# Patient Record
Sex: Male | Born: 1935 | Race: White | Hispanic: No | Smoking: Former smoker
Health system: Southern US, Community
[De-identification: ages and names within clinical notes are randomized; demographics above are authoritative.]

## PROBLEM LIST (undated history)

## (undated) DIAGNOSIS — R351 Nocturia: Secondary | ICD-10-CM

## (undated) DIAGNOSIS — G4700 Insomnia, unspecified: Secondary | ICD-10-CM

## (undated) DIAGNOSIS — R001 Bradycardia, unspecified: Secondary | ICD-10-CM

## (undated) DIAGNOSIS — K5900 Constipation, unspecified: Secondary | ICD-10-CM

## (undated) DIAGNOSIS — I499 Cardiac arrhythmia, unspecified: Secondary | ICD-10-CM

## (undated) DIAGNOSIS — R200 Anesthesia of skin: Secondary | ICD-10-CM

## (undated) DIAGNOSIS — C801 Malignant (primary) neoplasm, unspecified: Secondary | ICD-10-CM

## (undated) DIAGNOSIS — Z860100 Personal history of colon polyps, unspecified: Secondary | ICD-10-CM

## (undated) DIAGNOSIS — M1990 Unspecified osteoarthritis, unspecified site: Secondary | ICD-10-CM

## (undated) DIAGNOSIS — R531 Weakness: Secondary | ICD-10-CM

## (undated) DIAGNOSIS — M2540 Effusion, unspecified joint: Secondary | ICD-10-CM

## (undated) DIAGNOSIS — G8929 Other chronic pain: Secondary | ICD-10-CM

## (undated) DIAGNOSIS — N400 Enlarged prostate without lower urinary tract symptoms: Secondary | ICD-10-CM

## (undated) DIAGNOSIS — H3530 Unspecified macular degeneration: Secondary | ICD-10-CM

## (undated) DIAGNOSIS — Z87442 Personal history of urinary calculi: Secondary | ICD-10-CM

## (undated) DIAGNOSIS — I10 Essential (primary) hypertension: Secondary | ICD-10-CM

## (undated) DIAGNOSIS — M549 Dorsalgia, unspecified: Secondary | ICD-10-CM

## (undated) HISTORY — PX: PARATHYROIDECTOMY: SHX19

## (undated) HISTORY — PX: OTHER SURGICAL HISTORY: SHX169

## (undated) HISTORY — PX: HERNIA REPAIR: SHX51

## (undated) HISTORY — PX: COLONOSCOPY: SHX174

---

## 2002-10-30 DIAGNOSIS — C801 Malignant (primary) neoplasm, unspecified: Secondary | ICD-10-CM

## 2002-10-30 HISTORY — PX: OTHER SURGICAL HISTORY: SHX169

## 2002-10-30 HISTORY — DX: Malignant (primary) neoplasm, unspecified: C80.1

## 2003-03-16 ENCOUNTER — Encounter: Payer: Self-pay | Admitting: Urology

## 2003-03-16 ENCOUNTER — Encounter: Admission: RE | Admit: 2003-03-16 | Discharge: 2003-03-16 | Payer: Self-pay | Admitting: Urology

## 2003-04-21 ENCOUNTER — Inpatient Hospital Stay (HOSPITAL_COMMUNITY): Admission: RE | Admit: 2003-04-21 | Discharge: 2003-04-25 | Payer: Self-pay | Admitting: Urology

## 2003-04-21 ENCOUNTER — Encounter (INDEPENDENT_AMBULATORY_CARE_PROVIDER_SITE_OTHER): Payer: Self-pay | Admitting: *Deleted

## 2003-10-19 ENCOUNTER — Encounter: Admission: RE | Admit: 2003-10-19 | Discharge: 2003-10-19 | Payer: Self-pay | Admitting: Urology

## 2004-04-07 ENCOUNTER — Ambulatory Visit (HOSPITAL_COMMUNITY): Admission: RE | Admit: 2004-04-07 | Discharge: 2004-04-07 | Payer: Self-pay | Admitting: Urology

## 2004-09-29 ENCOUNTER — Encounter: Admission: RE | Admit: 2004-09-29 | Discharge: 2004-09-29 | Payer: Self-pay | Admitting: Urology

## 2005-04-06 ENCOUNTER — Encounter: Admission: RE | Admit: 2005-04-06 | Discharge: 2005-04-06 | Payer: Self-pay | Admitting: Urology

## 2005-09-20 ENCOUNTER — Encounter: Admission: RE | Admit: 2005-09-20 | Discharge: 2005-09-20 | Payer: Self-pay | Admitting: Urology

## 2006-10-10 ENCOUNTER — Ambulatory Visit (HOSPITAL_COMMUNITY): Admission: RE | Admit: 2006-10-10 | Discharge: 2006-10-10 | Payer: Self-pay | Admitting: Urology

## 2006-10-30 HISTORY — PX: OTHER SURGICAL HISTORY: SHX169

## 2007-02-05 ENCOUNTER — Inpatient Hospital Stay (HOSPITAL_COMMUNITY): Admission: RE | Admit: 2007-02-05 | Discharge: 2007-02-06 | Payer: Self-pay | Admitting: Neurosurgery

## 2007-04-01 ENCOUNTER — Ambulatory Visit (HOSPITAL_COMMUNITY): Admission: RE | Admit: 2007-04-01 | Discharge: 2007-04-01 | Payer: Self-pay | Admitting: Urology

## 2007-09-03 ENCOUNTER — Ambulatory Visit: Payer: Self-pay | Admitting: Internal Medicine

## 2007-09-03 ENCOUNTER — Encounter: Payer: Self-pay | Admitting: Internal Medicine

## 2007-09-03 ENCOUNTER — Ambulatory Visit (HOSPITAL_COMMUNITY): Admission: RE | Admit: 2007-09-03 | Discharge: 2007-09-03 | Payer: Self-pay | Admitting: Internal Medicine

## 2007-09-23 ENCOUNTER — Ambulatory Visit (HOSPITAL_COMMUNITY): Admission: RE | Admit: 2007-09-23 | Discharge: 2007-09-23 | Payer: Self-pay | Admitting: Urology

## 2008-04-10 ENCOUNTER — Ambulatory Visit (HOSPITAL_COMMUNITY): Admission: RE | Admit: 2008-04-10 | Discharge: 2008-04-10 | Payer: Self-pay | Admitting: Urology

## 2011-03-14 NOTE — Op Note (Signed)
NAME:  Joshua Downs, Joshua Downs                  ACCOUNT NO.:  [id number]   MEDICAL RECORD NO.:  [id number]          PATIENT TYPE:  AMB   LOCATION:  DAY                           FACILITY:  [facility]   PHYSICIAN:  R. Roetta Sessions, M.D. DATE OF BIRTH:  February 05, 1931   DATE OF PROCEDURE:  09/03/2007  DATE OF DISCHARGE:                               OPERATIVE REPORT   PROCEDURE PERFORMED:  Colonoscopy with biopsy.   INDICATIONS FOR PROCEDURE:  77 year old gentleman sent over for  screening colonoscopy.  He is devoid of any lower GI lower tract  symptoms.  He has never had his lower GI tract imaged.  There is no  family history of colorectal neoplasia.  Colonoscopy is now being done  as a screening maneuver.  This approach has been discussed with the  patient at length.  The potential risks, benefits and alternatives have  been reviewed, questions answered, he is agreeable.  Please see  documentation in the medical record.   PROCEDURE NOTE:  O2 saturation, blood pressure, pulse rate, and oximetry  were monitored throughout the entire procedure.  Conscious sedation with  Versed 2 mg IV and Demerol 50 mg IV in a single dose.  Instrument Pentax  video chip system.   FINDINGS:  Digital rectal exam revealed no abnormalities.  The prep was  good.  The colonic mucosa was surveyed from the rectosigmoid junction  through the left, transverse, right colon to the appendiceal orifice,  ileocecal valve and cecum.  These structures were well seen and  photographed for the record.  From this level, the scope was slowly  withdrawn.  All previously mentioned mucosal surfaces were again seen.  Careful attention to fold flattening and flexure was utilized to see all  the colonic mucosa.  The patient had scattered narrow mouth sigmoid  diverticula and a 4 mm polyp on a fold in the mid transverse colon which  was cold biopsied/removed.  The remainder of the colonic mucosa appeared  normal.  The scope was pulled down in the  rectum where a thorough  examination of the rectal mucosa including a retroflex view of the anal  verge demonstrated no abnormalities.  The patient tolerated the  procedure well and was reacted in endoscopy.   IMPRESSION:  1. Normal rectum.  2. Sigmoid diverticula (few), diminutive polyp transverse colon      removed as described above, otherwise, normal colon.   RECOMMENDATIONS:  1. Diverticulosis literature provided to Mr. Nordin.  2. Follow up on path.  3. Further recommendations to follow.      Jonathon Bellows, M.D.  Electronically Signed     RMR/MEDQ  D:  09/03/2007  T:  09/03/2007  Job:  [id number]

## 2011-03-17 NOTE — Op Note (Signed)
NAMEMARCK, MCCLENNY NO.:  [id number]   MEDICAL RECORD NO.:  [id number]          PATIENT TYPE:  INP   LOCATION:  3172                         FACILITY:  [facility]   PHYSICIAN:  Danae Orleans. Venetia Maxon, M.D.  DATE OF BIRTH:  1931-06-29   DATE OF PROCEDURE:  02/05/2007  DATE OF DISCHARGE:                               OPERATIVE REPORT   PREOPERATIVE DIAGNOSIS:  L4-5 herniated lumbar disk with left L4  radiculopathy, spondylosis, degenerative disk disease and stenosis with  spondylolisthesis.   FINAL DIAGNOSIS:  L4-5 herniated lumbar disk with left L4 radiculopathy,  spondylosis, degenerative disk disease and stenosis with  spondylolisthesis.   PROCEDURE:  Left L4-5 laminectomy with microdiskectomy and  microdissection.   SURGEON:  Danae Orleans. Venetia Maxon, M.D.   ASSISTANT:  Dr. Channing Mutters and Poteat, RN   ANESTHESIA:  General endotracheal anesthesia.   ESTIMATE BLOOD LOSS:  Minimal.   COMPLICATIONS:  None.   DISPOSITION:  Recovery.   INDICATIONS:  Joshua Downs is a 77 year old man with severe lumbar  spondylosis and stenosis at L3-4 with spondylolisthesis at L3 and L4  with a free fragment disk herniation of what appears to be from the L4-5  level lodged behind the left L4 nerve root.  It was elected take him to  surgery for removal of the free fragment of herniated disk material.   PROCEDURE:  Joshua Downs was brought to the operating room.  Following  satisfactory and uncomplicated induction of general endotracheal  anesthesia and placement of intravenous lines, he was placed in the  prone position on the Wilson frame.  His low back was then prepped and  draped in the usual sterile fashion.  The area of the planned incision  was infiltrated with 0.25% Marcaine and 0.5% lidocaine with 1:200,000  epinephrine.  Incision was made overlying the L4-5 interspace and  carried sharply to the lumbodorsal fascia.  The left side of midline was  incised and subperiosteal dissection was  performed after confirmatory x-  ray was obtained with a marker probe at the L4-5 level.  A  hemisemilaminectomy of L4 was performed with high-speed drill and  completed with Kerrison rongeur.  Ligamentum flavum was detached and  removed in piecemeal fashion.  Microscope was brought into the field and  using the microdissection technique the thecal sac was mobilized and  cephalad to the interspace a large fragment of herniated disk material  was identified and removed using micropituitary.  The L4 nerve root was  felt to be well decompressed.  Hemostasis was assured with Gelfoam  soaked in thrombin.  The spinal canal was palpated and was felt to be  well decompressed.  The wound was infiltrated with Depo-Medrol and  fentanyl.  The self-retaining retractor was removed.  The lumbodorsal  fascia was closed with 0 Vicryl sutures, subcutaneous tissues  reapproximated with 2-0 Vicryl interrupted inverted sutures, and skin  edges reapproximated with interrupted 3-0  Vicryl subcuticular stitch.  The wound was dressed with Dermabond.  The  patient was extubated in the operating room and was taken to the  recovery in stable  satisfactory condition having tolerated the operation  well.  Counts were correct at the end of the case.      Danae Orleans. Venetia Maxon, M.D.  Electronically Signed     JDS/MEDQ  D:  02/05/2007  T:  02/05/2007  Job:  [id number]

## 2011-03-17 NOTE — Op Note (Signed)
NAME:  Joshua Downs, Joshua Downs                            ACCOUNT NO.:  [id number]   MEDICAL RECORD NO.:  [id number]                   PATIENT TYPE:  INP   LOCATION:  0007                                 FACILITY:  [facility]   PHYSICIAN:  Bertram Millard. Dahlstedt, M.D.          DATE OF BIRTH:  September 29, 1931   DATE OF PROCEDURE:  04/21/2003  DATE OF DISCHARGE:                                 OPERATIVE REPORT   PREOPERATIVE DIAGNOSIS:  Left renal mass.   POSTOPERATIVE DIAGNOSIS:  Renal cell carcinoma of left kidney.   PRINCIPAL PROCEDURE:  Hand-assisted laparoscopic left partial nephrectomy.   SURGEON:  Bertram Millard. Dahlstedt, M.D.   FIRST ASSISTANT:  Excell Seltzer. Annabell Howells, M.D.   ANESTHESIA:  General endotracheal.   COMPLICATIONS:  None.   BRIEF HISTORY:  This is a 77 year old male, who has an incidentally  discovered left renal mass.  He has had a staging evaluation which is  negative.  This is a 2-3 cm mass which partially enhances with IV contrast.  The patient was given treatment options and has chosen to have an  extirpative procedure on his left renal mass.  He aware of risks and  complications of surgery including but not limited to bleeding, the need to  open surgical procedure, injury to surrounding organs, infection, possible  need for blood replacement.  The patient understand these and desires to  proceed.   DESCRIPTION OF PROCEDURE:  The patient was given preoperative IV antibiotics  and taken to the operating room where general anesthetic was administered.  He was placed in the supine position on a bean bag.  His anterior abdomen  was prepped and draped.  A Foley catheter had been placed.  A 6 cm  subumbilical incision was made in the midline and carried down through  fascia layers to the peritoneum.  The lap-disk was then placed.  Separate  trocar sites were placed just to the left of the midline in the upper  abdomen, and a small 5 mm trocar was placed in the left upper quadrant.  The  colon was then taken down on the left side along with peritoneal reflection.  Attachments between the colon and the spleen were also taken down.  In this  manner, it was quite easy to palpate the kidney underneath.  The anterior  surface of Gerota's fascia was identified and the colon reflected medially.  The lower pole of the kidney was then identified, and the small mass was  palpable through Gerota's fascia.  The perinephric fat was then excised in  this area over top of the small mass.  The mass was circumscribed down to  the capsule.  The capsule was then scored with electrocautery.  The harmonic  blade was then used to dissect underneath of this mass through the renal  parenchyma.  We tried to take what looked to be adequate margins at this  point.  The tumor  mass was then sent for frozen section.  We then placed  FloSeal on the resected defect.  This provided adequate hemostasis, but the  frozen section came back as renal cell carcinoma with two small areas of  positive margin.  We then dissected deeper underneath of the defect to get  extra tissue.  This was sent for permanent section.  Once again, FloSeal was  placed on the defect.  Hemostasis was adequate.  Tisseal and Surgicel were  placed over top of the defect as well.  There was excellent hemostasis.  No  other bleeding was seen throughout the abdomen.  At this point, a drain was  placed through the 5 mm port in the left upper quadrant.  This was sutured  to the skin with 3-0 nylon.  The trocar sites were inspected from inside  after the trocars were removed.  No bleeding was seen.  The trocar sites  were closed with staples.  The fascial layer was closed in the midline using  a #1 running PDS.  Staples were placed on the skin.  Dry sterile dressings  were placed.   The patient tolerated the procedure well.  He was awakened, extubated, and  taken to the PACU in stable condition.                                                Bertram Millard. Dahlstedt, M.D.    SMD/MEDQ  D:  04/21/2003  T:  04/21/2003  Job:  [id number]   cc:   Excell Seltzer. Annabell Howells, M.D.  [address]  [place]  [place] [address]  Fax: [phone]

## 2011-03-17 NOTE — Discharge Summary (Signed)
Joshua Downs, Joshua Downs                            ACCOUNT NO.:  [id number]   MEDICAL RECORD NO.:  [id number]                   PATIENT TYPE:  INP   LOCATION:  0367                                 FACILITY:  [facility]   PHYSICIAN:  Excell Seltzer. Annabell Howells, M.D.                 DATE OF BIRTH:  [birth year]   DATE OF ADMISSION:  04/21/2003  DATE OF DISCHARGE:  04/25/2003                                 DISCHARGE SUMMARY   HISTORY:  Briefly, Joshua Downs is a 77 year old white male who was originally  seen in consultation by Dr. Wyvonnia Lora for a left renal mass. It was found  on an MRI for back pain. A CT scan demonstrated a 2.4 cm exophytic mass off  the lower pole of the left kidney and after discussing the options Joshua Downs  has agreed to hand assisted laparoscopic partial left nephrectomy. For  additional details of his past medical history and physical exam, please see  the office notes in the chart.   ACCESSORY CLINICAL INFORMATION:  Admission labs, white count 3.9, hemoglobin  14.4, PT 12.7, PTT 30, chemistries within normal limits. Blood type A  positive, antibody negative. EKG marked sinus bradycardia with left anterior  fascicular block.   HOSPITAL COURSE:  On the day of admission, the patient was taken to the  operating room where he underwent a left hand assisted partial nephrectomy  by Dr. Annabell Howells and Dr. Retta Diones. He tolerated the procedure well and was left  with a Foley catheter and a Blake drain. His intraoperative frozen section  on initial specimen showed a positive margin so additional margin was  obtained. His postoperative course was without major complications. His  Foley catheter was discontinued on the first postoperative day. He was able  to void without difficulty. He had some moderate Jackson-Pratt drainage that  was eventually tested for creatinine and found to be negative on the second  postoperative day. On the second postoperative day, he was a little bit  queasy, he had  minimal flatus, his abdomen was somewhat distended but had  positive bowel sounds. His pathology returned grade 2/4 renal cell carcinoma  with the final margin negative. He was given Dulcolax suppositories to  improve his bowel function and his diet which originally had been advanced  back to clears on the morning of the second postoperative day. On the  evening of the second postoperative day, he was made n.p.o. for persistent  ileus. On the third postoperative day, he denied  flatus, he was afebrile,  his abdomen remained somewhat distended but with increased bowel sounds. His  Jackson-Pratt was removed. He was started back on clear liquids and given an  additional Dulcolax. On the evening of the third postoperative day, he was  passing flatus, his abdomen was softer, he was increased to a regular diet.  The following morning he was felt to be ready  for discharge home. At that  time with  a final diagnosis of left renal cell carcinoma. His admission was  complicated by postoperative ileus. His discharge medications consisted of  Vicodin 1-2 p.o. q. 4-6h p.r.n. pain, Dulcolax suppositories. He was  instructed to followup with me in one week.   CONDITION ON DISCHARGE:  Improved.    PROGNOSIS:  Good.   FINAL DIAGNOSES:  Renal cell carcinoma.                                               Excell Seltzer. Annabell Howells, M.D.    JJW/MEDQ  D:  05/07/2003  T:  05/07/2003  Job:  [id number]   cc:   Wyvonnia Lora  [address]  [place]  [place] [address]  Fax: [phone]

## 2012-09-03 ENCOUNTER — Other Ambulatory Visit: Payer: Self-pay | Admitting: Neurosurgery

## 2012-09-03 DIAGNOSIS — M5137 Other intervertebral disc degeneration, lumbosacral region: Secondary | ICD-10-CM

## 2012-09-03 DIAGNOSIS — M47817 Spondylosis without myelopathy or radiculopathy, lumbosacral region: Secondary | ICD-10-CM

## 2012-09-03 DIAGNOSIS — M545 Low back pain: Secondary | ICD-10-CM

## 2012-09-10 ENCOUNTER — Ambulatory Visit
Admission: RE | Admit: 2012-09-10 | Discharge: 2012-09-10 | Disposition: A | Discharge status: Home / Self Care | Payer: Medicare Other | Source: Ambulatory Visit | Attending: Neurosurgery | Admitting: Neurosurgery

## 2012-09-10 DIAGNOSIS — M5137 Other intervertebral disc degeneration, lumbosacral region: Secondary | ICD-10-CM

## 2012-09-10 DIAGNOSIS — M47817 Spondylosis without myelopathy or radiculopathy, lumbosacral region: Secondary | ICD-10-CM

## 2012-09-10 DIAGNOSIS — M545 Low back pain: Secondary | ICD-10-CM

## 2012-09-10 MED ORDER — GADOBENATE DIMEGLUMINE 529 MG/ML IV SOLN
10.0000 mL | Freq: Once | INTRAVENOUS | Status: AC | PRN
  Administered 2012-09-10: 10 mL via INTRAVENOUS

## 2012-09-18 ENCOUNTER — Other Ambulatory Visit: Payer: Self-pay | Admitting: Neurosurgery

## 2012-10-18 ENCOUNTER — Inpatient Hospital Stay (HOSPITAL_COMMUNITY): Admission: RE | Admit: 2012-10-18 | Payer: Medicare Other | Source: Ambulatory Visit

## 2012-10-30 HISTORY — PX: BACK SURGERY: SHX140

## 2012-10-31 ENCOUNTER — Ambulatory Visit (HOSPITAL_COMMUNITY)
Admission: RE | Admit: 2012-10-31 | Discharge: 2012-10-31 | Disposition: A | Discharge status: Home / Self Care | Payer: Medicare Other | Source: Ambulatory Visit | Attending: Neurosurgery | Admitting: Neurosurgery

## 2012-10-31 ENCOUNTER — Encounter (HOSPITAL_COMMUNITY): Payer: Self-pay

## 2012-10-31 ENCOUNTER — Encounter (HOSPITAL_COMMUNITY)
Admission: RE | Admit: 2012-10-31 | Discharge: 2012-10-31 | Disposition: A | Discharge status: Home / Self Care | Payer: Medicare Other | Source: Ambulatory Visit | Attending: Neurosurgery | Admitting: Neurosurgery

## 2012-10-31 DIAGNOSIS — Z01818 Encounter for other preprocedural examination: Secondary | ICD-10-CM | POA: Insufficient documentation

## 2012-10-31 HISTORY — DX: Bradycardia, unspecified: R00.1

## 2012-10-31 HISTORY — DX: Unspecified osteoarthritis, unspecified site: M19.90

## 2012-10-31 HISTORY — DX: Essential (primary) hypertension: I10

## 2012-10-31 HISTORY — DX: Malignant (primary) neoplasm, unspecified: C80.1

## 2012-10-31 LAB — BASIC METABOLIC PANEL
BUN: 21 mg/dL (ref 6–23)
CO2: 27 mEq/L (ref 19–32)
Calcium: 9.2 mg/dL (ref 8.4–10.5)
Chloride: 100 mEq/L (ref 96–112)
Creatinine, Ser: 1.23 mg/dL (ref 0.50–1.35)
GFR calc Af Amer: 63 mL/min — ABNORMAL LOW (ref >90)
GFR calc non Af Amer: 54 mL/min — ABNORMAL LOW (ref >90)
Glucose, Bld: 110 mg/dL — ABNORMAL HIGH (ref 70–99)
Potassium: 4.3 mEq/L (ref 3.5–5.1)
Sodium: 138 mEq/L (ref 135–145)

## 2012-10-31 LAB — TYPE AND SCREEN
ABO/RH(D): A POS
Antibody Screen: NEGATIVE

## 2012-10-31 LAB — CBC
HCT: 42.4 % (ref 39.0–52.0)
Hemoglobin: 14.1 g/dL (ref 13.0–17.0)
MCH: 29.7 pg (ref 26.0–34.0)
MCHC: 33.3 g/dL (ref 30.0–36.0)
MCV: 89.3 fL (ref 78.0–100.0)
Platelets: 217 10*3/uL (ref 150–400)
RBC: 4.75 MIL/uL (ref 4.22–5.81)
RDW: 12.8 % (ref 11.5–15.5)
WBC: 4.7 10*3/uL (ref 4.0–10.5)

## 2012-10-31 LAB — ABO/RH: ABO/RH(D): A POS

## 2012-10-31 LAB — SURGICAL PCR SCREEN
MRSA, PCR: NEGATIVE
Staphylococcus aureus: NEGATIVE

## 2012-10-31 NOTE — Pre-Procedure Instructions (Signed)
20 AALIYAH CANCRO  10/31/2012   Your procedure is scheduled on:  Thursday, January 9th  Report to [facility] at      AM.  Call this number if you have problems the morning of surgery: [phone]   Remember:   Do not eat food or drink:After Midnight.   Take these medicines the morning of surgery with A SIP OF WATER: percocet if needed   Do not wear jewelry, make-up or nail polish.  Do not wear lotions, powders, or perfumes.   Do not shave 48 hours prior to surgery. Men may shave face and neck.  Do not bring valuables to the hospital.  Contacts, dentures or bridgework may not be worn into surgery.  Leave suitcase in the car. After surgery it may be brought to your room.  For patients admitted to the hospital, checkout time is 11:00 AM the day of discharge.   Patients discharged the day of surgery will not be allowed to drive home.    Special Instructions: Shower using CHG 2 nights before surgery and the night before surgery.  If you shower the day of surgery use CHG.  Use special wash - you have one bottle of CHG for all showers.  You should use approximately 1/3 of the bottle for each shower.   Please read over the following fact sheets that you were given: Pain Booklet, Coughing and Deep Breathing, Blood Transfusion Information, MRSA Information and Surgical Site Infection Prevention

## 2012-10-31 NOTE — Progress Notes (Signed)
10/31/12 0929  OBSTRUCTIVE SLEEP APNEA  Have you ever been diagnosed with sleep apnea through a sleep study? No  Do you snore loudly (loud enough to be heard through closed doors)?  1  Do you often feel tired, fatigued, or sleepy during the daytime? 0  Has anyone observed you stop breathing during your sleep? 0  Do you have, or are you being treated for high blood pressure? 1  BMI more than 35 kg/m2? 0  Age over 77 years old? 1  Neck circumference greater than 40 cm/18 inches? 0  Gender: 1  Obstructive Sleep Apnea Score 4   Score 4 or greater  Results sent to PCP

## 2012-10-31 NOTE — Progress Notes (Signed)
Primary Physican - Dr. Margo Common, Jonita Albee Does not have a cardiologist  No previous cardiac testing

## 2012-11-04 MED ORDER — KETOROLAC TROMETHAMINE 30 MG/ML IJ SOLN
INTRAMUSCULAR | Status: AC
  Filled 2012-11-04: qty 1

## 2012-11-06 MED ORDER — CEFAZOLIN SODIUM-DEXTROSE 2-3 GM-% IV SOLR
2.0000 g | INTRAVENOUS | Status: AC
  Administered 2012-11-07 (×2): 2 g via INTRAVENOUS
  Filled 2012-11-06: qty 50

## 2012-11-07 ENCOUNTER — Encounter (HOSPITAL_COMMUNITY): Payer: Self-pay | Admitting: Certified Registered"

## 2012-11-07 ENCOUNTER — Inpatient Hospital Stay (HOSPITAL_COMMUNITY): Payer: Medicare Other | Admitting: Certified Registered"

## 2012-11-07 ENCOUNTER — Inpatient Hospital Stay (HOSPITAL_COMMUNITY): Payer: Medicare Other

## 2012-11-07 ENCOUNTER — Encounter (HOSPITAL_COMMUNITY)
Admission: RE | Disposition: A | Discharge status: Home / Self Care | Payer: Self-pay | Source: Ambulatory Visit | Attending: Neurosurgery

## 2012-11-07 ENCOUNTER — Inpatient Hospital Stay (HOSPITAL_COMMUNITY)
Admission: RE | Admit: 2012-11-07 | Discharge: 2012-11-09 | DRG: 460 | Disposition: A | Discharge status: Home / Self Care | Payer: Medicare Other | Source: Ambulatory Visit | Attending: Neurosurgery | Admitting: Neurosurgery

## 2012-11-07 ENCOUNTER — Encounter (HOSPITAL_COMMUNITY): Payer: Self-pay | Admitting: Vascular Surgery

## 2012-11-07 DIAGNOSIS — M51379 Other intervertebral disc degeneration, lumbosacral region without mention of lumbar back pain or lower extremity pain: Secondary | ICD-10-CM | POA: Diagnosis present

## 2012-11-07 DIAGNOSIS — I10 Essential (primary) hypertension: Secondary | ICD-10-CM | POA: Diagnosis present

## 2012-11-07 DIAGNOSIS — Z85528 Personal history of other malignant neoplasm of kidney: Secondary | ICD-10-CM

## 2012-11-07 DIAGNOSIS — M5137 Other intervertebral disc degeneration, lumbosacral region: Secondary | ICD-10-CM | POA: Diagnosis present

## 2012-11-07 DIAGNOSIS — M4310 Spondylolisthesis, site unspecified: Principal | ICD-10-CM | POA: Diagnosis present

## 2012-11-07 LAB — GLUCOSE, CAPILLARY: Glucose-Capillary: 118 mg/dL — ABNORMAL HIGH (ref 70–99)

## 2012-11-07 SURGERY — POSTERIOR LUMBAR FUSION 2 LEVEL
Anesthesia: General | Site: Back | Wound class: Clean

## 2012-11-07 MED ORDER — HEMOSTATIC AGENTS (NO CHARGE) OPTIME
TOPICAL | Status: DC | PRN
  Administered 2012-11-07 (×2): 1 via TOPICAL

## 2012-11-07 MED ORDER — OXYCODONE HCL 5 MG PO TABS: 5.0000 mg | ORAL_TABLET | Freq: Once | ORAL | Status: DC | PRN

## 2012-11-07 MED ORDER — SODIUM CHLORIDE 0.9 % IV SOLN
INTRAVENOUS | Status: AC
  Filled 2012-11-07: qty 500

## 2012-11-07 MED ORDER — 0.9 % SODIUM CHLORIDE (POUR BTL) OPTIME
TOPICAL | Status: DC | PRN
  Administered 2012-11-07: 1000 mL

## 2012-11-07 MED ORDER — HYDROXYZINE HCL 50 MG/ML IM SOLN: 50.0000 mg | INTRAMUSCULAR | Status: DC | PRN

## 2012-11-07 MED ORDER — CYCLOBENZAPRINE HCL 10 MG PO TABS: 10.0000 mg | ORAL_TABLET | Freq: Three times a day (TID) | ORAL | Status: DC | PRN

## 2012-11-07 MED ORDER — OXYCODONE HCL 5 MG PO TABS
5.0000 mg | ORAL_TABLET | ORAL | Status: DC | PRN
  Administered 2012-11-08 – 2012-11-09 (×2): 5 mg via ORAL
  Filled 2012-11-07 (×2): qty 1

## 2012-11-07 MED ORDER — SODIUM CHLORIDE 0.9 % IJ SOLN: 3.0000 mL | INTRAMUSCULAR | Status: DC | PRN

## 2012-11-07 MED ORDER — OXYCODONE HCL 5 MG/5ML PO SOLN: 5.0000 mg | Freq: Once | ORAL | Status: DC | PRN

## 2012-11-07 MED ORDER — NEOSTIGMINE METHYLSULFATE 1 MG/ML IJ SOLN
INTRAMUSCULAR | Status: DC | PRN
  Administered 2012-11-07: 5 mg via INTRAVENOUS

## 2012-11-07 MED ORDER — PHENOL 1.4 % MT LIQD: 1.0000 | OROMUCOSAL | Status: DC | PRN

## 2012-11-07 MED ORDER — ARTIFICIAL TEARS OP OINT
TOPICAL_OINTMENT | OPHTHALMIC | Status: DC | PRN
  Administered 2012-11-07: 1 via OPHTHALMIC

## 2012-11-07 MED ORDER — ACETAMINOPHEN 10 MG/ML IV SOLN
1000.0000 mg | Freq: Four times a day (QID) | INTRAVENOUS | Status: AC
  Administered 2012-11-07 – 2012-11-08 (×4): 1000 mg via INTRAVENOUS
  Filled 2012-11-07 (×4): qty 100

## 2012-11-07 MED ORDER — ALBUMIN HUMAN 5 % IV SOLN
INTRAVENOUS | Status: DC | PRN
  Administered 2012-11-07 (×2): via INTRAVENOUS

## 2012-11-07 MED ORDER — SODIUM CHLORIDE 0.9 % IV SOLN: 250.0000 mL | INTRAVENOUS | Status: DC

## 2012-11-07 MED ORDER — FENTANYL CITRATE 0.05 MG/ML IJ SOLN
INTRAMUSCULAR | Status: DC | PRN
  Administered 2012-11-07: 150 ug via INTRAVENOUS
  Administered 2012-11-07 (×3): 50 ug via INTRAVENOUS
  Administered 2012-11-07: 100 ug via INTRAVENOUS
  Administered 2012-11-07 (×2): 50 ug via INTRAVENOUS

## 2012-11-07 MED ORDER — BENAZEPRIL HCL 20 MG PO TABS
20.0000 mg | ORAL_TABLET | Freq: Every day | ORAL | Status: DC
  Administered 2012-11-08: 20 mg via ORAL
  Filled 2012-11-07 (×2): qty 1

## 2012-11-07 MED ORDER — ALUM & MAG HYDROXIDE-SIMETH 200-200-20 MG/5ML PO SUSP: 30.0000 mL | Freq: Four times a day (QID) | ORAL | Status: DC | PRN

## 2012-11-07 MED ORDER — ONDANSETRON HCL 4 MG/2ML IJ SOLN: 4.0000 mg | Freq: Once | INTRAMUSCULAR | Status: DC | PRN

## 2012-11-07 MED ORDER — KETOROLAC TROMETHAMINE 30 MG/ML IJ SOLN
30.0000 mg | Freq: Once | INTRAMUSCULAR | Status: AC
  Administered 2012-11-07: 30 mg via INTRAVENOUS

## 2012-11-07 MED ORDER — ACETAMINOPHEN 650 MG RE SUPP: 650.0000 mg | RECTAL | Status: DC | PRN

## 2012-11-07 MED ORDER — KETOROLAC TROMETHAMINE 30 MG/ML IJ SOLN
30.0000 mg | Freq: Four times a day (QID) | INTRAMUSCULAR | Status: DC
  Administered 2012-11-07 – 2012-11-09 (×6): 30 mg via INTRAVENOUS
  Filled 2012-11-07 (×7): qty 1

## 2012-11-07 MED ORDER — HYDROCHLOROTHIAZIDE 12.5 MG PO CAPS
12.5000 mg | ORAL_CAPSULE | Freq: Every day | ORAL | Status: DC
  Administered 2012-11-08: 12.5 mg via ORAL
  Filled 2012-11-07 (×2): qty 1

## 2012-11-07 MED ORDER — EPHEDRINE SULFATE 50 MG/ML IJ SOLN
INTRAMUSCULAR | Status: DC | PRN
  Administered 2012-11-07 (×4): 10 mg via INTRAVENOUS

## 2012-11-07 MED ORDER — SODIUM CHLORIDE 0.9 % IR SOLN
Status: DC | PRN
  Administered 2012-11-07: 11:00:00

## 2012-11-07 MED ORDER — PROPOFOL 10 MG/ML IV BOLUS
INTRAVENOUS | Status: DC | PRN
  Administered 2012-11-07: 100 mg via INTRAVENOUS

## 2012-11-07 MED ORDER — SODIUM CHLORIDE 0.9 % IJ SOLN
3.0000 mL | Freq: Two times a day (BID) | INTRAMUSCULAR | Status: DC
  Administered 2012-11-08: 3 mL via INTRAVENOUS

## 2012-11-07 MED ORDER — BENAZEPRIL-HYDROCHLOROTHIAZIDE 20-12.5 MG PO TABS: 1.0000 | ORAL_TABLET | Freq: Every day | ORAL | Status: DC

## 2012-11-07 MED ORDER — CEFAZOLIN SODIUM-DEXTROSE 2-3 GM-% IV SOLR
INTRAVENOUS | Status: AC
  Filled 2012-11-07: qty 50

## 2012-11-07 MED ORDER — MEPERIDINE HCL 25 MG/ML IJ SOLN: 6.2500 mg | INTRAMUSCULAR | Status: DC | PRN

## 2012-11-07 MED ORDER — MAGNESIUM HYDROXIDE 400 MG/5ML PO SUSP: 30.0000 mL | Freq: Every day | ORAL | Status: DC | PRN

## 2012-11-07 MED ORDER — ROCURONIUM BROMIDE 100 MG/10ML IV SOLN
INTRAVENOUS | Status: DC | PRN
  Administered 2012-11-07 (×2): 10 mg via INTRAVENOUS
  Administered 2012-11-07: 50 mg via INTRAVENOUS

## 2012-11-07 MED ORDER — MENTHOL 3 MG MT LOZG: 1.0000 | LOZENGE | OROMUCOSAL | Status: DC | PRN

## 2012-11-07 MED ORDER — HYDROXYZINE HCL 25 MG PO TABS: 50.0000 mg | ORAL_TABLET | ORAL | Status: DC | PRN

## 2012-11-07 MED ORDER — GLYCOPYRROLATE 0.2 MG/ML IJ SOLN
INTRAMUSCULAR | Status: DC | PRN
  Administered 2012-11-07: 0.6 mg via INTRAVENOUS

## 2012-11-07 MED ORDER — KETOROLAC TROMETHAMINE 30 MG/ML IJ SOLN
INTRAMUSCULAR | Status: AC
  Filled 2012-11-07: qty 1

## 2012-11-07 MED ORDER — LIDOCAINE HCL (CARDIAC) 20 MG/ML IV SOLN
INTRAVENOUS | Status: DC | PRN
  Administered 2012-11-07: 100 mg via INTRAVENOUS

## 2012-11-07 MED ORDER — KCL IN DEXTROSE-NACL 20-5-0.45 MEQ/L-%-% IV SOLN
INTRAVENOUS | Status: DC
  Administered 2012-11-07: 21:00:00 via INTRAVENOUS
  Filled 2012-11-07 (×6): qty 1000

## 2012-11-07 MED ORDER — ACETAMINOPHEN 10 MG/ML IV SOLN
INTRAVENOUS | Status: AC
  Administered 2012-11-07: 1000 mg via INTRAVENOUS
  Filled 2012-11-07: qty 100

## 2012-11-07 MED ORDER — PHENYLEPHRINE HCL 10 MG/ML IJ SOLN
INTRAMUSCULAR | Status: DC | PRN
  Administered 2012-11-07: 40 ug via INTRAVENOUS

## 2012-11-07 MED ORDER — ACETAMINOPHEN 325 MG PO TABS: 650.0000 mg | ORAL_TABLET | ORAL | Status: DC | PRN

## 2012-11-07 MED ORDER — BACITRACIN 50000 UNITS IM SOLR
INTRAMUSCULAR | Status: AC
  Filled 2012-11-07: qty 1

## 2012-11-07 MED ORDER — SODIUM CHLORIDE 0.9 % IV SOLN
INTRAVENOUS | Status: DC | PRN
  Administered 2012-11-07 (×2): via INTRAVENOUS

## 2012-11-07 MED ORDER — BISACODYL 10 MG RE SUPP: 10.0000 mg | Freq: Every day | RECTAL | Status: DC | PRN

## 2012-11-07 MED ORDER — ZOLPIDEM TARTRATE 5 MG PO TABS: 5.0000 mg | ORAL_TABLET | Freq: Every evening | ORAL | Status: DC | PRN

## 2012-11-07 MED ORDER — MORPHINE SULFATE 4 MG/ML IJ SOLN: 4.0000 mg | INTRAMUSCULAR | Status: DC | PRN

## 2012-11-07 MED ORDER — THROMBIN 20000 UNITS EX SOLR
CUTANEOUS | Status: DC | PRN
  Administered 2012-11-07 (×2): via TOPICAL

## 2012-11-07 MED ORDER — MIDAZOLAM HCL 5 MG/5ML IJ SOLN
INTRAMUSCULAR | Status: DC | PRN
  Administered 2012-11-07: 2 mg via INTRAVENOUS

## 2012-11-07 MED ORDER — LACTATED RINGERS IV SOLN
INTRAVENOUS | Status: DC | PRN
  Administered 2012-11-07 (×3): via INTRAVENOUS

## 2012-11-07 MED ORDER — BUPIVACAINE HCL (PF) 0.5 % IJ SOLN
INTRAMUSCULAR | Status: DC | PRN
  Administered 2012-11-07: 30 mL

## 2012-11-07 MED ORDER — HYDROMORPHONE HCL PF 1 MG/ML IJ SOLN: 0.2500 mg | INTRAMUSCULAR | Status: DC | PRN

## 2012-11-07 MED ORDER — LIDOCAINE-EPINEPHRINE 1 %-1:100000 IJ SOLN
INTRAMUSCULAR | Status: DC | PRN
  Administered 2012-11-07: 30 mL

## 2012-11-07 SURGICAL SUPPLY — 88 items
ADH SKN CLS APL DERMABOND .7 (GAUZE/BANDAGES/DRESSINGS) ×1
APL SKNCLS STERI-STRIP NONHPOA (GAUZE/BANDAGES/DRESSINGS)
BAG DECANTER FOR FLEXI CONT (MISCELLANEOUS) ×2 IMPLANT
BENZOIN TINCTURE PRP APPL 2/3 (GAUZE/BANDAGES/DRESSINGS) IMPLANT
BLADE SURG ROTATE 9660 (MISCELLANEOUS) IMPLANT
BRUSH SCRUB EZ PLAIN DRY (MISCELLANEOUS) ×2 IMPLANT
BUR ACORN 6.0 ACORN (BURR) ×2 IMPLANT
BUR ACRON 5.0MM COATED (BURR) ×2 IMPLANT
BUR MATCHSTICK NEURO 3.0 LAGG (BURR) ×2 IMPLANT
CANISTER SUCTION 2500CC (MISCELLANEOUS) ×2 IMPLANT
CAP LCK SPNE (Orthopedic Implant) ×6 IMPLANT
CAP LOCK SPINE RADIUS (Orthopedic Implant) ×6 IMPLANT
CAP LOCKING (Orthopedic Implant) ×12 IMPLANT
CLOTH BEACON ORANGE TIMEOUT ST (SAFETY) ×2 IMPLANT
CONT SPEC 4OZ CLIKSEAL STRL BL (MISCELLANEOUS) ×4 IMPLANT
COVER BACK TABLE 24X17X13 BIG (DRAPES) IMPLANT
COVER TABLE BACK 60X90 (DRAPES) ×2 IMPLANT
CROSSLINK MEDIUM (Orthopedic Implant) ×2 IMPLANT
DERMABOND ADVANCED (GAUZE/BANDAGES/DRESSINGS) ×1
DERMABOND ADVANCED .7 DNX12 (GAUZE/BANDAGES/DRESSINGS) ×1 IMPLANT
DRAPE C-ARM 42X72 X-RAY (DRAPES) ×4 IMPLANT
DRAPE LAPAROTOMY 100X72X124 (DRAPES) ×2 IMPLANT
DRAPE POUCH INSTRU U-SHP 10X18 (DRAPES) ×2 IMPLANT
DRAPE PROXIMA HALF (DRAPES) ×2 IMPLANT
DRSG EMULSION OIL 3X3 NADH (GAUZE/BANDAGES/DRESSINGS) IMPLANT
DURAFORM COLLAGEN 1X1 5-PACK (Neuro Prosthesis/Implant) ×2 IMPLANT
ELECT REM PT RETURN 9FT ADLT (ELECTROSURGICAL) ×2
ELECTRODE REM PT RTRN 9FT ADLT (ELECTROSURGICAL) ×1 IMPLANT
GAUZE SPONGE 4X4 16PLY XRAY LF (GAUZE/BANDAGES/DRESSINGS) ×2 IMPLANT
GLOVE BIO SURGEON STRL SZ8.5 (GLOVE) ×1 IMPLANT
GLOVE BIOGEL PI IND STRL 8 (GLOVE) ×2 IMPLANT
GLOVE BIOGEL PI INDICATOR 8 (GLOVE) ×2
GLOVE ECLIPSE 7.5 STRL STRAW (GLOVE) ×9 IMPLANT
GLOVE EXAM NITRILE LRG STRL (GLOVE) ×2 IMPLANT
GLOVE EXAM NITRILE MD LF STRL (GLOVE) IMPLANT
GLOVE EXAM NITRILE XL STR (GLOVE) IMPLANT
GLOVE EXAM NITRILE XS STR PU (GLOVE) IMPLANT
GLOVE INDICATOR 7.5 STRL GRN (GLOVE) ×1 IMPLANT
GLOVE INDICATOR 8.0 STRL GRN (GLOVE) ×6 IMPLANT
GLOVE OPTIFIT SS 8.0 STRL (GLOVE) ×2 IMPLANT
GLOVE OPTIFIT SS 8.5 STRL (GLOVE) ×2 IMPLANT
GOWN BRE IMP SLV AUR LG STRL (GOWN DISPOSABLE) IMPLANT
GOWN BRE IMP SLV AUR XL STRL (GOWN DISPOSABLE) ×6 IMPLANT
GOWN STRL REIN 2XL LVL4 (GOWN DISPOSABLE) ×4 IMPLANT
KIT BASIN OR (CUSTOM PROCEDURE TRAY) ×2 IMPLANT
KIT INFUSE SMALL (Orthopedic Implant) ×2 IMPLANT
KIT ROOM TURNOVER OR (KITS) ×2 IMPLANT
MILL MEDIUM DISP (BLADE) ×2 IMPLANT
NDL 18GX1X1/2 (RX/OR ONLY) (NEEDLE) ×1 IMPLANT
NDL SPNL 18GX3.5 QUINCKE PK (NEEDLE) ×1 IMPLANT
NEEDLE 18GX1X1/2 (RX/OR ONLY) (NEEDLE) ×2 IMPLANT
NEEDLE BONE MARROW 8GAX6 (NEEDLE) ×1 IMPLANT
NEEDLE SPNL 18GX3.5 QUINCKE PK (NEEDLE) ×2 IMPLANT
NEEDLE SPNL 22GX3.5 QUINCKE BK (NEEDLE) ×2 IMPLANT
NS IRRIG 1000ML POUR BTL (IV SOLUTION) ×4 IMPLANT
PACK LAMINECTOMY NEURO (CUSTOM PROCEDURE TRAY) ×2 IMPLANT
PAD ARMBOARD 7.5X6 YLW CONV (MISCELLANEOUS) ×6 IMPLANT
PATTIES SURGICAL .5 X.5 (GAUZE/BANDAGES/DRESSINGS) ×2 IMPLANT
PATTIES SURGICAL .5 X1 (DISPOSABLE) IMPLANT
PATTIES SURGICAL 1X1 (DISPOSABLE) ×1 IMPLANT
PEEK PLIF AVS 10X25X4 (Peek) ×2 IMPLANT
PEEK PLIF AVS 12X25X4 (Peek) ×4 IMPLANT
PEEK PLIF AVS 8X25X4 DEGREE (Peek) ×4 IMPLANT
ROD 5.5X60MM GREEN (Rod) ×2 IMPLANT
ROD 70MM (Rod) ×4 IMPLANT
ROD SPNL 70X5.5XNS TI RDS (Rod) ×2 IMPLANT
SCREW 5.75X40M (Screw) ×2 IMPLANT
SCREW 5.75X50MM (Screw) ×8 IMPLANT
SPONGE GAUZE 4X4 12PLY (GAUZE/BANDAGES/DRESSINGS) ×2 IMPLANT
SPONGE LAP 4X18 X RAY DECT (DISPOSABLE) ×2 IMPLANT
SPONGE NEURO XRAY DETECT 1X3 (DISPOSABLE) IMPLANT
SPONGE SURGIFOAM ABS GEL 100 (HEMOSTASIS) ×4 IMPLANT
STAPLER SKIN PROX WIDE 3.9 (STAPLE) IMPLANT
STRIP BIOACTIVE VITOSS 25X100X (Neuro Prosthesis/Implant) ×2 IMPLANT
STRIP CLOSURE SKIN 1/2X4 (GAUZE/BANDAGES/DRESSINGS) IMPLANT
SUT PROLENE 6 0 BV (SUTURE) ×4 IMPLANT
SUT VIC AB 1 CT1 18XBRD ANBCTR (SUTURE) ×2 IMPLANT
SUT VIC AB 1 CT1 8-18 (SUTURE) ×4
SUT VIC AB 2-0 CP2 18 (SUTURE) ×4 IMPLANT
SYR 20CC LL (SYRINGE) ×2 IMPLANT
SYR 5ML LL (SYRINGE) IMPLANT
SYR CONTROL 10ML LL (SYRINGE) ×2 IMPLANT
TAPE CLOTH SURG 4X10 WHT LF (GAUZE/BANDAGES/DRESSINGS) ×1 IMPLANT
TOWEL OR 17X24 6PK STRL BLUE (TOWEL DISPOSABLE) ×2 IMPLANT
TOWEL OR 17X26 10 PK STRL BLUE (TOWEL DISPOSABLE) ×2 IMPLANT
TRAP SPECIMEN MUCOUS 40CC (MISCELLANEOUS) IMPLANT
TRAY FOLEY CATH 14FRSI W/METER (CATHETERS) ×2 IMPLANT
WATER STERILE IRR 1000ML POUR (IV SOLUTION) ×2 IMPLANT

## 2012-11-07 NOTE — Anesthesia Postprocedure Evaluation (Signed)
Anesthesia Post Note  Patient: Joshua Downs  Procedure(s) Performed: Procedure(s) (LRB): POSTERIOR LUMBAR FUSION 2 LEVEL (N/A)  Anesthesia type: general  Patient location: PACU  Post pain: Pain level controlled  Post assessment: Patient's Cardiovascular Status Stable  Last Vitals:  Filed Vitals:   11/07/12 1548  Temp: 36.1 C    Post vital signs: Reviewed and stable  Level of consciousness: sedated  Complications: No apparent anesthesia complications

## 2012-11-07 NOTE — Transfer of Care (Signed)
Immediate Anesthesia Transfer of Care Note  Patient: Joshua Downs  Procedure(s) Performed: Procedure(s) (LRB) with comments: POSTERIOR LUMBAR FUSION 2 LEVEL (N/A) - Lumbar three-five laminectomies with Lumbar three-four Lumbar four-five posterior lumbar interbody fusion with interbody prothesis posterolateral arthrodesis and posterior segmentation instrumentation  Patient Location: PACU  Anesthesia Type:General  Level of Consciousness: awake and alert   Airway & Oxygen Therapy: Patient connected to face mask oxygen  Post-op Assessment: Report given to PACU RN  Post vital signs: stable  Complications: No apparent anesthesia complications

## 2012-11-07 NOTE — Anesthesia Preprocedure Evaluation (Addendum)
Anesthesia Evaluation  Patient identified by MRN, date of birth, ID band Patient awake    Reviewed: Allergy & Precautions, H&P , NPO status , Patient's Chart, lab work & pertinent test results, reviewed documented beta blocker date and time   Airway Mallampati: I TM Distance: >3 FB Neck ROM: Full    Dental  (+) Edentulous Upper, Edentulous Lower and Dental Advisory Given   Pulmonary  breath sounds clear to auscultation        Cardiovascular hypertension, Pt. on medications Rhythm:Regular Rate:Normal     Neuro/Psych    GI/Hepatic   Endo/Other    Renal/GU      Musculoskeletal   Abdominal (+)  Abdomen: soft. Bowel sounds: normal.  Peds  Hematology   Anesthesia Other Findings   Reproductive/Obstetrics                         Anesthesia Physical Anesthesia Plan  ASA: II  Anesthesia Plan: General   Post-op Pain Management:    Induction: Intravenous  Airway Management Planned: Oral ETT  Additional Equipment:   Intra-op Plan:   Post-operative Plan: Extubation in OR  Informed Consent: I have reviewed the patients History and Physical, chart, labs and discussed the procedure including the risks, benefits and alternatives for the proposed anesthesia with the patient or authorized representative who has indicated his/her understanding and acceptance.     Plan Discussed with: CRNA and Surgeon  Anesthesia Plan Comments:         Anesthesia Quick Evaluation

## 2012-11-07 NOTE — Anesthesia Procedure Notes (Signed)
Procedure Name: Intubation Date/Time: 11/07/2012 10:27 AM Performed by: Ellin Goodie Pre-anesthesia Checklist: Patient identified, Emergency Drugs available, Suction available, Patient being monitored and Timeout performed Patient Re-evaluated:Patient Re-evaluated prior to inductionOxygen Delivery Method: Circle system utilized Preoxygenation: Pre-oxygenation with 100% oxygen Intubation Type: IV induction Ventilation: Mask ventilation without difficulty Laryngoscope Size: Mac and 3 Grade View: Grade I Tube type: Oral Tube size: 7.5 mm Number of attempts: 1 Airway Equipment and Method: Stylet Placement Confirmation: ETT inserted through vocal cords under direct vision,  positive ETCO2 and breath sounds checked- equal and bilateral Secured at: 22 cm Tube secured with: Tape Dental Injury: Teeth and Oropharynx as per pre-operative assessment

## 2012-11-07 NOTE — Op Note (Signed)
11/07/2012  3:28 PM  PATIENT:  Joshua Downs  79 y.o. male  PRE-OPERATIVE DIAGNOSIS:  lumbar stenosis, lumbar spondylolisthesis, lumbar spondylosis, lumbar degenerative disc disease  POST-OPERATIVE DIAGNOSIS:  lumbar stenosis, lumbar spondylolisthesis, lumbar spondylosis, lumbar degenerative disc disease  PROCEDURE:  Procedure(s): POSTERIOR LUMBAR FUSION 2 LEVEL: L3-L5 decompressive lumbar laminectomy, bilateral L3-4 and L4-5 facetectomy and foraminotomies, with decompression of the exiting L3, L4, and L5 nerve roots bilaterally with decompression beyond that required for interbody arthrodesis, with microdissection and microsurgical technique, bilateral L3-4 and L4-5 posterior lumbar interbody arthrodesis with AVS peek interbody implants and locally harvested morcellized autograft, and the bilateral L3-L5 radius posterior arthrodesis and Vitoss BA with bone marrow aspirate and infuse   SURGEON:  Surgeon(s): Hewitt Shorts, MD Cristi Loron, MD  ASSISTANTS: [name], M.D.   ANESTHESIA:   general  EBL:  Total I/O In: 3510 [I.V.:2500; Blood:510; IV Piggyback:500] Out: 1345 [Urine:145; Blood:1200]  BLOOD ADMINISTERED:510 CC CELLSAVER  COUNT: CORRECT PER NURSING STAFF   DICTATION: Patient was brought to the operating room placed under general endotracheal anesthesia. The patient was turned to prone position, the lumbar region was prepped with Betadine soap and solution and draped in a sterile fashion. The midline was infiltrated with local anesthesia with epinephrine. A midline incision was made, and included the previous midline incision that had been done for a left L4-5 lumbar laminotomy and microdiscectomy Dr. Venetia Maxon 6 years ago, and carried down through the subcutaneous tissue, bipolar cautery and electrocautery were used to maintain hemostasis. Dissection was carried down to the lumbar fascia. The fascia was incised bilaterally and the paraspinal muscles were dissected with a  spinous process and lamina in a subperiosteal fashion. Care was taken typically on the left side at L4-5 to the previous laminotomy, and the presence of scar tissue. An x-ray was taken for localization and the L3, L4, and L5 levels were localized. Dissection was then carried out laterally over the facet complexes and the transverse processes of L3, L4, and L5 were exposed and decorticated. Decompression was performed by performing a L3, L4, and L5 laminectomy, and a bilateral L3-4 and L4-5 facetectomy, and foraminotomy using the high-speed drill and Kerrison punches. The bone from the laminectomy was set aside, and later cleaned of soft tissue, and morselized in a bone mill to later be used as bone graft implant (autograft). Dissection was carried out laterally including the facetectomies and foraminotomies with decompression of the stenotic compression of the L3, L4, and L5 nerve roots. We did find a synovial cyst on the left side at L3-4 and this was removed using microdissection microsurgical technique as well. In the area of the previous left L4-5 laminotomy there was scar tissue adherent to the dura, and a small rent in the dorsal aspect the dura occurred. This was closed using 6-0 Prolene suture in a figure-of-eight fashion. Good approximation the dura was achieved, the patient was Valsalva'd and no CSF leakage was seen, and at the end of the case, a piece of dura foam was placed over the dural repair. Once the decompression of the stenotic compression of the thecal sac and exiting nerve roots was completed we proceeded with the posterior lumbar interbody arthrodesis. The annulus at each level was incised bilaterally and the disc space entered. A thorough discectomy was performed using pituitary rongeurs and curettes. Once the discectomy was completed we began to prepare the endplate surfaces, removing the cartilaginous endplates surface with micro-curettes. We then measured the height of the intervertebral  disc space. We selected a 12 x 25 x 4 AVS peek interbody implants for the L3-4 level, and 8 x 25 x 4 AVS peek interbody implants for the L4-5 level.  The C-arm fluoroscope was then draped and brought in the field and we identified the pedicle entry points bilaterally at the L3, L4, and L5 levels. Each of the 6 pedicles was probed, we aspirated bone marrow aspirate from the vertebral bodies, this was injected over a 10 cc strip of Vitoss BA. Then each of the pedicles was examined with the ball probe, good bony surfaces were found and no bony cuts were found. Each of the pedicles was then tapped with a 5.25 mm tap, again examined with the ball probe good threading was found and no bony cuts were found. We then placed 5.75 by 50 millimeter screws bilaterally at the L3 level, 5.75 by 50 millimeter screws bilaterally at the L4 level, and 5.75 by 40 millimeter screws bilaterally at the L5 level.  We then packed the AVS peek interbody implants with locally harvested morcellized autograft, and then placed the first implant at the right level on the right side, carefully retracting the thecal sac and nerve root medially. We then went back to the left side and packed the midline with additional locally harvested morcellized autograft and then placed a second implant on the left side again retracting the thecal sac and nerve root medially. Then at the L4-5 level, we placed the first implant on the right side, carefully retracting the thecal sac and nerve root medially. We then went back to the left side and packed the midline with additional locally harvested morcellized autograft and then placed a second implant on the left side, again retracting the thecal sac and nerve root medially.   We then packed the lateral gutter over the transverse processes and intertransverse space with Vitoss BA with bone marrow aspirate and infuse. We then selected a 70 mm pre-lordosed rods, they were placed within the screw heads and  secured with locking caps once all 6 locking caps were placed final tightening was performed against a counter torque.  The wound had been irrigated multiple times during the procedure with saline solution and bacitracin solution, good hemostasis was established with a combination of bipolar cautery and Gelfoam with thrombin. Once good hemostasis was confirmed we proceeded with closure paraspinal muscles deep fascia and Scarpa's fascia were closed with interrupted undyed 1 Vicryl sutures the subcutaneous and subcuticular closed with interrupted inverted 2-0 undyed Vicryl sutures the skin edges were approximated with Dermabond.  Following surgery the patient was turned back to the supine position to be reversed and the anesthetic extubated and transferred to the recovery room for further care.    PLAN OF CARE: Admit to inpatient   PATIENT DISPOSITION:  PACU - hemodynamically stable.   Delay start of Pharmacological VTE agent (>24hrs) due to surgical blood loss or risk of bleeding:  yes

## 2012-11-07 NOTE — Preoperative (Signed)
Beta Blockers   Reason not to administer Beta Blockers:Not Applicable 

## 2012-11-07 NOTE — H&P (Signed)
Subjective: Patient is a 79 y.o. male who is admitted for treatment of multilevel multifactorial lumbar stenosis at the L3-4 and L4-5 levels, secondary to dynamic degenerative grade 1 spondylolisthesis at each level associated with advanced degenerative disease and spondylosis. Patient has had difficulties with back pain rating down to the right lower extremity to the buttock, posterior thigh, and leg. He status post a previous left L4-5 lumbar discectomy by Dr. Venetia Maxon in April 2008. He's undergone numerous epidural steroid injections with Dr. Murray Hodgkins. He is admitted now for an L3-L5 decompressive lumbar laminectomy, and a 2 level LIII-4 and L4-5 posterior lumbar interbody arthrodesis with interbody implants and bone graft, and a bilateral L3-L5 posterior lumbar arthrodesis with posterior instrumentation and bone graft.   Past Medical History  Diagnosis Date  . Hypertension   . Bradycardia   . Kidney stones   . Cancer 2004    left kidney  . Arthritis     Past Surgical History  Procedure Date  . Hernia repair   . Parathyroidectomy   . Left kidney portion removed 2004  . Back surgery     Prescriptions prior to admission  Medication Sig Dispense Refill  . benazepril-hydrochlorthiazide (LOTENSIN HCT) 20-12.5 MG per tablet Take 1 tablet by mouth daily.      . beta carotene w/minerals (OCUVITE) tablet Take 1 tablet by mouth daily.      . Multiple Vitamins-Minerals (CENTRUM PO) Take 1 tablet by mouth daily.      . Omega-3 Fatty Acids (FISH OIL) 1200 MG CAPS Take 1 capsule by mouth 2 (two) times daily.      Marland Kitchen oxyCODONE-acetaminophen (PERCOCET/ROXICET) 5-325 MG per tablet Take 1 tablet by mouth 2 (two) times daily as needed. For pain       No Known Allergies  History  Substance Use Topics  . Smoking status: Never Smoker   . Smokeless tobacco: Current User    Types: Chew  . Alcohol Use: No    No family history on file.   Review of Systems A comprehensive review of systems was  negative.  Objective: Vital signs in last 24 hours:    EXAM: Patient is a well-developed well-nourished white male in no acute distress. Lungs are clear to auscultation , the patient has symmetrical respiratory excursion. Heart has a regular rate and rhythm normal S1 and S2 no murmur.   Abdomen is soft nontender nondistended bowel sounds are present. Extremity examination shows no clubbing cyanosis or edema. Musculoskeletal examination shows noticed a patient of the lumbar spinous processes or paralumbar musculature. He is able to flex to degrees, and able to extend to 10. Straight leg raising is negative bilaterally. Motor examination shows 5 over 5 strength in the lower extremities including the iliopsoas quadriceps dorsiflexor extensor hallicus  longus and plantar flexor bilaterally. Sensation is intact to pinprick in the distal lower extremities. Reflexes are symmetrical bilaterally. No pathologic reflexes are present. Patient has a normal gait and stance.   Data Review:CBC    Component Value Date/Time   WBC 4.7 10/31/2012 0948   RBC 4.75 10/31/2012 0948   HGB 14.1 10/31/2012 0948   HCT 42.4 10/31/2012 0948   PLT 217 10/31/2012 0948   MCV 89.3 10/31/2012 0948   MCH 29.7 10/31/2012 0948   MCHC 33.3 10/31/2012 0948   RDW 12.8 10/31/2012 0948                          BMET    Component  Value Date/Time   NA 138 10/31/2012 0948   K 4.3 10/31/2012 0948   CL 100 10/31/2012 0948   CO2 27 10/31/2012 0948   GLUCOSE 110* 10/31/2012 0948   BUN 21 10/31/2012 0948   CREATININE 1.23 10/31/2012 0948   CALCIUM 9.2 10/31/2012 0948   GFRNONAA 54* 10/31/2012 0948   GFRAA 63* 10/31/2012 0948     Assessment/Plan: Patient with low back and right lumbar radicular pain with advanced degeneration at L3-4 and L4-5 levels as outlined above. He is admitted now for lumbar decompression and arthrodesis as outlined above. I've discussed with the patient the nature of his condition, the nature the surgical procedure, the typical length of  surgery, hospital stay, and overall recuperation, the limitations postoperatively, and risks of surgery. I discussed risks including risks of infection, bleeding, possibly need for transfusion, the risk of nerve root dysfunction with pain, weakness, numbness, or paresthesias, the risk of dural tear and CSF leakage and possible need for further surgery, the risk of failure of the arthrodesis and possibly for further surgery, the risk of anesthetic complications including myocardial infarction, stroke, pneumonia, and death. We discussed the need for postoperative immobilization in a lumbar brace. Understanding all this the patient does wish to proceed with surgery and is admitted for such.     Hewitt Shorts, MD 11/07/2012 7:24 AM

## 2012-11-08 NOTE — Progress Notes (Signed)
Filed Vitals:   11/07/12 1730 11/07/12 2000 11/08/12 0000 11/08/12 0400  BP: 147/65 118/58 128/70 92/57  Pulse: 42 50 81 90  Temp: 97.4 F (36.3 C) 97.9 F (36.6 C) 99.3 F (37.4 C) 99 F (37.2 C)  Resp: 16 16 16 16   SpO2: 100% 97% 98% 97%    Patient comfortable, but anxious to get up and mobilize. Dressing clean and dry. Moving lower extremities well.  Plan: We'll begin out of bed today, and progress. We'll discontinue Foley once up and about.  Hewitt Shorts, MD 11/08/2012, 8:10 AM

## 2012-11-08 NOTE — Progress Notes (Signed)
UR COMPLETED  

## 2012-11-09 MED ORDER — OXYCODONE-ACETAMINOPHEN 5-325 MG PO TABS: 1.0000 | ORAL_TABLET | ORAL | Status: DC | PRN

## 2012-11-09 NOTE — Discharge Summary (Signed)
Physician Discharge Summary  Patient ID: Joshua Downs MRN: [id number] DOB/AGE: January 17, 1931 79 y.o.  Admit date: 11/07/2012 Discharge date: 11/09/2012  Admission Diagnoses:  Lumbar stenosis, lumbar spondylolisthesis, lumbar spondylosis, lumbar degenerative disc disease  Discharge Diagnoses:  Lumbar stenosis, lumbar spondylolisthesis, lumbar spondylosis, lumbar degenerative disc disease of  Discharged Condition:  Good  Hospital Course: Patient was admitted underwent an L3-L5 decompressive lumbar laminectomy, and L3-4 and L4-5 PLIF and PLA. Postoperatively he has done well. She's been up and living. He is voiding well. His incision is healing nicely. He is being discharged home with instructions regarding wound care and activities. He is to followup with me in 3 weeks.  Discharge Exam: Blood pressure 92/45, pulse 92, temperature 98.2 F (36.8 C), resp. rate 18, SpO2 96.00%.  Disposition: Home      Medication List     As of 11/09/2012  8:31 AM    TAKE these medications         benazepril-hydrochlorthiazide 20-12.5 MG per tablet   Commonly known as: LOTENSIN HCT   Take 1 tablet by mouth daily.      beta carotene w/minerals tablet   Take 1 tablet by mouth daily.      CENTRUM PO   Take 1 tablet by mouth daily.      Fish Oil 1200 MG Caps   Take 1 capsule by mouth 2 (two) times daily.      oxyCODONE-acetaminophen 5-325 MG per tablet   Commonly known as: PERCOCET/ROXICET   Take 1 tablet by mouth 2 (two) times daily as needed. For pain      oxyCODONE-acetaminophen 5-325 MG per tablet   Commonly known as: PERCOCET/ROXICET   Take 1-2 tablets by mouth every 4 (four) hours as needed.         Signed: Hewitt Shorts, MD 11/09/2012, 8:31 AM

## 2012-11-09 NOTE — Progress Notes (Signed)
Pt given D/C instructions with verbal understanding. Pt D/C'd home via wheelchair @ 1047 per MD order. Rema Fendt, RN

## 2012-11-11 MED FILL — Sodium Chloride Irrigation Soln 0.9%: Qty: 3000 | Status: AC

## 2012-11-11 MED FILL — Heparin Sodium (Porcine) Inj 1000 Unit/ML: INTRAMUSCULAR | Qty: 30 | Status: AC

## 2012-11-11 MED FILL — Sodium Chloride IV Soln 0.9%: INTRAVENOUS | Qty: 1000 | Status: AC

## 2013-10-18 ENCOUNTER — Emergency Department (HOSPITAL_COMMUNITY): Payer: Medicare Other

## 2013-10-18 ENCOUNTER — Emergency Department (HOSPITAL_COMMUNITY)
Admission: EM | Admit: 2013-10-18 | Discharge: 2013-10-18 | Disposition: A | Discharge status: Home / Self Care | Payer: Medicare Other | Attending: Emergency Medicine | Admitting: Emergency Medicine

## 2013-10-18 ENCOUNTER — Encounter (HOSPITAL_COMMUNITY): Payer: Self-pay | Admitting: Emergency Medicine

## 2013-10-18 DIAGNOSIS — Y929 Unspecified place or not applicable: Secondary | ICD-10-CM | POA: Insufficient documentation

## 2013-10-18 DIAGNOSIS — W010XXA Fall on same level from slipping, tripping and stumbling without subsequent striking against object, initial encounter: Secondary | ICD-10-CM | POA: Insufficient documentation

## 2013-10-18 DIAGNOSIS — IMO0002 Reserved for concepts with insufficient information to code with codable children: Secondary | ICD-10-CM | POA: Insufficient documentation

## 2013-10-18 DIAGNOSIS — M129 Arthropathy, unspecified: Secondary | ICD-10-CM | POA: Insufficient documentation

## 2013-10-18 DIAGNOSIS — W19XXXA Unspecified fall, initial encounter: Secondary | ICD-10-CM

## 2013-10-18 DIAGNOSIS — Y9301 Activity, walking, marching and hiking: Secondary | ICD-10-CM | POA: Insufficient documentation

## 2013-10-18 DIAGNOSIS — Z9089 Acquired absence of other organs: Secondary | ICD-10-CM | POA: Insufficient documentation

## 2013-10-18 DIAGNOSIS — I10 Essential (primary) hypertension: Secondary | ICD-10-CM | POA: Insufficient documentation

## 2013-10-18 DIAGNOSIS — Z79899 Other long term (current) drug therapy: Secondary | ICD-10-CM | POA: Insufficient documentation

## 2013-10-18 DIAGNOSIS — J069 Acute upper respiratory infection, unspecified: Secondary | ICD-10-CM | POA: Insufficient documentation

## 2013-10-18 DIAGNOSIS — Z87442 Personal history of urinary calculi: Secondary | ICD-10-CM | POA: Insufficient documentation

## 2013-10-18 DIAGNOSIS — R000 Tachycardia, unspecified: Secondary | ICD-10-CM | POA: Insufficient documentation

## 2013-10-18 DIAGNOSIS — R5381 Other malaise: Secondary | ICD-10-CM | POA: Insufficient documentation

## 2013-10-18 DIAGNOSIS — Z9889 Other specified postprocedural states: Secondary | ICD-10-CM | POA: Insufficient documentation

## 2013-10-18 DIAGNOSIS — Z85528 Personal history of other malignant neoplasm of kidney: Secondary | ICD-10-CM | POA: Insufficient documentation

## 2013-10-18 NOTE — ED Notes (Signed)
Patient transported to X-ray 

## 2013-10-18 NOTE — ED Notes (Addendum)
Pt fell this morning getting the paper, pt states he tripped, denies hitting head, neck tenderness.  Denies any new pain, had back surgery in January with Dr. Jule Ser.  Was unable to get up without assistance, which is unusual for the patient, and he crawled back to house to call son to help him up.  Denies loc, chest pain, and sob  Pt is a little hoarse and states he is getting over laryngitis.

## 2013-10-18 NOTE — ED Notes (Signed)
Pt unsteady on feet, unsteady gait at discharge.  Notified physician, she is already aware and states that patient had good strength on exam and feels is ok for discharge. Patient has family that will stay with him, assist with ambulation.told to use walker at home.

## 2013-10-18 NOTE — ED Provider Notes (Addendum)
CSN: [id number]     Arrival date & time 10/18/13  5621 History   First MD Initiated Contact with Patient 10/18/13 [phone]     Chief Complaint  Patient presents with  . Fall   (Consider location/radiation/quality/duration/timing/severity/associated sxs/prior Treatment) Patient is a 79 y.o. male presenting with fall. The history is provided by the patient.  Fall This is a new (walking out to get the mail and missed the step and fell on the grass.  was unable to get up after fall and states his legs felt weak and he could not stand on them.) problem. The current episode started 1 to 2 hours ago. The problem occurs constantly. The problem has not changed since onset.Pertinent negatives include no chest pain, no abdominal pain and no shortness of breath. Associated symptoms comments: Unable to stand on his legs because they are weak since the fall. Mild pain in the lower back.  Denies head injury or LOC. The symptoms are aggravated by walking. The symptoms are relieved by rest and lying down. He has tried nothing for the symptoms. The treatment provided no relief.    Past Medical History  Diagnosis Date  . Hypertension   . Bradycardia   . Kidney stones   . Cancer 2004    left kidney  . Arthritis    Past Surgical History  Procedure Laterality Date  . Hernia repair    . Parathyroidectomy    . Left kidney portion removed  2004  . Back surgery    . Back surgery  10/2012   No family history on file. History  Substance Use Topics  . Smoking status: Never Smoker   . Smokeless tobacco: Current User    Types: Chew  . Alcohol Use: No    Review of Systems  Constitutional: Negative for fever and chills.  HENT: Positive for congestion, rhinorrhea, sore throat and voice change.   Respiratory: Positive for cough. Negative for shortness of breath.   Cardiovascular: Negative for chest pain.  Gastrointestinal: Negative for abdominal pain.  Neurological: Negative for dizziness and numbness.  All  other systems reviewed and are negative.    Allergies  Review of patient's allergies indicates no known allergies.  Home Medications   Current Outpatient Rx  Name  Route  Sig  Dispense  Refill  . benazepril-hydrochlorthiazide (LOTENSIN HCT) 20-12.5 MG per tablet   Oral   Take 1 tablet by mouth daily.         . beta carotene w/minerals (OCUVITE) tablet   Oral   Take 1 tablet by mouth daily.         . Multiple Vitamins-Minerals (CENTRUM PO)   Oral   Take 1 tablet by mouth daily.         . Omega-3 Fatty Acids (FISH OIL) 1200 MG CAPS   Oral   Take 1 capsule by mouth 2 (two) times daily.         Marland Kitchen oxyCODONE-acetaminophen (PERCOCET/ROXICET) 5-325 MG per tablet   Oral   Take 1 tablet by mouth 2 (two) times daily as needed. For pain         . oxyCODONE-acetaminophen (PERCOCET/ROXICET) 5-325 MG per tablet   Oral   Take 1-2 tablets by mouth every 4 (four) hours as needed.   60 tablet   0    BP 121/76  Pulse 105  Temp(Src) 98.6 F (37 C) (Oral)  Resp 22  Ht 6' (1.829 m)  Wt 195 lb (88.451 kg)  BMI 26.44 kg/m2  SpO2 93% Physical Exam  Nursing note and vitals reviewed. Constitutional: He is oriented to person, place, and time. He appears well-developed and well-nourished. No distress.  HENT:  Head: Normocephalic and atraumatic.  Mouth/Throat: Oropharynx is clear and moist.  Hoarse voice  Eyes: Conjunctivae and EOM are normal. Pupils are equal, round, and reactive to light.  Neck: Normal range of motion. Neck supple.  Cardiovascular: Regular rhythm and intact distal pulses.  Tachycardia present.   No murmur heard. Pulmonary/Chest: Breath sounds normal. Tachypnea noted. No respiratory distress. He has no wheezes. He has no rales.  Abdominal: Soft. He exhibits no distension. There is no tenderness. There is no rebound and no guarding.  Musculoskeletal: Normal range of motion. He exhibits no edema.       Lumbar back: He exhibits tenderness. He exhibits no  deformity and normal pulse.       Back:  Tenderness to palpation over the paralumbar muscles and must lateral to the lumbar spine.  Pain reproduced in the back when lifting the legs  Neurological: He is alert and oriented to person, place, and time. He has normal strength.  5/5 muscle strength in bilateral legs.  Pt has normal sensation in bilateral legs.    Skin: Skin is warm and dry. No rash noted. No erythema.  Psychiatric: He has a normal mood and affect. His behavior is normal.    ED Course  Procedures (including critical care time) Labs Review Labs Reviewed - No data to display Imaging Review Dg Chest 2 View  10/18/2013   CLINICAL DATA:  Chest congestion.  Low back injury.  EXAM: CHEST  2 VIEW  COMPARISON:  10/31/2012.  FINDINGS: The cardiopericardial silhouette is enlarged. There is no pulmonary edema or airspace disease. Monitoring leads project over the chest. Left glenohumeral osteoarthritis is present. Aortic arch atherosclerosis.  IMPRESSION: Cardiomegaly without failure.   Electronically Signed   By: Andreas Newport M.D.   On: 10/18/2013 09:06   Dg Lumbar Spine Complete  10/18/2013   CLINICAL DATA:  Fall. Tingling in the left leg. Lumbar fusion January 2014.  EXAM: LUMBAR SPINE - COMPLETE 4+ VIEW  COMPARISON:  06/03/2013.  FINDINGS: No change in the L3 through L5 posterior lumbar interbody fusion hardware. Interbody bone graft markers appear similar. The alignment is unchanged. Mild L1-L2 and L2-L3 degenerative disc disease. Lumbosacral alignment appears unchanged as well.  IMPRESSION: Postoperative changes of L3 through L5 PLIF.  No acute abnormality.   Electronically Signed   By: Andreas Newport M.D.   On: 10/18/2013 09:03    EKG Interpretation   None       MDM   1. Fall, initial encounter   2. Acute URI     Patient with a prior history of spinal fusion who has been having some back problems lately his and tingling down the left leg presents today after a mechanical  fall. He states he missed the step when he was walking out to get the mail and fell onto the grass. He states since that time his legs felt weak and he could not stand on his own. He is complaining of mild lower back pain but no excruciating pain in the hips, knees or any other joints. He denies LOC or head injury. On exam patient can lift both legs and has normal strength in his legs. He does have some mild lower back pain reproduced with lifting his legs. He is neurovascularly intact. Pt took hydrocodone this a.m. And states he does not  need anything for pain currently.  Pt had an appt to see Dr. Jule Ser Monday due to some worsening pain with paresthesias going down the left leg. Lumbar films to evaluate L-spine and insure no compression fractures or other acute issues from the fall pending.  Secondly patient has had URI symptoms for the last week with intermittent productive cough, sore throat and hoarse voice. He has an intermittent cough on exam with mild tachypnea and oxygen saturations of 93%. He denies shortness of breath or wheezing.  Chest x-ray pending.  9:19 AM Plain films neg.  Pt able to stand at bedside and states he feels better still feels a little shaky with standing but has a walker he can use at home.  Gwyneth Sprout, MD 10/18/13 1610  Gwyneth Sprout, MD 10/18/13 [phone]

## 2013-10-27 ENCOUNTER — Other Ambulatory Visit (HOSPITAL_COMMUNITY): Payer: Self-pay | Admitting: Neurosurgery

## 2013-10-27 DIAGNOSIS — IMO0002 Reserved for concepts with insufficient information to code with codable children: Secondary | ICD-10-CM

## 2013-10-29 ENCOUNTER — Ambulatory Visit (HOSPITAL_COMMUNITY)
Admission: RE | Admit: 2013-10-29 | Discharge: 2013-10-29 | Disposition: A | Discharge status: Home / Self Care | Payer: Medicare Other | Source: Ambulatory Visit | Attending: Neurosurgery | Admitting: Neurosurgery

## 2013-10-29 DIAGNOSIS — M48061 Spinal stenosis, lumbar region without neurogenic claudication: Secondary | ICD-10-CM | POA: Insufficient documentation

## 2013-10-29 DIAGNOSIS — M545 Low back pain, unspecified: Secondary | ICD-10-CM | POA: Insufficient documentation

## 2013-10-29 DIAGNOSIS — M5126 Other intervertebral disc displacement, lumbar region: Secondary | ICD-10-CM | POA: Insufficient documentation

## 2013-10-29 DIAGNOSIS — Z981 Arthrodesis status: Secondary | ICD-10-CM | POA: Insufficient documentation

## 2013-10-29 DIAGNOSIS — IMO0002 Reserved for concepts with insufficient information to code with codable children: Secondary | ICD-10-CM

## 2013-10-29 MED ORDER — GADOBENATE DIMEGLUMINE 529 MG/ML IV SOLN
18.0000 mL | Freq: Once | INTRAVENOUS | Status: AC | PRN
  Administered 2013-10-29: 18 mL via INTRAVENOUS

## 2013-10-30 HISTORY — PX: BACK SURGERY: SHX140

## 2013-10-31 LAB — POCT I-STAT CREATININE: Creatinine, Ser: 1.3 mg/dL (ref 0.50–1.35)

## 2013-12-09 ENCOUNTER — Other Ambulatory Visit: Payer: Self-pay | Admitting: Neurosurgery

## 2013-12-15 ENCOUNTER — Encounter (HOSPITAL_COMMUNITY): Payer: Self-pay | Admitting: Pharmacy Technician

## 2013-12-18 ENCOUNTER — Encounter (HOSPITAL_COMMUNITY)
Admission: RE | Admit: 2013-12-18 | Discharge: 2013-12-18 | Disposition: A | Discharge status: Home / Self Care | Payer: Medicare Other | Source: Ambulatory Visit | Attending: Neurosurgery | Admitting: Neurosurgery

## 2013-12-18 ENCOUNTER — Encounter (HOSPITAL_COMMUNITY): Payer: Self-pay

## 2013-12-18 DIAGNOSIS — Z01812 Encounter for preprocedural laboratory examination: Secondary | ICD-10-CM | POA: Insufficient documentation

## 2013-12-18 DIAGNOSIS — Z01810 Encounter for preprocedural cardiovascular examination: Secondary | ICD-10-CM | POA: Insufficient documentation

## 2013-12-18 HISTORY — DX: Benign prostatic hyperplasia without lower urinary tract symptoms: N40.0

## 2013-12-18 HISTORY — DX: Personal history of colonic polyps: Z86.010

## 2013-12-18 HISTORY — DX: Nocturia: R35.1

## 2013-12-18 HISTORY — DX: Other chronic pain: G89.29

## 2013-12-18 HISTORY — DX: Effusion, unspecified joint: M25.40

## 2013-12-18 HISTORY — DX: Personal history of urinary calculi: Z87.442

## 2013-12-18 HISTORY — DX: Weakness: R53.1

## 2013-12-18 HISTORY — DX: Unspecified macular degeneration: H35.30

## 2013-12-18 HISTORY — DX: Constipation, unspecified: K59.00

## 2013-12-18 HISTORY — DX: Dorsalgia, unspecified: M54.9

## 2013-12-18 HISTORY — DX: Insomnia, unspecified: G47.00

## 2013-12-18 HISTORY — DX: Personal history of colon polyps, unspecified: Z86.0100

## 2013-12-18 LAB — CBC
HEMATOCRIT: 42.2 % (ref 39.0–52.0)
Hemoglobin: 14.4 g/dL (ref 13.0–17.0)
MCH: 30.6 pg (ref 26.0–34.0)
MCHC: 34.1 g/dL (ref 30.0–36.0)
MCV: 89.6 fL (ref 78.0–100.0)
Platelets: 240 10*3/uL (ref 150–400)
RBC: 4.71 MIL/uL (ref 4.22–5.81)
RDW: 13.5 % (ref 11.5–15.5)
WBC: 5.2 10*3/uL (ref 4.0–10.5)

## 2013-12-18 LAB — BASIC METABOLIC PANEL
BUN: 28 mg/dL — AB (ref 6–23)
CALCIUM: 9.6 mg/dL (ref 8.4–10.5)
CHLORIDE: 102 meq/L (ref 96–112)
CO2: 28 meq/L (ref 19–32)
CREATININE: 1.1 mg/dL (ref 0.50–1.35)
GFR calc Af Amer: 71 mL/min — ABNORMAL LOW (ref >90)
GFR calc non Af Amer: 61 mL/min — ABNORMAL LOW (ref >90)
Glucose, Bld: 121 mg/dL — ABNORMAL HIGH (ref 70–99)
Potassium: 4.5 mEq/L (ref 3.7–5.3)
Sodium: 143 mEq/L (ref 137–147)

## 2013-12-18 LAB — SURGICAL PCR SCREEN
MRSA, PCR: NEGATIVE
Staphylococcus aureus: POSITIVE — AB

## 2013-12-18 LAB — TYPE AND SCREEN
ABO/RH(D): A POS
Antibody Screen: NEGATIVE

## 2013-12-18 NOTE — Progress Notes (Addendum)
  Pt doesn't have a cardiologist  Denies ever having an echo/stress test/heart cath  Denies EKG in past  yr   CXR in epic from 10-18-13  Medical Md is Dr. Matthias Hughs

## 2013-12-18 NOTE — Pre-Procedure Instructions (Signed)
ESCHER HARR  12/18/2013   Your procedure is scheduled on:  Mon, Feb 23 @ 7:30 AM  Report to [facility] A  at 5:30 AM.  Call this number if you have problems the morning of surgery: [phone]   Remember:   Do not eat food or drink liquids after midnight.   Take these medicines the morning of surgery with A SIP OF WATER: Pain Pill(if needed)               Stop taking your Aspirin and Fish Oil. No Goody's,BC's,Aleve,Ibuprofen,or any Herbal Medications   Do not wear jewelry  Do not wear lotions, powders, or colognes. You may wear deodorant.  Men may shave face and neck.  Do not bring valuables to the hospital.  [facility] is not responsible                  for any belongings or valuables.               Contacts, dentures or bridgework may not be worn into surgery.  Leave suitcase in the car. After surgery it may be brought to your room.  For patients admitted to the hospital, discharge time is determined by your                treatment team.                 Special Instructions:  [facility] - Preparing for Surgery  Before surgery, you can play an important role.  Because skin is not sterile, your skin needs to be as free of germs as possible.  You can reduce the number of germs on you skin by washing with CHG (chlorahexidine gluconate) soap before surgery.  CHG is an antiseptic cleaner which kills germs and bonds with the skin to continue killing germs even after washing.  Please DO NOT use if you have an allergy to CHG or antibacterial soaps.  If your skin becomes reddened/irritated stop using the CHG and inform your nurse when you arrive at Short Stay.  Do not shave (including legs and underarms) for at least 48 hours prior to the first CHG shower.  You may shave your face.  Please follow these instructions carefully:   1.  Shower with CHG Soap the night before surgery and the                                morning of Surgery.  2.  If you choose to wash  your hair, wash your hair first as usual with your       normal shampoo.  3.  After you shampoo, rinse your hair and body thoroughly to remove the                      Shampoo.  4.  Use CHG as you would any other liquid soap.  You can apply chg directly       to the skin and wash gently with scrungie or a clean washcloth.  5.  Apply the CHG Soap to your body ONLY FROM THE NECK DOWN.        Do not use on open wounds or open sores.  Avoid contact with your eyes,       ears, mouth and genitals (private parts).  Wash genitals (private parts)       with your  normal soap.  6.  Wash thoroughly, paying special attention to the area where your surgery        will be performed.  7.  Thoroughly rinse your body with warm water from the neck down.  8.  DO NOT shower/wash with your normal soap after using and rinsing off       the CHG Soap.  9.  Pat yourself dry with a clean towel.            10.  Wear clean pajamas.            11.  Place clean sheets on your bed the night of your first shower and do not        sleep with pets.  Day of Surgery  Do not apply any lotions/deoderants the morning of surgery.  Please wear clean clothes to the hospital/surgery center.     Please read over the following fact sheets that you were given: Pain Booklet, Coughing and Deep Breathing, Blood Transfusion Information, MRSA Information and Surgical Site Infection Prevention

## 2013-12-19 NOTE — Progress Notes (Signed)
Anesthesia chart review:  Patient is a 80 year old male scheduled for L2-3 decompression with PLIF on 12/22/13 by Dr. Sherwood Gambler.  History includes non-smoker, HTN, BPH, nephrolithiasis, parathyroidectomy, renal cancer s/p partial left nephrectomy '04, macular degeneration, bradycardia, son get "sick" with anesthesia, hernia repair, L3-5 PLIF 11/07/12.   EKG on 12/18/13 showed SB @ 46 bpm, right BBB, LAFB, bifascicular block, minimal voltage criteria for LVH.  EKG confirmed by cardiologist Dr. Radford Pax who did not feel that it was significantly changed from his last tracing on 10/31/12.  (Right BBB pattern is more evident in septal leads.)  CXR on 10/18/13 showed cardiomegaly without failure.  Preoperative labs noted.  No CV symptoms documented at his PAT visit.  He tolerated PLIF last year, and his EKG was felt overall stable.  Further evaluation by his assigned anesthesiologist on the day of surgery, but if no acute changes then I anticipate that he can proceed as planned.  George Hugh [facility] Stay Center/Anesthesiology Phone [phone] 12/19/2013 9:25 AM

## 2013-12-21 MED ORDER — CEFAZOLIN SODIUM-DEXTROSE 2-3 GM-% IV SOLR
2.0000 g | INTRAVENOUS | Status: AC
  Administered 2013-12-22 (×2): 2 g via INTRAVENOUS
  Filled 2013-12-21: qty 50

## 2013-12-22 ENCOUNTER — Encounter (HOSPITAL_COMMUNITY)
Admission: RE | Disposition: A | Discharge status: Home / Self Care | Payer: Medicare Other | Source: Ambulatory Visit | Attending: Neurosurgery

## 2013-12-22 ENCOUNTER — Encounter (HOSPITAL_COMMUNITY): Payer: Medicare Other | Admitting: Vascular Surgery

## 2013-12-22 ENCOUNTER — Inpatient Hospital Stay (HOSPITAL_COMMUNITY): Payer: Medicare Other | Admitting: Anesthesiology

## 2013-12-22 ENCOUNTER — Inpatient Hospital Stay (HOSPITAL_COMMUNITY): Payer: Medicare Other

## 2013-12-22 ENCOUNTER — Inpatient Hospital Stay (HOSPITAL_COMMUNITY)
Admission: RE | Admit: 2013-12-22 | Discharge: 2013-12-24 | DRG: 460 | Disposition: A | Discharge status: Home / Self Care | Payer: Medicare Other | Source: Ambulatory Visit | Attending: Neurosurgery | Admitting: Neurosurgery

## 2013-12-22 ENCOUNTER — Encounter (HOSPITAL_COMMUNITY): Payer: Self-pay | Admitting: *Deleted

## 2013-12-22 DIAGNOSIS — M48062 Spinal stenosis, lumbar region with neurogenic claudication: Principal | ICD-10-CM | POA: Diagnosis present

## 2013-12-22 DIAGNOSIS — Z860100 Personal history of colon polyps, unspecified: Secondary | ICD-10-CM

## 2013-12-22 DIAGNOSIS — Z87442 Personal history of urinary calculi: Secondary | ICD-10-CM

## 2013-12-22 DIAGNOSIS — Z7982 Long term (current) use of aspirin: Secondary | ICD-10-CM

## 2013-12-22 DIAGNOSIS — H3530 Unspecified macular degeneration: Secondary | ICD-10-CM | POA: Diagnosis present

## 2013-12-22 DIAGNOSIS — Z79899 Other long term (current) drug therapy: Secondary | ICD-10-CM

## 2013-12-22 DIAGNOSIS — F17200 Nicotine dependence, unspecified, uncomplicated: Secondary | ICD-10-CM | POA: Diagnosis present

## 2013-12-22 DIAGNOSIS — G8929 Other chronic pain: Secondary | ICD-10-CM | POA: Diagnosis present

## 2013-12-22 DIAGNOSIS — I10 Essential (primary) hypertension: Secondary | ICD-10-CM | POA: Diagnosis present

## 2013-12-22 DIAGNOSIS — G4700 Insomnia, unspecified: Secondary | ICD-10-CM | POA: Diagnosis present

## 2013-12-22 DIAGNOSIS — M5137 Other intervertebral disc degeneration, lumbosacral region: Secondary | ICD-10-CM | POA: Diagnosis present

## 2013-12-22 DIAGNOSIS — M47817 Spondylosis without myelopathy or radiculopathy, lumbosacral region: Secondary | ICD-10-CM | POA: Diagnosis present

## 2013-12-22 DIAGNOSIS — K5900 Constipation, unspecified: Secondary | ICD-10-CM | POA: Diagnosis present

## 2013-12-22 DIAGNOSIS — M51379 Other intervertebral disc degeneration, lumbosacral region without mention of lumbar back pain or lower extremity pain: Secondary | ICD-10-CM | POA: Diagnosis present

## 2013-12-22 SURGERY — POSTERIOR LUMBAR FUSION 1 LEVEL
Anesthesia: General | Site: Back

## 2013-12-22 MED ORDER — ACETAMINOPHEN 325 MG PO TABS: 650.0000 mg | ORAL_TABLET | ORAL | Status: DC | PRN

## 2013-12-22 MED ORDER — SODIUM CHLORIDE 0.9 % IV SOLN
INTRAVENOUS | Status: DC | PRN
  Administered 2013-12-22: 12:00:00 via INTRAVENOUS

## 2013-12-22 MED ORDER — OXYCODONE-ACETAMINOPHEN 5-325 MG PO TABS
1.0000 | ORAL_TABLET | ORAL | Status: DC | PRN
Start: 2013-12-22 — End: 2013-12-24
  Administered 2013-12-22: 1 via ORAL
  Administered 2013-12-23 – 2013-12-24 (×3): 2 via ORAL
  Filled 2013-12-22: qty 1
  Filled 2013-12-22 (×3): qty 2

## 2013-12-22 MED ORDER — SODIUM CHLORIDE 0.9 % IR SOLN
Status: DC | PRN
  Administered 2013-12-22: 09:00:00

## 2013-12-22 MED ORDER — HYDROCHLOROTHIAZIDE 12.5 MG PO CAPS
12.5000 mg | ORAL_CAPSULE | Freq: Every day | ORAL | Status: DC
  Administered 2013-12-22 – 2013-12-23 (×2): 12.5 mg via ORAL
  Filled 2013-12-22 (×3): qty 1

## 2013-12-22 MED ORDER — SUCCINYLCHOLINE CHLORIDE 20 MG/ML IJ SOLN
INTRAMUSCULAR | Status: AC
  Filled 2013-12-22: qty 1

## 2013-12-22 MED ORDER — ZOLPIDEM TARTRATE 5 MG PO TABS: 5.0000 mg | ORAL_TABLET | Freq: Every evening | ORAL | Status: DC | PRN

## 2013-12-22 MED ORDER — MORPHINE SULFATE 4 MG/ML IJ SOLN: 4.0000 mg | INTRAMUSCULAR | Status: DC | PRN

## 2013-12-22 MED ORDER — DOCUSATE SODIUM 100 MG PO CAPS
100.0000 mg | ORAL_CAPSULE | Freq: Two times a day (BID) | ORAL | Status: DC | PRN
  Filled 2013-12-22: qty 1

## 2013-12-22 MED ORDER — ACETAMINOPHEN 650 MG RE SUPP: 650.0000 mg | RECTAL | Status: DC | PRN

## 2013-12-22 MED ORDER — PHENYLEPHRINE 40 MCG/ML (10ML) SYRINGE FOR IV PUSH (FOR BLOOD PRESSURE SUPPORT)
PREFILLED_SYRINGE | INTRAVENOUS | Status: AC
  Filled 2013-12-22: qty 10

## 2013-12-22 MED ORDER — CYCLOBENZAPRINE HCL 10 MG PO TABS: 10.0000 mg | ORAL_TABLET | Freq: Three times a day (TID) | ORAL | Status: DC | PRN

## 2013-12-22 MED ORDER — KETOROLAC TROMETHAMINE 30 MG/ML IJ SOLN
30.0000 mg | Freq: Four times a day (QID) | INTRAMUSCULAR | Status: DC
  Administered 2013-12-22 – 2013-12-24 (×7): 30 mg via INTRAVENOUS
  Filled 2013-12-22 (×9): qty 1

## 2013-12-22 MED ORDER — PHENOL 1.4 % MT LIQD: 1.0000 | OROMUCOSAL | Status: DC | PRN

## 2013-12-22 MED ORDER — GLYCOPYRROLATE 0.2 MG/ML IJ SOLN
INTRAMUSCULAR | Status: AC
  Filled 2013-12-22: qty 3

## 2013-12-22 MED ORDER — ONDANSETRON HCL 4 MG/2ML IJ SOLN: 4.0000 mg | Freq: Four times a day (QID) | INTRAMUSCULAR | Status: DC | PRN

## 2013-12-22 MED ORDER — ROCURONIUM BROMIDE 50 MG/5ML IV SOLN
INTRAVENOUS | Status: AC
  Filled 2013-12-22: qty 1

## 2013-12-22 MED ORDER — MENTHOL 3 MG MT LOZG: 1.0000 | LOZENGE | OROMUCOSAL | Status: DC | PRN

## 2013-12-22 MED ORDER — BENAZEPRIL HCL 20 MG PO TABS
20.0000 mg | ORAL_TABLET | Freq: Every day | ORAL | Status: DC
  Administered 2013-12-22 – 2013-12-23 (×2): 20 mg via ORAL
  Filled 2013-12-22 (×3): qty 1

## 2013-12-22 MED ORDER — HYDROMORPHONE HCL PF 1 MG/ML IJ SOLN
INTRAMUSCULAR | Status: AC
  Filled 2013-12-22: qty 1

## 2013-12-22 MED ORDER — OXYCODONE-ACETAMINOPHEN 5-325 MG PO TABS
ORAL_TABLET | ORAL | Status: AC
  Filled 2013-12-22: qty 2

## 2013-12-22 MED ORDER — BUPIVACAINE HCL (PF) 0.5 % IJ SOLN
INTRAMUSCULAR | Status: DC | PRN
Start: 2013-12-22 — End: 2013-12-22
  Administered 2013-12-22: 25 mL

## 2013-12-22 MED ORDER — FENTANYL CITRATE 0.05 MG/ML IJ SOLN
INTRAMUSCULAR | Status: DC | PRN
  Administered 2013-12-22 (×3): 50 ug via INTRAVENOUS
  Administered 2013-12-22: 100 ug via INTRAVENOUS
  Administered 2013-12-22 (×2): 50 ug via INTRAVENOUS

## 2013-12-22 MED ORDER — MUPIROCIN 2 % EX OINT: 1.0000 "application " | TOPICAL_OINTMENT | Freq: Two times a day (BID) | CUTANEOUS | Status: AC

## 2013-12-22 MED ORDER — LIDOCAINE HCL (CARDIAC) 20 MG/ML IV SOLN
INTRAVENOUS | Status: AC
  Filled 2013-12-22: qty 5

## 2013-12-22 MED ORDER — EPHEDRINE SULFATE 50 MG/ML IJ SOLN
INTRAMUSCULAR | Status: DC | PRN
  Administered 2013-12-22: 10 mg via INTRAVENOUS
  Administered 2013-12-22: 20 mg via INTRAVENOUS

## 2013-12-22 MED ORDER — ACETAMINOPHEN 10 MG/ML IV SOLN
1000.0000 mg | Freq: Once | INTRAVENOUS | Status: AC
  Administered 2013-12-22: 1000 mg via INTRAVENOUS

## 2013-12-22 MED ORDER — ALBUMIN HUMAN 5 % IV SOLN
INTRAVENOUS | Status: DC | PRN
  Administered 2013-12-22: 10:00:00 via INTRAVENOUS

## 2013-12-22 MED ORDER — BENAZEPRIL-HYDROCHLOROTHIAZIDE 20-12.5 MG PO TABS: 1.0000 | ORAL_TABLET | Freq: Every day | ORAL | Status: DC

## 2013-12-22 MED ORDER — DEXTROSE-NACL 5-0.45 % IV SOLN: INTRAVENOUS | Status: DC

## 2013-12-22 MED ORDER — THROMBIN 5000 UNITS EX SOLR
CUTANEOUS | Status: DC | PRN
  Administered 2013-12-22: 10:00:00 via TOPICAL

## 2013-12-22 MED ORDER — SODIUM CHLORIDE 0.9 % IJ SOLN: 3.0000 mL | INTRAMUSCULAR | Status: DC | PRN

## 2013-12-22 MED ORDER — HYDROMORPHONE HCL PF 1 MG/ML IJ SOLN: 0.2500 mg | INTRAMUSCULAR | Status: DC | PRN

## 2013-12-22 MED ORDER — MAGNESIUM HYDROXIDE 400 MG/5ML PO SUSP: 30.0000 mL | Freq: Every day | ORAL | Status: DC | PRN

## 2013-12-22 MED ORDER — KETOROLAC TROMETHAMINE 30 MG/ML IJ SOLN
INTRAMUSCULAR | Status: AC
  Filled 2013-12-22: qty 1

## 2013-12-22 MED ORDER — THROMBIN 20000 UNITS EX SOLR
CUTANEOUS | Status: DC | PRN
  Administered 2013-12-22: 08:00:00 via TOPICAL

## 2013-12-22 MED ORDER — PROPOFOL 10 MG/ML IV BOLUS
INTRAVENOUS | Status: AC
  Filled 2013-12-22: qty 20

## 2013-12-22 MED ORDER — LIDOCAINE-EPINEPHRINE 1 %-1:100000 IJ SOLN
INTRAMUSCULAR | Status: DC | PRN
  Administered 2013-12-22: 25 mL

## 2013-12-22 MED ORDER — FENTANYL CITRATE 0.05 MG/ML IJ SOLN
INTRAMUSCULAR | Status: AC
  Filled 2013-12-22: qty 5

## 2013-12-22 MED ORDER — ALUM & MAG HYDROXIDE-SIMETH 200-200-20 MG/5ML PO SUSP: 30.0000 mL | Freq: Four times a day (QID) | ORAL | Status: DC | PRN

## 2013-12-22 MED ORDER — NEOSTIGMINE METHYLSULFATE 1 MG/ML IJ SOLN
INTRAMUSCULAR | Status: DC | PRN
  Administered 2013-12-22: 4 mg via INTRAVENOUS

## 2013-12-22 MED ORDER — KETOROLAC TROMETHAMINE 30 MG/ML IJ SOLN
INTRAMUSCULAR | Status: AC
Start: 2013-12-22 — End: 2013-12-23
  Filled 2013-12-22: qty 1

## 2013-12-22 MED ORDER — BISACODYL 5 MG PO TBEC
5.0000 mg | DELAYED_RELEASE_TABLET | Freq: Every day | ORAL | Status: DC | PRN
  Filled 2013-12-22: qty 1

## 2013-12-22 MED ORDER — NEOSTIGMINE METHYLSULFATE 1 MG/ML IJ SOLN
INTRAMUSCULAR | Status: AC
  Filled 2013-12-22: qty 10

## 2013-12-22 MED ORDER — PHENYLEPHRINE HCL 10 MG/ML IJ SOLN
INTRAMUSCULAR | Status: AC
  Filled 2013-12-22: qty 1

## 2013-12-22 MED ORDER — GLYCOPYRROLATE 0.2 MG/ML IJ SOLN
INTRAMUSCULAR | Status: DC | PRN
  Administered 2013-12-22: 0.6 mg via INTRAVENOUS
  Administered 2013-12-22: 0.4 mg via INTRAVENOUS

## 2013-12-22 MED ORDER — ACETAMINOPHEN 10 MG/ML IV SOLN
INTRAVENOUS | Status: AC
  Filled 2013-12-22: qty 100

## 2013-12-22 MED ORDER — LACTATED RINGERS IV SOLN
INTRAVENOUS | Status: DC | PRN
  Administered 2013-12-22 (×4): via INTRAVENOUS

## 2013-12-22 MED ORDER — KETOROLAC TROMETHAMINE 30 MG/ML IJ SOLN
30.0000 mg | Freq: Once | INTRAMUSCULAR | Status: AC
  Administered 2013-12-22: 30 mg via INTRAVENOUS

## 2013-12-22 MED ORDER — LIDOCAINE HCL (CARDIAC) 20 MG/ML IV SOLN
INTRAVENOUS | Status: DC | PRN
  Administered 2013-12-22: 100 mg via INTRAVENOUS

## 2013-12-22 MED ORDER — BISACODYL 10 MG RE SUPP: 10.0000 mg | Freq: Every day | RECTAL | Status: DC | PRN

## 2013-12-22 MED ORDER — DIPHENHYDRAMINE HCL 25 MG PO TABS: 25.0000 mg | ORAL_TABLET | Freq: Every day | ORAL | Status: DC

## 2013-12-22 MED ORDER — SODIUM CHLORIDE 0.9 % IV SOLN: 250.0000 mL | INTRAVENOUS | Status: DC

## 2013-12-22 MED ORDER — HYDROXYZINE HCL 25 MG PO TABS: 50.0000 mg | ORAL_TABLET | ORAL | Status: DC | PRN

## 2013-12-22 MED ORDER — ONDANSETRON HCL 4 MG/2ML IJ SOLN
INTRAMUSCULAR | Status: AC
  Filled 2013-12-22: qty 2

## 2013-12-22 MED ORDER — GLYCOPYRROLATE 0.2 MG/ML IJ SOLN
INTRAMUSCULAR | Status: AC
  Filled 2013-12-22: qty 2

## 2013-12-22 MED ORDER — ONDANSETRON HCL 4 MG/2ML IJ SOLN
INTRAMUSCULAR | Status: DC | PRN
  Administered 2013-12-22: 4 mg via INTRAVENOUS

## 2013-12-22 MED ORDER — SODIUM CHLORIDE 0.9 % IJ SOLN
3.0000 mL | Freq: Two times a day (BID) | INTRAMUSCULAR | Status: DC
  Administered 2013-12-22 – 2013-12-23 (×3): 3 mL via INTRAVENOUS

## 2013-12-22 MED ORDER — HYDROCODONE-ACETAMINOPHEN 5-325 MG PO TABS: 1.0000 | ORAL_TABLET | ORAL | Status: DC | PRN

## 2013-12-22 MED ORDER — OCUVITE PO TABS
1.0000 | ORAL_TABLET | Freq: Every day | ORAL | Status: DC
  Filled 2013-12-22 (×3): qty 1

## 2013-12-22 MED ORDER — ONDANSETRON HCL 4 MG/2ML IJ SOLN: 4.0000 mg | Freq: Once | INTRAMUSCULAR | Status: DC | PRN

## 2013-12-22 MED ORDER — MIDAZOLAM HCL 2 MG/2ML IJ SOLN
INTRAMUSCULAR | Status: AC
  Filled 2013-12-22: qty 2

## 2013-12-22 MED ORDER — EPHEDRINE SULFATE 50 MG/ML IJ SOLN
INTRAMUSCULAR | Status: AC
  Filled 2013-12-22: qty 1

## 2013-12-22 MED ORDER — PROPOFOL 10 MG/ML IV BOLUS
INTRAVENOUS | Status: DC | PRN
  Administered 2013-12-22: 140 mg via INTRAVENOUS

## 2013-12-22 MED ORDER — 0.9 % SODIUM CHLORIDE (POUR BTL) OPTIME
TOPICAL | Status: DC | PRN
  Administered 2013-12-22: 1000 mL

## 2013-12-22 MED ORDER — ROCURONIUM BROMIDE 100 MG/10ML IV SOLN
INTRAVENOUS | Status: DC | PRN
  Administered 2013-12-22: 10 mg via INTRAVENOUS
  Administered 2013-12-22: 50 mg via INTRAVENOUS

## 2013-12-22 SURGICAL SUPPLY — 79 items
ADH SKN CLS APL DERMABOND .7 (GAUZE/BANDAGES/DRESSINGS) ×1
BAG DECANTER FOR FLEXI CONT (MISCELLANEOUS) ×2 IMPLANT
BLADE SURG ROTATE 9660 (MISCELLANEOUS) IMPLANT
BRUSH SCRUB EZ PLAIN DRY (MISCELLANEOUS) ×2 IMPLANT
BUR ACRON 5.0MM COATED (BURR) ×3 IMPLANT
BUR MATCHSTICK NEURO 3.0 LAGG (BURR) ×4 IMPLANT
CANISTER SUCT 3000ML (MISCELLANEOUS) ×2 IMPLANT
CAP LCK SPNE (Orthopedic Implant) ×8 IMPLANT
CAP LOCK SPINE RADIUS (Orthopedic Implant) IMPLANT
CAP LOCKING (Orthopedic Implant) ×16 IMPLANT
CONT SPEC 4OZ CLIKSEAL STRL BL (MISCELLANEOUS) ×2 IMPLANT
COVER BACK TABLE 24X17X13 BIG (DRAPES) IMPLANT
COVER TABLE BACK 60X90 (DRAPES) ×2 IMPLANT
CROSSLINK MEDIUM (Orthopedic Implant) ×2 IMPLANT
DERMABOND ADVANCED (GAUZE/BANDAGES/DRESSINGS) ×1
DERMABOND ADVANCED .7 DNX12 (GAUZE/BANDAGES/DRESSINGS) ×1 IMPLANT
DRAPE C-ARM 42X72 X-RAY (DRAPES) ×4 IMPLANT
DRAPE LAPAROTOMY 100X72X124 (DRAPES) ×2 IMPLANT
DRAPE POUCH INSTRU U-SHP 10X18 (DRAPES) ×2 IMPLANT
DRAPE PROXIMA HALF (DRAPES) ×1 IMPLANT
DRSG EMULSION OIL 3X3 NADH (GAUZE/BANDAGES/DRESSINGS) IMPLANT
ELECT REM PT RETURN 9FT ADLT (ELECTROSURGICAL) ×2
ELECTRODE REM PT RTRN 9FT ADLT (ELECTROSURGICAL) ×1 IMPLANT
GAUZE SPONGE 4X4 16PLY XRAY LF (GAUZE/BANDAGES/DRESSINGS) IMPLANT
GLOVE BIO SURGEON STRL SZ8 (GLOVE) ×1 IMPLANT
GLOVE BIOGEL PI IND STRL 8 (GLOVE) ×2 IMPLANT
GLOVE BIOGEL PI INDICATOR 8 (GLOVE) ×2
GLOVE ECLIPSE 7.5 STRL STRAW (GLOVE) ×5 IMPLANT
GLOVE EXAM NITRILE LRG STRL (GLOVE) IMPLANT
GLOVE EXAM NITRILE MD LF STRL (GLOVE) IMPLANT
GLOVE EXAM NITRILE XL STR (GLOVE) IMPLANT
GLOVE EXAM NITRILE XS STR PU (GLOVE) IMPLANT
GLOVE INDICATOR 7.0 STRL GRN (GLOVE) ×2 IMPLANT
GLOVE OPTIFIT SS 8.5 STRL (GLOVE) ×2 IMPLANT
GLOVE SURG SS PI 7.0 STRL IVOR (GLOVE) ×8 IMPLANT
GOWN BRE IMP SLV AUR LG STRL (GOWN DISPOSABLE) IMPLANT
GOWN BRE IMP SLV AUR XL STRL (GOWN DISPOSABLE) ×1 IMPLANT
GOWN STRL REIN 2XL LVL4 (GOWN DISPOSABLE) IMPLANT
GOWN STRL REUS W/ TWL XL LVL3 (GOWN DISPOSABLE) ×3 IMPLANT
GOWN STRL REUS W/TWL MED LVL3 (GOWN DISPOSABLE) ×4 IMPLANT
GOWN STRL REUS W/TWL XL LVL3 (GOWN DISPOSABLE) ×6
HEMOSTAT POWDER SURGIFOAM 1G (HEMOSTASIS) ×1 IMPLANT
KIT BASIN OR (CUSTOM PROCEDURE TRAY) ×2 IMPLANT
KIT INFUSE SMALL (Orthopedic Implant) ×1 IMPLANT
KIT ROOM TURNOVER OR (KITS) ×2 IMPLANT
MILL MEDIUM DISP (BLADE) ×2 IMPLANT
NDL ASPIRATION RAN815N 8X15 (NEEDLE) IMPLANT
NDL SPNL 22GX3.5 QUINCKE BK (NEEDLE) ×2 IMPLANT
NEEDLE ASPIRATION RAN815N 8X15 (NEEDLE) ×1 IMPLANT
NEEDLE ASPIRATION RANFAC 8X15 (NEEDLE) ×1
NEEDLE BONE MARROW 8GAX6 (NEEDLE) IMPLANT
NEEDLE SPNL 18GX3.5 QUINCKE PK (NEEDLE) IMPLANT
NEEDLE SPNL 22GX3.5 QUINCKE BK (NEEDLE) ×4 IMPLANT
NS IRRIG 1000ML POUR BTL (IV SOLUTION) ×3 IMPLANT
PACK LAMINECTOMY NEURO (CUSTOM PROCEDURE TRAY) ×2 IMPLANT
PAD ARMBOARD 7.5X6 YLW CONV (MISCELLANEOUS) ×6 IMPLANT
PATTIES SURGICAL .5 X.5 (GAUZE/BANDAGES/DRESSINGS) IMPLANT
PATTIES SURGICAL .5 X1 (DISPOSABLE) IMPLANT
PATTIES SURGICAL 1X1 (DISPOSABLE) IMPLANT
PEEK PLIF AVS 11X25X4 (Peek) ×4 IMPLANT
ROD 90MM RADIUS (Rod) ×2 IMPLANT
SCREW 5.75X50MM (Screw) ×4 IMPLANT
SPONGE GAUZE 4X4 12PLY (GAUZE/BANDAGES/DRESSINGS) ×2 IMPLANT
SPONGE LAP 4X18 X RAY DECT (DISPOSABLE) ×1 IMPLANT
SPONGE NEURO XRAY DETECT 1X3 (DISPOSABLE) IMPLANT
SPONGE SURGIFOAM ABS GEL 100 (HEMOSTASIS) ×2 IMPLANT
STRIP BIOACTIVE VITOSS 25X100X (Neuro Prosthesis/Implant) ×2 IMPLANT
STRIP BIOACTIVE VITOSS 25X52X4 (Orthopedic Implant) ×2 IMPLANT
SUT VIC AB 1 CT1 18XBRD ANBCTR (SUTURE) ×2 IMPLANT
SUT VIC AB 1 CT1 8-18 (SUTURE) ×6
SUT VIC AB 2-0 CP2 18 (SUTURE) ×4 IMPLANT
SYR 20ML ECCENTRIC (SYRINGE) ×2 IMPLANT
SYR 3ML LL SCALE MARK (SYRINGE) IMPLANT
SYR CONTROL 10ML LL (SYRINGE) ×2 IMPLANT
TOWEL OR 17X24 6PK STRL BLUE (TOWEL DISPOSABLE) ×2 IMPLANT
TOWEL OR 17X26 10 PK STRL BLUE (TOWEL DISPOSABLE) ×2 IMPLANT
TRAY FOLEY CATH 14FRSI W/METER (CATHETERS) ×1 IMPLANT
TRAY FOLEY CATH 16FRSI W/METER (SET/KITS/TRAYS/PACK) ×1 IMPLANT
WATER STERILE IRR 1000ML POUR (IV SOLUTION) ×2 IMPLANT

## 2013-12-22 NOTE — Anesthesia Postprocedure Evaluation (Signed)
  Anesthesia Post-op Note  Patient: Joshua Downs  Procedure(s) Performed: Procedure(s): LUMBAR TWO-THREE DECOMPRESSION WITH POSTERIOR LUMBAR INTERBODY FUSION POSTERIOR LATERAL ARTHRODESIS NONSEGMENTAL INSTRUMENTATION  (N/A)  Patient Location: PACU  Anesthesia Type:General  Level of Consciousness: awake, alert , sedated and patient cooperative  Airway and Oxygen Therapy: Patient Spontanous Breathing  Post-op Pain: mild  Post-op Assessment: Post-op Vital signs reviewed, Patient's Cardiovascular Status Stable, Respiratory Function Stable, Patent Airway, No signs of Nausea or vomiting and Pain level controlled  Post-op Vital Signs: stable  Complications: No apparent anesthesia complications

## 2013-12-22 NOTE — Preoperative (Signed)
Beta Blockers   Reason not to administer Beta Blockers:Not Applicable 

## 2013-12-22 NOTE — Progress Notes (Signed)
Filed Vitals:   12/22/13 1400 12/22/13 1428 12/22/13 1513 12/22/13 1633  BP:  128/58 137/89 118/67  Pulse: 44 45 51 50  Temp:  96.8 F (36 C)  97.7 F (36.5 C)  TempSrc:      Resp: 11 13 16 16   SpO2: 100% 100% 95% 96%    Patient sitting up in chair, eating dinner. Has ambulated in the halls. Dressing clean and dry. Foley to straight drainage.  Plan: Encouraged to continue to ambulate in the halls. Will DC Foley in a.m. Continue to progress through postoperative recovery.  Hosie Spangle, MD 12/22/2013, 7:05 PM

## 2013-12-22 NOTE — Anesthesia Procedure Notes (Signed)
Procedure Name: Intubation Date/Time: 12/22/2013 7:35 AM Performed by: Carney Living Pre-anesthesia Checklist: Patient identified, Emergency Drugs available, Suction available, Patient being monitored and Timeout performed Patient Re-evaluated:Patient Re-evaluated prior to inductionOxygen Delivery Method: Circle system utilized Preoxygenation: Pre-oxygenation with 100% oxygen Intubation Type: IV induction Ventilation: Mask ventilation without difficulty Laryngoscope Size: Mac and 4 Grade View: Grade I Tube type: Oral Tube size: 7.5 mm Number of attempts: 1 Airway Equipment and Method: Stylet and LTA kit utilized Placement Confirmation: ETT inserted through vocal cords under direct vision,  positive ETCO2 and breath sounds checked- equal and bilateral Secured at: 21 cm Tube secured with: Tape Dental Injury: Teeth and Oropharynx as per pre-operative assessment

## 2013-12-22 NOTE — Progress Notes (Signed)
Utilization review completed.  

## 2013-12-22 NOTE — Op Note (Signed)
12/22/2013  11:59 AM  PATIENT:  Joshua Downs  80 y.o. 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, lumbar degenerative disc disease, lumbar spondylosis, lumbar radiculopathy  POST-OPERATIVE DIAGNOSIS:  L2-3 lumbar stenosis with neurogenic claudication, lumbar degenerative disc disease, lumbar spondylosis, lumbar radiculopathy  PROCEDURE:  Procedure(s):  LUMBAR TWO-THREE DECOMPRESSION WITH POSTERIOR LUMBAR INTERBODY FUSION POSTERIOR LATERAL ARTHRODESIS SEGMENTAL INSTRUMENTATION:  Bilateral L2-3 lumbar laminotomy, facetectomy, and foraminotomy, decompression of the stenotic compression of the exiting L2 and L3 nerve roots bilaterally, with decompression beyond that required for interbody arthrodesis; bilateral L2-3 posterior lateral arthrodesis with AVS peek interbody implants, Vitoss BA with bone marrow aspirate, and infuse; bilateral L2-3 posterior lateral arthrodesis with radius segmental posterior instrumentation, Vitoss BA with bone marrow aspirate, and infuse  SURGEON:  Surgeon(s): Hosie Spangle, MD Ophelia Charter, MD  ASSISTANTS: Newman Pies, M.D.  ANESTHESIA:   general  EBL:  Total I/O In: 4132 [I.V.:3200; Blood:290; IV Piggyback:250] Out: 1300 [Urine:500; Blood:800]  BLOOD ADMINISTERED:290 CC CELLSAVER  COUNT: Correct per nursing staff  DICTATION: Patient is brought to the operating room placed under general endotracheal anesthesia. The patient was turned to prone position the lumbar region was prepped with Betadine soap and solution and draped in a sterile fashion. The midline was infiltrated with local anesthesia with epinephrine. A midline incision was made, the previous midline incision, extended rostrally. It was carried down through the subcutaneous tissue, bipolar cautery and electrocautery were used to maintain hemostasis. Dissection was carried down to the lumbar fascia. The fascia was incised bilaterally and the  paraspinal muscles were dissected with a spinous process and lamina of L2, and the superior aspect of L3 in a subperiosteal fashion. An x-ray was taken for localization and the L2-3 level was localized. Existing instrumentation from L3-L5 was carefully exposed including the screw heads, rod, and cross connector. We found a moderate seroma around the cross connector. Dissection was then carried out laterally over the L2-3 facet complex and the transverse processes of L2 and L3 were exposed and decorticated.  We began the decompression by performing a bilateral laminotomy and facetectomy using the high-speed drill and Kerrison punches.  We carefully removed the thickened ligamentum flavum, decompressing the spinal canal, lateral recess, and neuroforamen. We identified the exiting L2 and L3 nerve roots bilaterally, and carefully removed systolic overgrowth causing stenotic compression of these exiting nerve roots.  Once the decompression stenotic compression of the thecal sac and exiting nerve roots was completed we proceeded with the posterior lumbar interbody arthrodesis. The annulus was incised bilaterally and the disc space entered. A thorough discectomy was performed using pituitary rongeurs and curettes. Once the discectomy was completed we began to prepare the endplate surfaces removing the cartilaginous endplates surface with paddle curettes. We then measured the height of the intervertebral disc space. We selected 11 x 25 x 4 AVS peek interbody implants.  The C-arm fluoroscope was then draped and brought in the field and we identified the pedicle entry points bilaterally at the L2 level. We used the existing screws at L3, L4, and L5. Each of the 2 pedicles was probed, we aspirated bone marrow aspirate from the vertebral bodies, this was injected over a 10 cc and a 5 cc strip of Vitoss BA. Then each of the pedicles was examined with the ball probe good bony surfaces were found and no bony cuts were found.  Each of the pedicles was then tapped with a 5.25 mm tap, again  examined with the ball probe good threading was found and no bony cuts were found. We then placed 5.75 by 50 millimeter screws bilaterally at L2.  We then packed the AVS peek interbody implants with Vitoss BA with bone marrow aspirate and infuse, and then placed the first implant and on the right side, carefully retracting the thecal sac and nerve root medially. We then went back to the left side and packed the midline with additional Vitoss BA with bone marrow aspirate and infuse, and then placed a second implant and on the left side again retracting the thecal sac and nerve root medially. Additional Vitoss BA with bone marrow aspirate and infuse was packed lateral to the implants.  We then packed the lateral gutter over the transverse processes and intertransverse space with Vitoss BA with bone marrow aspirate and infuse.  We then unlocked the locking caps and the existing posterior instrumentation, and removed the rods and cross-link. We then selected 90 mL pre-lordosed rods, they were placed within the screw heads, from L2-L5, and secured with locking caps, once all 8 locking caps were placed final tightening was performed against a counter torque. We then placed a medium cross-link between the L4 and L5 screws. It was locked onto the rod, and then the center tightening ring was tightened down.  The wound had been irrigated multiple times during the procedure with saline solution and bacitracin solution, good hemostasis was established with a combination of bipolar cautery, surgifoam, and Gelfoam with thrombin. Once good hemostasis was confirmed we proceeded with closure paraspinal muscles deep fascia and Scarpa's fascia were closed with interrupted undyed 1 Vicryl sutures the subcutaneous and subcuticular closed with interrupted inverted 2-0 undyed Vicryl sutures the skin edges were approximated with Dermabond. The wound was dressed with  sterile gauze and Hypafix.  Following surgery the patient was turned back to the supine position to be reversed and the anesthetic extubated and transferred to the recovery room for further care.  PLAN OF CARE: Admit to inpatient   PATIENT DISPOSITION:  PACU - hemodynamically stable.   Delay start of Pharmacological VTE agent (>24hrs) due to surgical blood loss or risk of bleeding:  yes

## 2013-12-22 NOTE — H&P (Signed)
Subjective: Patient is a 80 y.o. male who is admitted for treatment of 23 lumbar stenosis secondary to hypertrophic facet arthropathy, hypertrophic ligamentum flavum, and disc bulging. The stenosis is now of a marked to severe extent. It is associated with weakness of the iliopsoas bilaterally. Patient is a little over a year status post a L3-L5 lumbar decompression and arthrodesis.  He is admitted now for a L2-3 lumbar decompression, including laminectomy, facetectomy, and foraminotomy, a bilateral L2-3 posterior lumbar interbody arthrodesis with interbody implants and bone graft, and a bilateral L2-3 posterior lateral arthrodesis with posterior instrumentation patient and bone graft.   Past Medical History  Diagnosis Date  . Kidney stones   . Cancer 2004    left kidney  . Arthritis   . Constipation     takes Dulcolax daily as needed as well as Colace  . Insomnia     takes Melatonin nightly as well as Benadryl  . Hypertension     takes Lotensin daily  . Family history of anesthesia complication     son gets sick  . Weakness     and heaviness;notices more in left but some in right  . Chronic back pain     DDD/stenosis  . Joint swelling   . History of colon polyps   . Nocturia   . Enlarged prostate   . History of kidney stones   . Macular degeneration     takes Ocuvite and Fish Oil;dry  . Bradycardia     says normal HR runs about 40-44    Past Surgical History  Procedure Laterality Date  . Hernia repair    . Parathyroidectomy    . Left kidney portion removed  2004  . Back surgery    . Back surgery  10/2012  . Spot taken off of back  2008  . Colonoscopy      Prescriptions prior to admission  Medication Sig Dispense Refill  . acetaminophen (TYLENOL) 500 MG tablet Take 1,000 mg by mouth 2 (two) times daily.       Marland Kitchen aspirin EC 81 MG tablet Take 81 mg by mouth daily.      . benazepril-hydrochlorthiazide (LOTENSIN HCT) 20-12.5 MG per tablet Take 1 tablet by mouth daily.      .  beta carotene w/minerals (OCUVITE) tablet Take 1 tablet by mouth daily.      . bisacodyl (DULCOLAX) 5 MG EC tablet Take 5 mg by mouth daily as needed for moderate constipation.      . diphenhydrAMINE (BENADRYL) 25 MG tablet Take 25 mg by mouth at bedtime.       . docusate sodium (COLACE) 100 MG capsule Take 100 mg by mouth 2 (two) times daily as needed for mild constipation.      Marland Kitchen HYDROcodone-acetaminophen (NORCO/VICODIN) 5-325 MG per tablet Take 1 tablet by mouth 2 (two) times daily as needed for moderate pain.      . Melatonin 10 MG TABS Take 10 mg by mouth at bedtime.      . Multiple Vitamins-Minerals (CENTRUM PO) Take 1 tablet by mouth daily.      . Omega-3 Fatty Acids (FISH OIL) 1200 MG CAPS Take 1 capsule by mouth 2 (two) times daily.       No Known Allergies  History  Substance Use Topics  . Smoking status: Never Smoker   . Smokeless tobacco: Current User    Types: Chew     Comment: quit smoking in 1985  . Alcohol Use: No  History reviewed. No pertinent family history.   Review of Systems A comprehensive review of systems was negative.  Objective: Vital signs in last 24 hours: Temp:  [97.2 F (36.2 C)] 97.2 F (36.2 C) (02/23 0601) Pulse Rate:  [51] 51 (02/23 0601) Resp:  [20] 20 (02/23 0601) BP: (122)/(68) 122/68 mmHg (02/23 0601) SpO2:  [98 %] 98 % (02/23 0601)  EXAM: Patient is a well-developed well-nourished white male in no acute distress. Lungs are clear to auscultation , the patient has symmetrical respiratory excursion. Heart has a regular rate and rhythm normal S1 and S2 no murmur.   Abdomen is soft nontender nondistended bowel sounds are present. Extremity examination shows no clubbing cyanosis or edema. Rod examination shows weakness of the iliopsoas 4 minus/5 bilaterally, the quadriceps are 5/5 bilaterally, the dorsiflexor and  EHL 4 minus/5 bilaterally, and the plantar flexor are 5/5 bilaterally. Sensation is intact to pinprick to the distal lower extremities.  Reflexes are absent at the quadriceps and gastrocnemius. They're symmetrical bilaterally. Toes are downgoing bilaterally. Gait and stance is mildly unsteady due to the proximal weakness.  Data Review:CBC    Component Value Date/Time   WBC 5.2 12/18/2013 1323   RBC 4.71 12/18/2013 1323   HGB 14.4 12/18/2013 1323   HCT 42.2 12/18/2013 1323   PLT 240 12/18/2013 1323   MCV 89.6 12/18/2013 1323   MCH 30.6 12/18/2013 1323   MCHC 34.1 12/18/2013 1323   RDW 13.5 12/18/2013 1323                          BMET    Component Value Date/Time   NA 143 12/18/2013 1323   K 4.5 12/18/2013 1323   CL 102 12/18/2013 1323   CO2 28 12/18/2013 1323   GLUCOSE 121* 12/18/2013 1323   BUN 28* 12/18/2013 1323   CREATININE 1.10 12/18/2013 1323   CALCIUM 9.6 12/18/2013 1323   GFRNONAA 61* 12/18/2013 1323   GFRAA 71* 12/18/2013 1323     Assessment/Plan: Patient with increasing proximal lower extremity weakness secondary to multifactorial lumbar stenosis at the L2-3 level. He is status post an L3-L5 lumbar decompression and arthrodesis in January 2014, and he returns now to be admitted for a L2-3 lumbar decompression and arthrodesis.  I've discussed with the patient the nature of his condition, the nature the surgical procedure, the typical length of surgery, hospital stay, and overall recuperation, the limitations postoperatively, and risks of surgery. I discussed risks including risks of infection, bleeding, possibly need for transfusion, the risk of nerve root dysfunction with pain, weakness, numbness, or paresthesias, the risk of dural tear and CSF leakage and possible need for further surgery, the risk of failure of the arthrodesis and possibly for further surgery, the risk of anesthetic complications including myocardial infarction, stroke, pneumonia, and death. We discussed the need for postoperative immobilization in a lumbar brace. Understanding all this the patient does wish to proceed with surgery and is admitted for  such.     Hosie Spangle, MD 12/22/2013 7:25 AM

## 2013-12-22 NOTE — Transfer of Care (Signed)
Immediate Anesthesia Transfer of Care Note  Patient: Joshua Downs  Procedure(s) Performed: Procedure(s): LUMBAR TWO-THREE DECOMPRESSION WITH POSTERIOR LUMBAR INTERBODY FUSION POSTERIOR LATERAL ARTHRODESIS NONSEGMENTAL INSTRUMENTATION  (N/A)  Patient Location: PACU  Anesthesia Type:General  Level of Consciousness: awake, alert , oriented and patient cooperative  Airway & Oxygen Therapy: Patient Spontanous Breathing and Patient connected to nasal cannula oxygen  Post-op Assessment: Report given to PACU RN, Post -op Vital signs reviewed and stable and Patient moving all extremities X 4  Post vital signs: Reviewed and stable  Complications: No apparent anesthesia complications

## 2013-12-22 NOTE — Anesthesia Preprocedure Evaluation (Addendum)
Anesthesia Evaluation  Patient identified by MRN, date of birth, ID band Patient awake    Reviewed: Allergy & Precautions, H&P , NPO status , Patient's Chart, lab work & pertinent test results  Airway Mallampati: II TM Distance: >3 FB Neck ROM: Full    Dental  (+) Edentulous Upper, Edentulous Lower   Pulmonary          Cardiovascular hypertension, Pt. on medications     Neuro/Psych    GI/Hepatic   Endo/Other    Renal/GU Renal disease     Musculoskeletal   Abdominal   Peds  Hematology   Anesthesia Other Findings   Reproductive/Obstetrics                          Anesthesia Physical Anesthesia Plan  ASA: II  Anesthesia Plan: General   Post-op Pain Management:    Induction: Intravenous  Airway Management Planned: Oral ETT  Additional Equipment:   Intra-op Plan:   Post-operative Plan: Extubation in OR  Informed Consent: I have reviewed the patients History and Physical, chart, labs and discussed the procedure including the risks, benefits and alternatives for the proposed anesthesia with the patient or authorized representative who has indicated his/her understanding and acceptance.   Dental advisory given  Plan Discussed with: CRNA, Anesthesiologist and Surgeon  Anesthesia Plan Comments:        Anesthesia Quick Evaluation

## 2013-12-23 NOTE — Progress Notes (Signed)
Filed Vitals:   12/22/13 1633 12/22/13 2000 12/22/13 2336 12/23/13 0400  BP: 118/67 100/65 98/58 128/51  Pulse: 50 59 82 86  Temp: 97.7 F (36.5 C) 97.7 F (36.5 C) 98 F (36.7 C) 98.5 F (36.9 C)  TempSrc:  Oral Oral Oral  Resp: 16 20 20 20   SpO2: 96% 93% 97% 96%    Patient up and ambulating. Foley DC'd at 0600, no void so far. Nursing staff to monitor voiding function. Dressing clean and dry.  Plan: Encouraged further ambulation. Continue to progress through postoperative recovery.  Hosie Spangle, MD 12/23/2013, 8:10 AM

## 2013-12-24 MED ORDER — OXYCODONE-ACETAMINOPHEN 5-325 MG PO TABS: 1.0000 | ORAL_TABLET | ORAL | Status: DC | PRN

## 2013-12-24 MED FILL — Sodium Chloride IV Soln 0.9%: INTRAVENOUS | Qty: 3000 | Status: AC

## 2013-12-24 MED FILL — Heparin Sodium (Porcine) Inj 1000 Unit/ML: INTRAMUSCULAR | Qty: 30 | Status: AC

## 2013-12-24 NOTE — Discharge Summary (Signed)
Physician Discharge Summary  Patient ID: Joshua Downs MRN: [id number] DOB/AGE: 06-18-1931 80 y.o.  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 Postoperatively he has done well. He is up and living actively in the halls. His incision is healing well. He is asking to be discharged to home. He has been given instructions regarding wound care and activities. He is to return for followup with me in 3 weeks.  Discharge Exam: Blood pressure 110/71, pulse 88, temperature 98.6 F (37 C), temperature source Oral, resp. rate 18, SpO2 98.00%.  Disposition: Home     Medication List         acetaminophen 500 MG tablet  Commonly known as:  TYLENOL  Take 1,000 mg by mouth 2 (two) times daily.     aspirin EC 81 MG tablet  Take 81 mg by mouth daily.     benazepril-hydrochlorthiazide 20-12.5 MG per tablet  Commonly known as:  LOTENSIN HCT  Take 1 tablet by mouth daily.     beta carotene w/minerals tablet  Take 1 tablet by mouth daily.     CENTRUM PO  Take 1 tablet by mouth daily.     bisacodyl 5 MG EC tablet  Commonly known as:  DULCOLAX  Take 5 mg by mouth daily as needed for moderate constipation.     diphenhydrAMINE 25 MG tablet  Commonly known as:  BENADRYL  Take 25 mg by mouth at bedtime.     docusate sodium 100 MG capsule  Commonly known as:  COLACE  Take 100 mg by mouth 2 (two) times daily as needed for mild constipation.     Fish Oil 1200 MG Caps  Take 1 capsule by mouth 2 (two) times  daily.     HYDROcodone-acetaminophen 5-325 MG per tablet  Commonly known as:  NORCO/VICODIN  Take 1 tablet by mouth 2 (two) times daily as needed for moderate pain.     Melatonin 10 MG Tabs  Take 10 mg by mouth at bedtime.     oxyCODONE-acetaminophen 5-325 MG per tablet  Commonly known as:  PERCOCET/ROXICET  Take 1-2 tablets by mouth every 4 (four) hours as needed for moderate pain.         Signed: Hosie Spangle, MD 12/24/2013, 8:19 AM

## 2013-12-24 NOTE — Discharge Instructions (Signed)

## 2013-12-24 NOTE — Progress Notes (Signed)
Pt doing well. Pt given D/C instructions with Rx, verbal understanding of teaching was given. Pt D/C'd home via wheelchair @ [phone] per MD order. Pt is stable @ D/C and has no other needs. Holli Humbles, RN

## 2014-03-25 ENCOUNTER — Ambulatory Visit (HOSPITAL_COMMUNITY)
Admission: RE | Admit: 2014-03-25 | Discharge: 2014-03-25 | Disposition: A | Discharge status: Home / Self Care | Payer: Medicare Other | Source: Ambulatory Visit | Attending: Neurosurgery | Admitting: Neurosurgery

## 2014-03-25 DIAGNOSIS — M25559 Pain in unspecified hip: Secondary | ICD-10-CM | POA: Insufficient documentation

## 2014-03-25 DIAGNOSIS — M6281 Muscle weakness (generalized): Secondary | ICD-10-CM | POA: Insufficient documentation

## 2014-03-25 DIAGNOSIS — R269 Unspecified abnormalities of gait and mobility: Secondary | ICD-10-CM | POA: Insufficient documentation

## 2014-03-25 DIAGNOSIS — IMO0001 Reserved for inherently not codable concepts without codable children: Secondary | ICD-10-CM | POA: Insufficient documentation

## 2014-03-25 DIAGNOSIS — M25659 Stiffness of unspecified hip, not elsewhere classified: Secondary | ICD-10-CM | POA: Insufficient documentation

## 2014-03-25 NOTE — Evaluation (Signed)
Physical Therapy Evaluation  Patient Details  Name: Joshua Downs MRN: [id number] Date of Birth: 07/01/1931  Today's Date: 03/25/2014 Time: 1015-1100 PT Time Calculation (min): 45 min      Charges: 1 Eval        Visit#: 1 of 9  Re-eval: 04/24/14 Assessment Next MD Visit: August, [name]  Authorization: Medicare    Authorization Time Period:    Authorization Visit#: 1 of 9   Past Medical History:  Past Medical History  Diagnosis Date  . Kidney stones   . Cancer 2004    left kidney  . Arthritis   . Constipation     takes Dulcolax daily as needed as well as Colace  . Insomnia     takes Melatonin nightly as well as Benadryl  . Hypertension     takes Lotensin daily  . Family history of anesthesia complication     son gets sick  . Weakness     and heaviness;notices more in left but some in right  . Chronic back pain     DDD/stenosis  . Joint swelling   . History of colon polyps   . Nocturia   . Enlarged prostate   . History of kidney stones   . Macular degeneration     takes Ocuvite and Fish Oil;dry  . Bradycardia     says normal HR runs about 40-44   Past Surgical History:  Past Surgical History  Procedure Laterality Date  . Hernia repair    . Parathyroidectomy    . Left kidney portion removed  2004  . Back surgery    . Back surgery  10/2012  . Spot taken off of back  2008  . Colonoscopy      Subjective Symptoms/Limitations Symptoms: No pain, just when walking in Rt posterior and superior hip, Pertinent History: Xray, MRI insignificant, Patient has had prior back surgery for back pain with referral to anterior thigh and Lt  LE. Patient has since had an abnormal gait, difficulty performing dorsiflexion. Most recent surgery 12/22/13 on L2 procedure unspecified. Patient is wearing bilateral AFO's due to history of Lt and Rt foot drop, Patient Rt foot no longer drops.  Limitations: Walking How long can you walk comfortably?: 37minutes Pain Assessment Currently in  Pain?: Yes Pain Score: 0-No pain Pain Location: Hip Pain Orientation: Right;Posterior Pain Type: Chronic pain  Cognition/Observation Observation/Other Assessments Observations: Gait assessment with bilateral AFOs: excessive hip external rotation, Lt trendelenberg > Rt, Rt hip hike, increased knee valgus moment.   Assessment RLE AROM (degrees) Right Hip Extension: 5 RLE Strength Right Hip Extension: 3/5 Right Hip External Rotation : 50 Right Hip Internal Rotation : 12 Right Hip ABduction: 3/5 Right Hip ADduction: 3/5 Right Knee Flexion: 4/5 Right Knee Extension: 5/5 Right Ankle Dorsiflexion: 4/5 LLE AROM (degrees) Left Hip Extension: 5 Left Hip External Rotation : 54 Left Hip Internal Rotation : 4 LLE Strength Left Hip Extension: 3/5 Left Hip ABduction: 2+/5 Left Hip ADduction: 3+/5 Left Knee Flexion: 3+/5 Left Knee Extension: 4/5 Left Ankle Dorsiflexion: 0/5  Exercise/Treatments Stretches Piriformis stretch 10x 3seconds  Physical Therapy Assessment and Plan PT Assessment and Plan Clinical Impression Statement: Patient displays posterior hip pain during gait due to abnormal gait pattern secondary to LE weakness and stiffness resulting in increased strain on his low back and pelvis. Followign piriformis stretch patient demsontrated improved gait and noted decreased pain during ambualtion. patient will benefit from skilled pjhysical therapy to improve hip mobilty and LE strength  to  improve stability through out gait and improve gait abnormalities so that patient will be able to return to ambulatign in the community without pain.  Pt will benefit from skilled therapeutic intervention in order to improve on the following deficits: Abnormal gait;Decreased balance;Decreased strength;Difficulty walking;Decreased mobility;Increased fascial restricitons;Impaired flexibility;Improper body mechanics;Pain Rehab Potential: Good PT Frequency: Min 2X/week PT Duration: 4 weeks PT Plan:  Introduce LE stretches next session and initiate LE strengthening    Goals Home Exercise Program Pt/caregiver will Perform Home Exercise Program: For increased ROM PT Goal: Perform Home Exercise Program - Progress: Goal set today PT Short Term Goals Time to Complete Short Term Goals: 4 weeks PT Short Term Goal 1: Patient's hip internal rotation to improve to 35 degrees bilaterally to improve shock absorption durign gait and improve ability to toe in during gait. PT Short Term Goal 2: Patient's glut med/max strength to imrpove to 4/5 to indicate improvign strenght and stability during walking PT Short Term Goal 3: Patient will be able to ambulate 76minutes without pain >2/10 so patient can walk community distances  Problem List Patient Active Problem List   Diagnosis Date Noted  . Abnormality of gait 03/25/2014  . Lumbar stenosis with neurogenic claudication 12/22/2013    PT - End of Session Activity Tolerance: Patient tolerated treatment well General Behavior During Therapy: WFL for tasks assessed/performed PT Plan of Care PT Home Exercise Plan: piriformis stretch  GP Functional Assessment Tool Used: FOTO Status 60% Functional Limitation: Mobility: Walking and moving around Mobility: Walking and Moving Around Current Status ([id number]): At least 40 percent but less than 60 percent impaired, limited or restricted Mobility: Walking and Moving Around Goal Status [phone]): At least 20 percent but less than 40 percent impaired, limited or restricted  [name] 03/25/2014, 8:39 PM  Physician Documentation Your signature is required to indicate approval of the treatment plan as stated above.  Please sign and either send electronically or make a copy of this report for your files and return this physician signed original.   Please mark one 1.__approve of plan  2. ___approve of plan with the following conditions.   ______________________________                                                           _____________________ Physician Signature                                                                                                             Date

## 2014-03-30 ENCOUNTER — Ambulatory Visit (HOSPITAL_COMMUNITY)
Admission: RE | Admit: 2014-03-30 | Discharge: 2014-03-30 | Disposition: A | Discharge status: Home / Self Care | Payer: Medicare Other | Source: Ambulatory Visit | Attending: Family Medicine | Admitting: Family Medicine

## 2014-03-30 DIAGNOSIS — R269 Unspecified abnormalities of gait and mobility: Secondary | ICD-10-CM | POA: Insufficient documentation

## 2014-03-30 DIAGNOSIS — M25559 Pain in unspecified hip: Secondary | ICD-10-CM | POA: Insufficient documentation

## 2014-03-30 DIAGNOSIS — IMO0001 Reserved for inherently not codable concepts without codable children: Secondary | ICD-10-CM | POA: Insufficient documentation

## 2014-03-30 DIAGNOSIS — M6281 Muscle weakness (generalized): Secondary | ICD-10-CM | POA: Insufficient documentation

## 2014-03-30 DIAGNOSIS — M25659 Stiffness of unspecified hip, not elsewhere classified: Secondary | ICD-10-CM | POA: Insufficient documentation

## 2014-03-30 NOTE — Progress Notes (Signed)
Physical Therapy Treatment Patient Details  Name: Joshua Downs MRN: [id number] Date of Birth: Jul 14, 1931  Today's Date: 03/30/2014 Time: 0928-1020 PT Time Calculation (min): 52 min Charge :[id number]   Visit#: 2 of 9  Re-eval: 04/24/14 Assessment Diagnosis: Difficulty walking Surgical Date: 12/22/13 Next MD Visit: August, Joshua Downs  Authorization: Medicare  Authorization Time Period:    Authorization Visit#: 2 of 9   Subjective: Symptoms/Limitations Symptoms: Pain free today, compliant with HEP without question. Pain Assessment Currently in Pain?: No/denies  Precautions/Restrictions  Precautions Precautions: Back Precaution Comments: bend, lifting, twisting  Exercise/Treatments Stretches Active Hamstring Stretch: 3 reps;10 seconds;Limitations Active Hamstring Stretch Limitations: 3 way direction on 14 in box Hip Flexor Stretch: 3 reps;20 seconds;Limitations Hip Flexor Stretch Limitations: on 8in box ITB Stretch: 3 reps;10 seconds;Limitations ITB Stretch Limitations: standing with foot on 8 in step Piriformis Stretch: 3 reps;10 seconds;Limitations Piriformis Stretch Limitations: 3 way direction seated  Gastroc Stretch: 3 reps;30 seconds;Limitations Gastroc Stretch Limitations: slant board Aerobic Stationary Bike: Nustep hill level 3, resistance 3, SPM >100 for strengthening and activity tolerance Standing Other Standing Knee Exercises: 3D hip excursion    Physical Therapy Assessment and Plan PT Assessment and Plan Clinical Impression Statement: Began PT POC introducing stretches for LE musculature lengthening, vc-ing required to reduce ER during standing stretches.  Reviewed back precautions which pt stated he was unsure if the BLT (no bending, lifting or twisting) precautions applies with most recent surgery in February, pt encouraged to review precautions with MD.  Began 3D hip excursion to improve hip mobilty and LE strengthening.  Ended session with Nustep, LE  only, for strengthening and to improve activity tolerance.  No reports of pain through session.   PT Plan: Continue with PT POC, initiate wtih stretches and progress LE strengthening.  Begin lunges on 8 in step and progress squats next session.      Goals Home Exercise Program Pt/caregiver will Perform Home Exercise Program: For increased ROM PT Short Term Goals Time to Complete Short Term Goals: 4 weeks PT Short Term Goal 1: Patient's hip internal rotation to improve to 35 degrees bilaterally to improve shock absorption durign gait and improve ability to toe in during gait. PT Short Term Goal 1 - Progress: Progressing toward goal PT Short Term Goal 2: Patient's glut med/max strength to imrpove to 4/5 to indicate improvign strenght and stability during walking PT Short Term Goal 2 - Progress: Progressing toward goal PT Short Term Goal 3: Patient will be able to ambulate 49minutes without pain >2/10  Problem List Patient Active Problem List   Diagnosis Date Noted  . Abnormality of gait 03/25/2014  . Lumbar stenosis with neurogenic claudication 12/22/2013    PT - End of Session Activity Tolerance: Patient tolerated treatment well General Behavior During Therapy: Joshua Downs for tasks assessed/performed  GP    Joshua Downs 03/30/2014, 10:48 AM

## 2014-04-01 ENCOUNTER — Ambulatory Visit (HOSPITAL_COMMUNITY)
Admission: RE | Admit: 2014-04-01 | Discharge: 2014-04-01 | Disposition: A | Discharge status: Home / Self Care | Payer: Medicare Other | Source: Ambulatory Visit | Attending: Family Medicine | Admitting: Family Medicine

## 2014-04-01 NOTE — Progress Notes (Signed)
Physical Therapy Treatment Patient Details  Name: Joshua Downs MRN: [id number] Date of Birth: 12-06-1930  Today's Date: 04/01/2014 Time: 1018-1108 PT Time Calculation (min): 50 min Charge: [id number]  Visit#: 3 of 9  Re-eval: 04/24/14 Assessment Diagnosis: Difficulty walking Surgical Date: 12/22/13 Next MD Visit: August 11, [name]  Authorization: Medicare  Authorization Time Period:    Authorization Visit#: 3 of 9   Subjective: Symptoms/Limitations Symptoms: Pt reported he called MD about back precautions, stated to complete with own judgement.  No pain today.  Pt with brace on Lt LE, not on Rt LE today Pain Assessment Currently in Pain?: No/denies  Objective:   Exercise/Treatments Stretches Active Hamstring Stretch: 3 reps;10 seconds;Limitations Active Hamstring Stretch Limitations: 3 way direction on 14 in box Gastroc Stretch: 3 reps;30 seconds;Limitations Gastroc Stretch Limitations: slant board Aerobic Stationary Bike: Nustep hill level 3, resistance 4 SPM >100 for strengthening and activity tolerance Standing Heel Raises: 10 reps;Limitations Forward Lunges: Both;10 reps;Limitations Forward Lunges Limitations: on 8in step  Other Standing Knee Exercises: 3D hip excursion 10x Other Standing Knee Exercises: Side stepping with green tband 1RT, Retro gait 1RT Supine Bridges: 10 reps Sidelying Hip ABduction: AAROM;Both;10 reps;2 sets Clams: 10xBil with cueing for form      Physical Therapy Assessment and Plan PT Assessment and Plan Clinical Impression Statement: Continued stretches for LE musculature lengtjhening and progressed strengthening exercises  Pt called MD and reviewed precautions, increased hip mobitliy today following clearance.  Strengthening focus on improve glut med activation with therapist faciliation to reduce compensation from TFL and rectus femoris.  Cueing required to reduce ER with standing exercises.  Reviewed HEP to assure correct form and  technique for correct mm activation with clams.  Ended session wtih Nustep, increased resistance for strengthenig and activtiy tolerance.  Pt limited by fatigue at end of session, no reports of pain.   PT Plan: Continue with PT POC, initiate wtih stretches and progress LE strengthening.  Next session begin sidestepping.        Goals Home Exercise Program Pt/caregiver will Perform Home Exercise Program: For increased ROM PT Short Term Goals Time to Complete Short Term Goals: 4 weeks PT Short Term Goal 1: Patient's hip internal rotation to improve to 35 degrees bilaterally to improve shock absorption durign gait and improve ability to toe in during gait. PT Short Term Goal 1 - Progress: Progressing toward goal PT Short Term Goal 2: Patient's glut med/max strength to imrpove to 4/5 to indicate improvign strenght and stability during walking PT Short Term Goal 2 - Progress: Progressing toward goal PT Short Term Goal 3: Patient will be able to ambulate 20minutes without pain >2/10 PT Short Term Goal 3 - Progress: Progressing toward goal  Problem List Patient Active Problem List   Diagnosis Date Noted  . Abnormality of gait 03/25/2014  . Lumbar stenosis with neurogenic claudication 12/22/2013    PT - End of Session Activity Tolerance: Patient tolerated treatment well General Behavior During Therapy: [facility] for tasks assessed/performed  GP    Aldona Lento 04/01/2014, 12:21 PM

## 2014-04-06 ENCOUNTER — Ambulatory Visit (HOSPITAL_COMMUNITY)
Admission: RE | Admit: 2014-04-06 | Discharge: 2014-04-06 | Disposition: A | Discharge status: Home / Self Care | Payer: Medicare Other | Source: Ambulatory Visit | Attending: Neurosurgery | Admitting: Neurosurgery

## 2014-04-06 NOTE — Progress Notes (Signed)
Physical Therapy Treatment Patient Details  Name: ADAMA FERBER MRN: [id number] Date of Birth: 03/04/1931  Today's Date: 04/06/2014 Time: 0928-1018 PT Time Calculation (min): 50 min Charge: TE [id number]  Visit#: 4 of 9  Re-eval: 04/24/14 Assessment Diagnosis: Difficulty walking Surgical Date: 12/22/13 Next MD Visit: August 11, [name]  Authorization: Medicare  Authorization Time Period:    Authorization Visit#: 4 of 9   Subjective: Symptoms/Limitations Symptoms: Pt reports compliance with HEP, minor back pain 1-2/10.   Pain Assessment Currently in Pain?: Yes Pain Score: 2  Pain Location: Back Pain Orientation: Lower  Precautions/Restrictions  Precautions Precautions: Back Precaution Comments: bend, lifting, twisting  Exercise/Treatments Stretches Active Hamstring Stretch: 3 reps;60 seconds;Limitations Active Hamstring Stretch Limitations: 3 way direction on 14 in box Quad Stretch: 2 reps;30 seconds;Limitations Quad Stretch Limitations: standing with HHA Gastroc Stretch: 3 reps;30 seconds;Limitations Gastroc Stretch Limitations: slant board Aerobic Stationary Bike: Nustep hill level 3, resistance 4 SPM >100 for strengthening and activity tolerance Standing Heel Raises: 15 reps;Limitations Heel Raises Limitations: Rt LE toe raises, unable Lt LE Knee Flexion: Both;10 reps;Limitations Forward Lunges: Both;10 reps;Limitations Forward Lunges Limitations: on 8in step  Side Lunges: Both;10 reps;5 seconds;Limitations Side Lunges Limitations: on 8in step Functional Squat: Limitations Functional Squat Limitations: squat position side stepping 1RT Other Standing Knee Exercises: 3D hip excursion 10x Other Standing Knee Exercises: Side stepping with green tband 1RT, Retro gait 1RT    Physical Therapy Assessment and Plan PT Assessment and Plan Clinical Impression Statement: Progressed standing exercises for LE strenghtenuing, added side lunges and sidestepping in squat  position for glut med strengthening.  Continued stretches for LE musculature lengthening with noted Rt hamstring tighter than Lt.  PT Plan: Continue with PT POC, initiate wtih stretches and progress LE strengthening.     Goals Home Exercise Program Pt/caregiver will Perform Home Exercise Program: For increased ROM PT Short Term Goals Time to Complete Short Term Goals: 4 weeks PT Short Term Goal 1: Patient's hip internal rotation to improve to 35 degrees bilaterally to improve shock absorption durign gait and improve ability to toe in during gait. PT Short Term Goal 1 - Progress: Progressing toward goal PT Short Term Goal 2: Patient's glut med/max strength to imrpove to 4/5 to indicate improvign strenght and stability during walking PT Short Term Goal 2 - Progress: Progressing toward goal PT Short Term Goal 3: Patient will be able to ambulate 33minutes without pain >2/10 PT Short Term Goal 3 - Progress: Progressing toward goal  Problem List Patient Active Problem List   Diagnosis Date Noted  . Abnormality of gait 03/25/2014  . Lumbar stenosis with neurogenic claudication 12/22/2013    PT - End of Session Activity Tolerance: Patient tolerated treatment well General Behavior During Therapy: [facility] for tasks assessed/performed  GP    Aldona Lento 04/06/2014, 10:17 AM

## 2014-04-08 ENCOUNTER — Ambulatory Visit (HOSPITAL_COMMUNITY)
Admission: RE | Admit: 2014-04-08 | Discharge: 2014-04-08 | Disposition: A | Discharge status: Home / Self Care | Payer: Medicare Other | Source: Ambulatory Visit | Attending: Family Medicine | Admitting: Family Medicine

## 2014-04-08 NOTE — Progress Notes (Signed)
Physical Therapy Treatment Patient Details  Name: BRENNIN DURFEE MRN: [id number] Date of Birth: 1931/03/02  Today's Date: 04/08/2014 Time: 3748-2707 PT Time Calculation (min): 47 min Charge: TE [id number]  Visit#: 5 of 9  Re-eval: 04/24/14 Assessment Diagnosis: Difficulty walking Surgical Date: 12/22/13 Next MD Visit: August 11, [name]  Authorization: Medicare  Authorization Time Period:    Authorization Visit#: 5 of 9   Subjective: Symptoms/Limitations Symptoms: Pt stated he was sore in quads following last session.  Pt reports increased difficulty with stairs Pain Assessment Currently in Pain?: No/denies  Precautions/Restrictions  Precautions Precautions: Back Precaution Comments: bend, lifting, twisting  Exercise/Treatments Stretches Active Hamstring Stretch: 3 reps;60 seconds;Limitations Active Hamstring Stretch Limitations: 3 way direction on 14 in box Quad Stretch: 3 reps;30 seconds;Limitations Quad Stretch Limitations: prone with rope ITB Stretch: 3 reps;20 seconds Gastroc Stretch: 3 reps;30 seconds;Limitations Gastroc Stretch Limitations: slant board with toe pointed in to increase IR Aerobic Stationary Bike: Nustep hill level 3, resistance 4 SPM >100 for strengthening and activity tolerance Standing Forward Lunges: Both;10 reps;Limitations Forward Lunges Limitations: on 8in step  Side Lunges: Both;10 reps;5 seconds;Limitations Lateral Step Up: Both;10 reps;Hand Hold: 1;Step Height: 4" Forward Step Up: Both;10 reps;Hand Hold: 1;Step Height: 6" Step Down: Both;10 reps;Hand Hold: 1;Step Height: 4"      Physical Therapy Assessment and Plan PT Assessment and Plan Clinical Impression Statement: Began stair training following reports of increased difficutly, noted weak eccentric control descending step.  Continued lunges and squats for glut strengthenung.  Added IT Band and quad stretches with increased hold time for musculature lengtheing to increase  flexibility to assist with soreness relief.   PT Plan: Continue with PT POC, initiate wtih stretches and progress LE strengthening.     Goals Home Exercise Program Pt/caregiver will Perform Home Exercise Program: For increased ROM PT Short Term Goals Time to Complete Short Term Goals: 4 weeks PT Short Term Goal 1: Patient's hip internal rotation to improve to 35 degrees bilaterally to improve shock absorption durign gait and improve ability to toe in during gait. PT Short Term Goal 1 - Progress: Progressing toward goal PT Short Term Goal 2: Patient's glut med/max strength to imrpove to 4/5 to indicate improvign strenght and stability during walking PT Short Term Goal 2 - Progress: Progressing toward goal PT Short Term Goal 3: Patient will be able to ambulate 50minutes without pain >2/10 PT Short Term Goal 3 - Progress: Progressing toward goal  Problem List Patient Active Problem List   Diagnosis Date Noted  . Abnormality of gait 03/25/2014  . Lumbar stenosis with neurogenic claudication 12/22/2013    PT - End of Session Activity Tolerance: Patient tolerated treatment well General Behavior During Therapy: [facility] for tasks assessed/performed  GP    Aldona Lento 04/08/2014, 11:00 AM

## 2014-04-13 ENCOUNTER — Ambulatory Visit (HOSPITAL_COMMUNITY)
Admission: RE | Admit: 2014-04-13 | Discharge: 2014-04-13 | Disposition: A | Discharge status: Home / Self Care | Payer: Medicare Other | Source: Ambulatory Visit | Attending: Family Medicine | Admitting: Family Medicine

## 2014-04-13 NOTE — Progress Notes (Signed)
Physical Therapy Treatment Patient Details  Name: Joshua Downs MRN: [id number] Date of Birth: [birth year]  Today's Date: 04/13/2014 Time: 0932-1027 PT Time Calculation (min): 55 min  Visit#: 6 of 9  Re-eval: 04/24/14    Authorization: Medicare  Authorization Time Period:    Authorization Visit#: 6 of 9   Subjective: Symptoms/Limitations Symptoms: no complaints this morning   Exercise/Treatments    Stretches Active Hamstring Stretch: 3 reps;60 seconds;Limitations Active Hamstring Stretch Limitations: 3 way direction on 14 in box Gastroc Stretch: 3 reps;30 seconds;Limitations Gastroc Stretch Limitations: slant board with toe pointed in to increase IR Aerobic Stationary Bike: Nustep hill level 3, resistance 4 SPM >100 for strengthening and activity tolerance    Standing Lateral Step Up: Both;10 reps;Hand Hold: 1;Step Height: 4" Forward Step Up: Both;10 reps;Hand Hold: 1;Step Height: 6" Step Down: Both;10 reps;Hand Hold: 1;Step Height: 4" Other Standing Knee Exercises: Bilateral hip hikes 2" box x20 Other Standing Knee Exercises: Side stepping with green tband 1RT, Retro gait 1RT   Sidelying Hip ADduction: 15 reps;Limitations (added to HEP)      Physical Therapy Assessment and Plan PT Assessment and Plan Clinical Impression Statement: initiated hip hikes on 2"box today for trendelenburg, and S/L adduction which we added to HEP as well. Patient feels his legs are getting stronger PT Plan: Continue with PT POC, initiate wtih stretches and progress LE strengthening.     Goals    Problem List Patient Active Problem List   Diagnosis Date Noted  . Abnormality of gait 03/25/2014  . Lumbar stenosis with neurogenic claudication 12/22/2013    General Behavior During Therapy: [facility] for tasks assessed/performed  GP    Joshua Yeatts ATKINSO 04/13/2014, 4:03 PM

## 2014-04-15 ENCOUNTER — Ambulatory Visit (HOSPITAL_COMMUNITY)
Admission: RE | Admit: 2014-04-15 | Discharge: 2014-04-15 | Disposition: A | Discharge status: Home / Self Care | Payer: Medicare Other | Source: Ambulatory Visit | Attending: Family Medicine | Admitting: Family Medicine

## 2014-04-15 NOTE — Progress Notes (Signed)
Physical Therapy Treatment Patient Details  Name: Joshua Downs MRN: [id number] Date of Birth: 01-23-1931  Today's Date: 04/15/2014 Time: 1020-1100 PT Time Calculation (min): 40 min   Charges: TherEx 1020-1050, Manual 1050-1100 Visit#: 8 of 9  Re-eval: 04/24/14 Assessment Diagnosis: Difficulty walking Surgical Date: 12/22/13 Next MD Visit: August 11, [name]  Authorization: Medicare  Authorization Visit#: 8 of 9   Subjective: Symptoms/Limitations Symptoms: Patient states continued pain in bilateral hips and low back secondary to  difficulty walking and unsteady gait. Pain Assessment Currently in Pain?: Yes Pain Score: 3  Pain Location: Back Pain Orientation: Lower Pain Type: Chronic pain Pain Radiating Towards: hips  Exercise/Treatments Stretches Active Hamstring Stretch: Limitations Active Hamstring Stretch Limitations: 3 way 10x 3seconds on 14 in box Hip Flexor Stretch Limitations: 3way on 8in box 10x 3seconds Piriformis Stretch: Limitations Piriformis Stretch Limitations: 3way on 8in box 10x 3seconds Standing Side Lunges: Both;10 reps;5 seconds;Limitations Side Lunges Limitations: 10x on floor and 10x on 8" box Functional Squat: 3 sets;10 reps;Limitations Functional Squat Limitations: neutral and split stance Other Standing Knee Exercises: 3D hip excursion 10x Supine Straight Leg Raises: Limitations Straight Leg Raises Limitations: Core work: Bilateral bent knees raised with unilateral straight leg lowering 10x each  Manual Therapy Manual Therapy: Massage Massage: Lumbar paraspinal and quadratus lumborum, piriformis 1minutes  Physical Therapy Assessment and Plan PT Assessment and Plan Clinical Impression Statement: patient displasy improving ROM but continues to have limited LE stregth resulting in limited gait mechanics. Patient's continued low back pain is attributed to his \\history  of low back surgery and continued gait disturbances secondary to bilateral hip  weakness (Rt weaker than Lt). Patient desponded well to glut and core strengthenig with improved gait followign exercises, Patient was educated in HEP to continue progressing strength at home. PT Plan: Nest session reasses to determine if patient is appropriate for discharge or shoulde continue with skilled phsycial therapy.     Goals Home Exercise Program Pt/caregiver will Perform Home Exercise Program: For increased ROM PT Goal: Perform Home Exercise Program - Progress: Progressing toward goal PT Short Term Goals PT Short Term Goal 1: Patient's hip internal rotation to improve to 35 degrees bilaterally to improve shock absorption durign gait and improve ability to toe in during gait. PT Short Term Goal 1 - Progress: Progressing toward goal PT Short Term Goal 2: Patient's glut med/max strength to imrpove to 4/5 to indicate improvign strenght and stability during walking PT Short Term Goal 2 - Progress: Progressing toward goal PT Short Term Goal 3: Patient will be able to ambulate 50minutes without pain >2/10 PT Short Term Goal 3 - Progress: Progressing toward goal  Problem List Patient Active Problem List   Diagnosis Date Noted  . Abnormality of gait 03/25/2014  . Lumbar stenosis with neurogenic claudication 12/22/2013    PT - End of Session Activity Tolerance: Patient tolerated treatment well General Behavior During Therapy: [facility] for tasks assessed/performed PT Plan of Care PT Home Exercise Plan: piriformis stretch, squats, supine straight leg raise for core strengtrhening,   GP    DeWitt, Cash R 04/15/2014, 11:32 AM

## 2014-04-20 ENCOUNTER — Telehealth (HOSPITAL_COMMUNITY): Payer: Self-pay

## 2014-04-20 ENCOUNTER — Ambulatory Visit (HOSPITAL_COMMUNITY)
Admission: RE | Admit: 2014-04-20 | Discharge: 2014-04-20 | Disposition: A | Discharge status: Home / Self Care | Payer: Medicare Other | Source: Ambulatory Visit | Attending: Family Medicine | Admitting: Family Medicine

## 2014-04-20 NOTE — Progress Notes (Signed)
Physical Therapy Re-evaluation/Progress Note/ Discharge summary  Patient Details  Name: Joshua Downs MRN: [id number] Date of Birth: 03/23/1931  Today's Date: 04/20/2014 Time: 1018-1100 PT Time Calculation (min): 42 min Charge: MMT/ROM measurement 1018-1028, TE 1030-1050, Self care 1050-1058              Visit#: 9 of 9  Re-eval: 04/24/14 Assessment Diagnosis: Difficulty walking Surgical Date: 12/22/13 Next MD Visit: August 11, [name]  Authorization: Medicare    Authorization Time Period:    Authorization Visit#: 9 of 9   Subjective Symptoms/Limitations Symptoms: Pt reported increased LBP following last session with rotation,  Pt is unsure if he has made any improvements with gait.   How long can you sit comfortably?: several hours How long can you stand comfortably?: Able to stand 1 hour comfortably How long can you walk comfortably?: Able to walk for close to an hour (was 6minutes) Pain Assessment Currently in Pain?: No/denies  Precautions/Restrictions  Precautions Precautions: Back Precaution Comments: bend, lifting, twisting  Assessment RLE AROM (degrees) Right Hip Extension: 20 (was 5) RLE Strength Right Hip Internal Rotation : 18 (was 12) Right Hip ABduction: 3+/5 (was 3/5) Right Hip ADduction: 5/5 (was 3/5) Right Knee Flexion:  (4+/5 was 4/5) Right Knee Extension: 5/5 (was 5/5) Right Ankle Dorsiflexion:  (was 4/5) LLE AROM (degrees) Left Hip Extension: 20 (was 5) Left Hip Internal Rotation : 20 (was 4) LLE Strength Left Hip Extension:  (4+/5 was 3/5) Left Hip ABduction: 4/5 (was 2+/5) Left Hip ADduction:  (4+/5 was 3+/5) Left Knee Flexion:  (4+/5 was 3+/5) Left Knee Extension: 5/5 (was 4/5) Left Ankle Dorsiflexion: 1/5 (was 0/5)  Exercise/Treatments Stretches Active Hamstring Stretch: Limitations;3 reps;30 seconds Active Hamstring Stretch Limitations: supine with rope Quad Stretch: 3 reps;30 seconds;Limitations Quad Stretch Limitations: prone with  rope Hip Flexor Stretch: 3 reps;20 seconds;Limitations Standing Forward Lunges: Both;10 reps;Limitations Side Lunges: Both;10 reps;5 seconds;Limitations Seated Other Seated Knee Exercises: Heel roll out 10x10" Supine Straight Leg Raises: Limitations;10 reps Sidelying Hip ADduction: 15 reps;Limitations Hip ADduction Limitations: cueing for positioning    Physical Therapy Assessment and Plan PT Assessment and Plan Clinical Impression Statement: Reasseement complete with the following findings:  Pt independent with HEP daily and able to demonstate appropriate technique with all pain scale.  Pain average from 0-1/10.  Improved LE strength to WNL except for glut medius musculature.  Reviewed proper form and technique with HEP exercises to assure correct mm used with min cueing for form.  Improved IR Bil LE.  Pt given advance HEP worksheet to progress at home.  Pt stated he could not tell alot of improvements with gait but is able to report improved tolerance for sitting, standing and walking without increased pain.   PT Plan: D/C to HEP.      Goals Home Exercise Program Pt/caregiver will Perform Home Exercise Program: For increased ROM PT Goal: Perform Home Exercise Program - Progress: Met (2x daily) PT Short Term Goals PT Short Term Goal 1: Patient's hip internal rotation to improve to 35 degrees bilaterally to improve shock absorption durign gait and improve ability to toe in during gait. PT Short Term Goal 1 - Progress: Progressing toward goal PT Short Term Goal 2: Patient's glut med/max strength to imrpove to 4/5 to indicate improvign strenght and stability during walking PT Short Term Goal 2 - Progress: Progressing toward goal PT Short Term Goal 3: Patient will be able to ambulate 36minutes without pain >2/10 PT Short Term Goal 3 - Progress: Met  Problem List Patient Active Problem List   Diagnosis Date Noted  . Abnormality of gait 03/25/2014  . Lumbar stenosis with neurogenic  claudication 12/22/2013    PT - End of Session Activity Tolerance: Patient tolerated treatment well General Behavior During Therapy: [facility] for tasks assessed/performed PT Plan of Care PT Home Exercise Plan: Abduction, SLR, squats, lunges, heel roll outs and hamstring/quadricep stretches.    GP Functional Assessment Tool Used: FOTO Status 79%, was 60% Functional Limitation: Mobility: Walking and moving around Mobility: Walking and Moving Around Goal Status [phone]): At least 20 percent but less than 40 percent impaired, limited or restricted Mobility: Walking and Moving Around Discharge Status [phone]): At least 20 percent but less than 40 percent impaired, limited or restricted  Aldona Lento 04/20/2014, 11:37 AM  Physician Documentation Your signature is required to indicate approval of the treatment plan as stated above.  Please sign and either send electronically or make a copy of this report for your files and return this physician signed original.   Please mark one 1.__approve of plan  2. ___approve of plan with the following conditions.   ______________________________                                                          _____________________ Physician Signature                                                                                                             Date

## 2014-04-21 NOTE — Progress Notes (Signed)
Physical Therapy Re-evaluation/Progress Note/ Discharge summary  Patient Details  Name: Joshua Downs MRN: [id number] Date of Birth: 20-May-1931  Today's Date: 04/20/2014 Time: 1018-1100 PT Time Calculation (min): 42 min Charge: MMT/ROM measurement 1018-1028, TE 1030-1050, Self care 1050-1058              Visit#: 9 of 9  Re-eval: 04/24/14 Assessment Diagnosis: Difficulty walking Surgical Date: 12/22/13 Next MD Visit: August 11, [name]  Authorization: Medicare    Authorization Time Period:    Authorization Visit#: 9 of 9   Subjective Symptoms/Limitations Symptoms: Pt reported increased LBP following last session with rotation,  Pt is unsure if he has made any improvements with gait.   How long can you sit comfortably?: several hours How long can you stand comfortably?: Able to stand 1 hour comfortably How long can you walk comfortably?: Able to walk for close to an hour (was 54minutes) Pain Assessment Currently in Pain?: No/denies  Precautions/Restrictions  Precautions Precautions: Back Precaution Comments: bend, lifting, twisting  Assessment RLE AROM (degrees) Right Hip Extension: 20 (was 5) RLE Strength Right Hip Internal Rotation : 18 (was 12) Right Hip ABduction: 3+/5 (was 3/5) Right Hip ADduction: 5/5 (was 3/5) Right Knee Flexion:  (4+/5 was 4/5) Right Knee Extension: 5/5 (was 5/5) Right Ankle Dorsiflexion:  (was 4/5) LLE AROM (degrees) Left Hip Extension: 20 (was 5) Left Hip Internal Rotation : 20 (was 4) LLE Strength Left Hip Extension:  (4+/5 was 3/5) Left Hip ABduction: 4/5 (was 2+/5) Left Hip ADduction:  (4+/5 was 3+/5) Left Knee Flexion:  (4+/5 was 3+/5) Left Knee Extension: 5/5 (was 4/5) Left Ankle Dorsiflexion: 1/5 (was 0/5)  Exercise/Treatments Stretches Active Hamstring Stretch: Limitations;3 reps;30 seconds Active Hamstring Stretch Limitations: supine with rope Quad Stretch: 3 reps;30 seconds;Limitations Quad Stretch Limitations: prone with  rope Hip Flexor Stretch: 3 reps;20 seconds;Limitations Standing Forward Lunges: Both;10 reps;Limitations Side Lunges: Both;10 reps;5 seconds;Limitations Seated Other Seated Knee Exercises: Heel roll out 10x10" Supine Straight Leg Raises: Limitations;10 reps Sidelying Hip ADduction: 15 reps;Limitations Hip ADduction Limitations: cueing for positioning    Physical Therapy Assessment and Plan PT Assessment and Plan Clinical Impression Statement: Reasseement complete with the following findings:  Pt independent with HEP daily and able to demonstate appropriate technique with all pain scale.  Pain average from 0-1/10.  Improved LE strength to WNL except for glut medius musculature.  Reviewed proper form and technique with HEP exercises to assure correct mm used with min cueing for form.  Improved IR Bil LE.  Pt given advance HEP worksheet to progress at home.  Pt stated he could not tell alot of improvements with gait but is able to report improved tolerance for sitting, standing and walking without increased pain.   PT Plan: D/C to HEP.      Goals Home Exercise Program Pt/caregiver will Perform Home Exercise Program: For increased ROM PT Goal: Perform Home Exercise Program - Progress: Met (2x daily) PT Short Term Goals PT Short Term Goal 1: Patient's hip internal rotation to improve to 35 degrees bilaterally to improve shock absorption durign gait and improve ability to toe in during gait. PT Short Term Goal 1 - Progress: Progressing toward goal PT Short Term Goal 2: Patient's glut med/max strength to imrpove to 4/5 to indicate improvign strenght and stability during walking PT Short Term Goal 2 - Progress: Progressing toward goal PT Short Term Goal 3: Patient will be able to ambulate 15minutes without pain >2/10 PT Short Term Goal 3 - Progress: Met  Problem List Patient Active Problem List   Diagnosis Date Noted  . Abnormality of gait 03/25/2014  . Lumbar stenosis with neurogenic  claudication 12/22/2013    PT - End of Session Activity Tolerance: Patient tolerated treatment well General Behavior During Therapy: [facility] for tasks assessed/performed PT Plan of Care PT Home Exercise Plan: Abduction, SLR, squats, lunges, heel roll outs and hamstring/quadricep stretches.    GP Functional Assessment Tool Used: FOTO Status 79%, was 60% Functional Limitation: Mobility: Walking and moving around Mobility: Walking and Moving Around Goal Status [phone]): At least 20 percent but less than 40 percent impaired, limited or restricted Mobility: Walking and Moving Around Discharge Status [phone]): At least 20 percent but less than 40 percent impaired, limited or restricted  Aldona Lento 04/20/2014, 11:37 AM  Devona Konig PT DPT  Physician Documentation Your signature is required to indicate approval of the treatment plan as stated above.  Please sign and either send electronically or make a copy of this report for your files and return this physician signed original.   Please mark one 1.__approve of plan  2. ___approve of plan with the following conditions.   ______________________________                                                          _____________________ Physician Signature                                                                                                             Date

## 2014-04-22 ENCOUNTER — Inpatient Hospital Stay (HOSPITAL_COMMUNITY): Admission: RE | Admit: 2014-04-22 | Payer: Medicare Other | Source: Ambulatory Visit | Admitting: Physical Therapy

## 2015-02-04 ENCOUNTER — Other Ambulatory Visit: Payer: Self-pay | Admitting: Orthopedic Surgery

## 2015-02-15 ENCOUNTER — Encounter (HOSPITAL_BASED_OUTPATIENT_CLINIC_OR_DEPARTMENT_OTHER): Payer: Self-pay | Admitting: *Deleted

## 2015-02-15 NOTE — Progress Notes (Signed)
Will go to [facility] for ekg-bmet-uses a cane due to weakness legs from lumbar surgery No cardiac-hx SB No resp problems

## 2015-02-16 ENCOUNTER — Encounter (HOSPITAL_COMMUNITY)
Admission: RE | Admit: 2015-02-16 | Discharge: 2015-02-16 | Disposition: A | Discharge status: Home / Self Care | Payer: Medicare Other | Source: Ambulatory Visit | Attending: Orthopedic Surgery | Admitting: Orthopedic Surgery

## 2015-02-16 ENCOUNTER — Other Ambulatory Visit: Payer: Self-pay

## 2015-02-16 DIAGNOSIS — M549 Dorsalgia, unspecified: Secondary | ICD-10-CM | POA: Diagnosis not present

## 2015-02-16 DIAGNOSIS — H3530 Unspecified macular degeneration: Secondary | ICD-10-CM | POA: Diagnosis not present

## 2015-02-16 DIAGNOSIS — G8929 Other chronic pain: Secondary | ICD-10-CM | POA: Diagnosis not present

## 2015-02-16 DIAGNOSIS — G5601 Carpal tunnel syndrome, right upper limb: Secondary | ICD-10-CM | POA: Diagnosis not present

## 2015-02-16 DIAGNOSIS — Z79899 Other long term (current) drug therapy: Secondary | ICD-10-CM | POA: Diagnosis not present

## 2015-02-16 DIAGNOSIS — G5602 Carpal tunnel syndrome, left upper limb: Secondary | ICD-10-CM | POA: Diagnosis not present

## 2015-02-16 DIAGNOSIS — M79644 Pain in right finger(s): Secondary | ICD-10-CM | POA: Diagnosis present

## 2015-02-16 DIAGNOSIS — N400 Enlarged prostate without lower urinary tract symptoms: Secondary | ICD-10-CM | POA: Diagnosis not present

## 2015-02-16 DIAGNOSIS — Z85528 Personal history of other malignant neoplasm of kidney: Secondary | ICD-10-CM | POA: Diagnosis not present

## 2015-02-16 DIAGNOSIS — Z7982 Long term (current) use of aspirin: Secondary | ICD-10-CM | POA: Diagnosis not present

## 2015-02-16 DIAGNOSIS — G4700 Insomnia, unspecified: Secondary | ICD-10-CM | POA: Diagnosis not present

## 2015-02-16 DIAGNOSIS — I10 Essential (primary) hypertension: Secondary | ICD-10-CM | POA: Diagnosis not present

## 2015-02-16 DIAGNOSIS — F17220 Nicotine dependence, chewing tobacco, uncomplicated: Secondary | ICD-10-CM | POA: Diagnosis not present

## 2015-02-16 DIAGNOSIS — G5622 Lesion of ulnar nerve, left upper limb: Secondary | ICD-10-CM | POA: Diagnosis not present

## 2015-02-16 DIAGNOSIS — G5621 Lesion of ulnar nerve, right upper limb: Secondary | ICD-10-CM | POA: Diagnosis not present

## 2015-02-16 DIAGNOSIS — M159 Polyosteoarthritis, unspecified: Secondary | ICD-10-CM | POA: Diagnosis not present

## 2015-02-16 LAB — BASIC METABOLIC PANEL
ANION GAP: 7 (ref 5–15)
BUN: 24 mg/dL — ABNORMAL HIGH (ref 6–23)
CHLORIDE: 102 mmol/L (ref 96–112)
CO2: 32 mmol/L (ref 19–32)
Calcium: 9.2 mg/dL (ref 8.4–10.5)
Creatinine, Ser: 1.07 mg/dL (ref 0.50–1.35)
GFR calc Af Amer: 73 mL/min — ABNORMAL LOW (ref >90)
GFR calc non Af Amer: 63 mL/min — ABNORMAL LOW (ref >90)
GLUCOSE: 102 mg/dL — AB (ref 70–99)
Potassium: 4.7 mmol/L (ref 3.5–5.1)
SODIUM: 141 mmol/L (ref 135–145)

## 2015-02-18 ENCOUNTER — Ambulatory Visit (HOSPITAL_BASED_OUTPATIENT_CLINIC_OR_DEPARTMENT_OTHER): Payer: Medicare Other | Admitting: Certified Registered"

## 2015-02-18 ENCOUNTER — Encounter (HOSPITAL_BASED_OUTPATIENT_CLINIC_OR_DEPARTMENT_OTHER): Payer: Self-pay | Admitting: Orthopedic Surgery

## 2015-02-18 ENCOUNTER — Encounter (HOSPITAL_BASED_OUTPATIENT_CLINIC_OR_DEPARTMENT_OTHER)
Admission: RE | Disposition: A | Discharge status: Home / Self Care | Payer: Self-pay | Source: Ambulatory Visit | Attending: Orthopedic Surgery

## 2015-02-18 ENCOUNTER — Ambulatory Visit (HOSPITAL_BASED_OUTPATIENT_CLINIC_OR_DEPARTMENT_OTHER)
Admission: RE | Admit: 2015-02-18 | Discharge: 2015-02-18 | Disposition: A | Discharge status: Home / Self Care | Payer: Medicare Other | Source: Ambulatory Visit | Attending: Orthopedic Surgery | Admitting: Orthopedic Surgery

## 2015-02-18 DIAGNOSIS — G5622 Lesion of ulnar nerve, left upper limb: Secondary | ICD-10-CM | POA: Insufficient documentation

## 2015-02-18 DIAGNOSIS — F17220 Nicotine dependence, chewing tobacco, uncomplicated: Secondary | ICD-10-CM | POA: Insufficient documentation

## 2015-02-18 DIAGNOSIS — M159 Polyosteoarthritis, unspecified: Secondary | ICD-10-CM | POA: Insufficient documentation

## 2015-02-18 DIAGNOSIS — H3530 Unspecified macular degeneration: Secondary | ICD-10-CM | POA: Insufficient documentation

## 2015-02-18 DIAGNOSIS — Z7982 Long term (current) use of aspirin: Secondary | ICD-10-CM | POA: Insufficient documentation

## 2015-02-18 DIAGNOSIS — M549 Dorsalgia, unspecified: Secondary | ICD-10-CM | POA: Insufficient documentation

## 2015-02-18 DIAGNOSIS — G5601 Carpal tunnel syndrome, right upper limb: Secondary | ICD-10-CM | POA: Insufficient documentation

## 2015-02-18 DIAGNOSIS — G5621 Lesion of ulnar nerve, right upper limb: Secondary | ICD-10-CM | POA: Diagnosis not present

## 2015-02-18 DIAGNOSIS — Z85528 Personal history of other malignant neoplasm of kidney: Secondary | ICD-10-CM | POA: Insufficient documentation

## 2015-02-18 DIAGNOSIS — N400 Enlarged prostate without lower urinary tract symptoms: Secondary | ICD-10-CM | POA: Insufficient documentation

## 2015-02-18 DIAGNOSIS — I10 Essential (primary) hypertension: Secondary | ICD-10-CM | POA: Insufficient documentation

## 2015-02-18 DIAGNOSIS — Z79899 Other long term (current) drug therapy: Secondary | ICD-10-CM | POA: Insufficient documentation

## 2015-02-18 DIAGNOSIS — G4700 Insomnia, unspecified: Secondary | ICD-10-CM | POA: Insufficient documentation

## 2015-02-18 DIAGNOSIS — G8929 Other chronic pain: Secondary | ICD-10-CM | POA: Insufficient documentation

## 2015-02-18 DIAGNOSIS — G5602 Carpal tunnel syndrome, left upper limb: Secondary | ICD-10-CM | POA: Diagnosis not present

## 2015-02-18 HISTORY — DX: Cardiac arrhythmia, unspecified: I49.9

## 2015-02-18 HISTORY — PX: CARPAL TUNNEL RELEASE: SHX101

## 2015-02-18 HISTORY — PX: ULNAR NERVE TRANSPOSITION: SHX2595

## 2015-02-18 SURGERY — CARPAL TUNNEL RELEASE
Anesthesia: General | Site: Wrist | Laterality: Right

## 2015-02-18 MED ORDER — HYDROCODONE-ACETAMINOPHEN 5-325 MG PO TABS: 1.0000 | ORAL_TABLET | Freq: Four times a day (QID) | ORAL | Status: DC | PRN

## 2015-02-18 MED ORDER — FENTANYL CITRATE (PF) 100 MCG/2ML IJ SOLN: 100.0000 ug | Freq: Once | INTRAMUSCULAR | Status: DC

## 2015-02-18 MED ORDER — ONDANSETRON HCL 4 MG/2ML IJ SOLN
INTRAMUSCULAR | Status: DC | PRN
  Administered 2015-02-18: 4 mg via INTRAVENOUS

## 2015-02-18 MED ORDER — MIDAZOLAM HCL 2 MG/2ML IJ SOLN
INTRAMUSCULAR | Status: AC
  Filled 2015-02-18: qty 2

## 2015-02-18 MED ORDER — CEFAZOLIN SODIUM-DEXTROSE 2-3 GM-% IV SOLR: 2.0000 g | INTRAVENOUS | Status: DC

## 2015-02-18 MED ORDER — GLYCOPYRROLATE 0.2 MG/ML IJ SOLN
INTRAMUSCULAR | Status: DC | PRN
  Administered 2015-02-18: 0.2 mg via INTRAVENOUS

## 2015-02-18 MED ORDER — FENTANYL CITRATE (PF) 100 MCG/2ML IJ SOLN
INTRAMUSCULAR | Status: AC
  Filled 2015-02-18: qty 4

## 2015-02-18 MED ORDER — CHLORHEXIDINE GLUCONATE 4 % EX LIQD: 60.0000 mL | Freq: Once | CUTANEOUS | Status: DC

## 2015-02-18 MED ORDER — CEFAZOLIN SODIUM-DEXTROSE 2-3 GM-% IV SOLR
INTRAVENOUS | Status: AC
  Filled 2015-02-18: qty 50

## 2015-02-18 MED ORDER — DEXAMETHASONE SODIUM PHOSPHATE 4 MG/ML IJ SOLN
INTRAMUSCULAR | Status: DC | PRN
  Administered 2015-02-18: 10 mg via INTRAVENOUS

## 2015-02-18 MED ORDER — LACTATED RINGERS IV SOLN
INTRAVENOUS | Status: DC
  Administered 2015-02-18: 10:00:00 via INTRAVENOUS

## 2015-02-18 MED ORDER — OXYCODONE HCL 5 MG PO TABS: 5.0000 mg | ORAL_TABLET | Freq: Once | ORAL | Status: DC | PRN

## 2015-02-18 MED ORDER — PROMETHAZINE HCL 25 MG/ML IJ SOLN: 6.2500 mg | INTRAMUSCULAR | Status: DC | PRN

## 2015-02-18 MED ORDER — ROPIVACAINE HCL 5 MG/ML IJ SOLN
INTRAMUSCULAR | Status: DC | PRN
  Administered 2015-02-18: 150 mg via PERINEURAL

## 2015-02-18 MED ORDER — PROPOFOL 10 MG/ML IV BOLUS
INTRAVENOUS | Status: DC | PRN
  Administered 2015-02-18: 150 mg via INTRAVENOUS

## 2015-02-18 MED ORDER — FENTANYL CITRATE (PF) 100 MCG/2ML IJ SOLN
100.0000 ug | INTRAMUSCULAR | Status: DC | PRN
  Administered 2015-02-18: 100 ug via INTRAVENOUS

## 2015-02-18 MED ORDER — LACTATED RINGERS IV SOLN
INTRAVENOUS | Status: DC | PRN
  Administered 2015-02-18 (×2): via INTRAVENOUS

## 2015-02-18 MED ORDER — HYDROMORPHONE HCL 1 MG/ML IJ SOLN: 0.2500 mg | INTRAMUSCULAR | Status: DC | PRN

## 2015-02-18 MED ORDER — FENTANYL CITRATE (PF) 100 MCG/2ML IJ SOLN
INTRAMUSCULAR | Status: AC
  Filled 2015-02-18: qty 2

## 2015-02-18 MED ORDER — MIDAZOLAM HCL 2 MG/2ML IJ SOLN
1.0000 mg | INTRAMUSCULAR | Status: DC | PRN
  Administered 2015-02-18: 1 mg via INTRAVENOUS

## 2015-02-18 MED ORDER — OXYCODONE HCL 5 MG/5ML PO SOLN: 5.0000 mg | Freq: Once | ORAL | Status: DC | PRN

## 2015-02-18 MED ORDER — CEFAZOLIN SODIUM-DEXTROSE 2-3 GM-% IV SOLR
INTRAVENOUS | Status: DC | PRN
  Administered 2015-02-18: 2 g via INTRAVENOUS

## 2015-02-18 SURGICAL SUPPLY — 53 items
BLADE MINI RND TIP GREEN BEAV (BLADE) IMPLANT
BLADE SURG 15 STRL LF DISP TIS (BLADE) ×2 IMPLANT
BLADE SURG 15 STRL SS (BLADE) ×4
BNDG CMPR 9X4 STRL LF SNTH (GAUZE/BANDAGES/DRESSINGS) ×2
BNDG COHESIVE 3X5 TAN STRL LF (GAUZE/BANDAGES/DRESSINGS) ×4 IMPLANT
BNDG ESMARK 4X9 LF (GAUZE/BANDAGES/DRESSINGS) ×4 IMPLANT
BNDG GAUZE ELAST 4 BULKY (GAUZE/BANDAGES/DRESSINGS) ×4 IMPLANT
CHLORAPREP W/TINT 26ML (MISCELLANEOUS) ×4 IMPLANT
CORDS BIPOLAR (ELECTRODE) ×4 IMPLANT
COVER BACK TABLE 60X90IN (DRAPES) ×4 IMPLANT
COVER MAYO STAND STRL (DRAPES) ×4 IMPLANT
CUFF TOURN SGL LL 18 NRW (TOURNIQUET CUFF) ×8 IMPLANT
CUFF TOURNIQUET SINGLE 18IN (TOURNIQUET CUFF) ×4 IMPLANT
DECANTER SPIKE VIAL GLASS SM (MISCELLANEOUS) IMPLANT
DRAPE EXTREMITY T 121X128X90 (DRAPE) ×4 IMPLANT
DRAPE SURG 17X23 STRL (DRAPES) ×4 IMPLANT
DRSG KUZMA FLUFF (GAUZE/BANDAGES/DRESSINGS) ×4 IMPLANT
DRSG PAD ABDOMINAL 8X10 ST (GAUZE/BANDAGES/DRESSINGS) ×4 IMPLANT
GAUZE SPONGE 4X4 12PLY STRL (GAUZE/BANDAGES/DRESSINGS) ×4 IMPLANT
GAUZE SPONGE 4X4 16PLY XRAY LF (GAUZE/BANDAGES/DRESSINGS) IMPLANT
GAUZE XEROFORM 1X8 LF (GAUZE/BANDAGES/DRESSINGS) ×4 IMPLANT
GLOVE BIO SURGEON STRL SZ 6.5 (GLOVE) ×2 IMPLANT
GLOVE BIO SURGEONS STRL SZ 6.5 (GLOVE) ×2
GLOVE BIOGEL PI IND STRL 7.0 (GLOVE) ×3 IMPLANT
GLOVE BIOGEL PI IND STRL 8.5 (GLOVE) ×2 IMPLANT
GLOVE BIOGEL PI INDICATOR 7.0 (GLOVE) ×6
GLOVE BIOGEL PI INDICATOR 8.5 (GLOVE) ×2
GLOVE SURG ORTHO 8.0 STRL STRW (GLOVE) ×4 IMPLANT
GOWN STRL REUS W/ TWL LRG LVL3 (GOWN DISPOSABLE) ×3 IMPLANT
GOWN STRL REUS W/TWL LRG LVL3 (GOWN DISPOSABLE) ×8
GOWN STRL REUS W/TWL XL LVL3 (GOWN DISPOSABLE) ×8 IMPLANT
LOOP VESSEL MAXI BLUE (MISCELLANEOUS) IMPLANT
NEEDLE PRECISIONGLIDE 27X1.5 (NEEDLE) ×4 IMPLANT
NS IRRIG 1000ML POUR BTL (IV SOLUTION) ×4 IMPLANT
PACK BASIN DAY SURGERY FS (CUSTOM PROCEDURE TRAY) ×4 IMPLANT
PAD CAST 3X4 CTTN HI CHSV (CAST SUPPLIES) ×2 IMPLANT
PAD CAST 4YDX4 CTTN HI CHSV (CAST SUPPLIES) ×2 IMPLANT
PADDING CAST ABS 4INX4YD NS (CAST SUPPLIES) ×2
PADDING CAST ABS COTTON 4X4 ST (CAST SUPPLIES) ×2 IMPLANT
PADDING CAST COTTON 3X4 STRL (CAST SUPPLIES) ×4
PADDING CAST COTTON 4X4 STRL (CAST SUPPLIES) ×4
SLEEVE SCD COMPRESS KNEE MED (MISCELLANEOUS) ×4 IMPLANT
SPLINT PLASTER CAST XFAST 3X15 (CAST SUPPLIES) IMPLANT
SPLINT PLASTER XTRA FASTSET 3X (CAST SUPPLIES)
STOCKINETTE 4X48 STRL (DRAPES) ×4 IMPLANT
SUT ETHILON 4 0 PS 2 18 (SUTURE) ×4 IMPLANT
SUT VIC AB 2-0 SH 27 (SUTURE) ×4
SUT VIC AB 2-0 SH 27XBRD (SUTURE) ×2 IMPLANT
SUT VICRYL 4-0 PS2 18IN ABS (SUTURE) ×4 IMPLANT
SYR BULB 3OZ (MISCELLANEOUS) ×4 IMPLANT
SYR CONTROL 10ML LL (SYRINGE) ×4 IMPLANT
TOWEL OR 17X24 6PK STRL BLUE (TOWEL DISPOSABLE) ×4 IMPLANT
UNDERPAD 30X30 INCONTINENT (UNDERPADS AND DIAPERS) ×4 IMPLANT

## 2015-02-18 NOTE — Discharge Instructions (Addendum)

## 2015-02-18 NOTE — Anesthesia Procedure Notes (Addendum)
Anesthesia Regional Block:  Supraclavicular block  Pre-Anesthetic Checklist: ,, timeout performed, Correct Patient, Correct Site, Correct Laterality, Correct Procedure, Correct Position, site marked, Risks and benefits discussed,  Surgical consent,  Pre-op evaluation,  At surgeon's request and post-op pain management  Laterality: Right  Prep: chloraprep       Needles:  Injection technique: Single-shot  Needle Type: Echogenic Stimulator Needle     Needle Length: 5cm 5 cm Needle Gauge: 22 and 22 G    Additional Needles:  Procedures: ultrasound guided (picture in chart) and nerve stimulator Supraclavicular block  Nerve Stimulator or Paresthesia:  Response: bicep contraction, 0.45 mA,   Additional Responses:   Narrative:  Start time: 02/18/2015 9:43 AM End time: 02/18/2015 9:53 AM Injection made incrementally with aspirations every 5 mL.  Performed by: Personally  Anesthesiologist: Duane Boston  Additional Notes: Functioning IV was confirmed and monitors applied.  A 22mm 22ga echogenic arrow stimulator was used. Sterile prep and drape,hand hygiene and sterile gloves were used.Ultrasound guidance: relevant anatomy identified, needle position confirmed, local anesthetic spread visualized around nerve(s)., vascular puncture avoided.  Image printed for medical record.  Negative aspiration and negative test dose prior to incremental administration of local anesthetic. The patient tolerated the procedure well.   Procedure Name: LMA Insertion Date/Time: 02/18/2015 10:10 AM Performed by: Lyndee Leo Pre-anesthesia Checklist: Patient identified, Emergency Drugs available, Suction available and Patient being monitored Patient Re-evaluated:Patient Re-evaluated prior to inductionOxygen Delivery Method: Circle System Utilized Preoxygenation: Pre-oxygenation with 100% oxygen Intubation Type: IV induction Ventilation: Mask ventilation without difficulty LMA: LMA inserted LMA Size:  5.0 Number of attempts: 1 Airway Equipment and Method: Bite block Placement Confirmation: positive ETCO2 Tube secured with: Tape Dental Injury: Teeth and Oropharynx as per pre-operative assessment

## 2015-02-18 NOTE — Brief Op Note (Signed)
02/18/2015  11:02 AM  PATIENT:  Joshua Downs  81 y.o. 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  PROCEDURE:  Procedure(s): RIGHT WRIST CARPAL TUNNEL RELEASE (Right) DECOMPRESSION ULNAR NERVE RIGHT ELBOW (Right)  RIGHT GUYON'S CANAL RELEASE (Right)  SURGEON:  Surgeon(s) and Role:    * Daryll Brod, MD - Primary  PHYSICIAN ASSISTANT:   ASSISTANTS: R Dasnoit,PAC   ANESTHESIA:   regional and general  EBL:  Total I/O In: 1000 [I.V.:1000] Out: -   BLOOD ADMINISTERED:none  DRAINS: none   LOCAL MEDICATIONS USED:  NONE  SPECIMEN:  No Specimen  DISPOSITION OF SPECIMEN:  N/A  COUNTS:  YES  TOURNIQUET:   Total Tourniquet Time Documented: Upper Arm (Right) - 32 minutes Total: Upper Arm (Right) - 32 minutes   DICTATION: .Other Dictation: Dictation Number [id number]  PLAN OF CARE: Discharge to home after PACU  PATIENT DISPOSITION:  PACU - hemodynamically stable.

## 2015-02-18 NOTE — Anesthesia Postprocedure Evaluation (Signed)
Anesthesia Post Note  Patient: Joshua Downs  Procedure(s) Performed: Procedure(s) (LRB): RIGHT WRIST CARPAL TUNNEL RELEASE (Right) DECOMPRESSION ULNAR NERVE RIGHT ELBOW (Right)  RIGHT GUYON'S CANAL RELEASE (Right)  Anesthesia type: general  Patient location: PACU  Post pain: Pain level controlled  Post assessment: Patient's Cardiovascular Status Stable  Last Vitals:  Filed Vitals:   02/18/15 1151  BP:   Pulse: 65  Temp:   Resp: 19    Post vital signs: Reviewed and stable  Level of consciousness: sedated  Complications: No apparent anesthesia complications

## 2015-02-18 NOTE — Op Note (Signed)
Dictation Number [id number]

## 2015-02-18 NOTE — Op Note (Deleted)
NAMEWESSON, STITH NO.:  [id number]  MEDICAL RECORD NO.:  [id number]  LOCATION:  DOIB                          FACILITY:  [facility]  PHYSICIAN:  Daryll Brod, M.D.       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  OPERATION:  Decompression of median nerve at the wrist, ulnar nerve at the wrist, and ulnar nerve at the elbow with simple decompression, no transposition.  SURGEON:  Daryll Brod, M.D.  ASSISTANT:  Marily Lente. Dasnoit, PA-C.  ANESTHESIA:  Supraclavicular block general.  ANESTHESIOLOGIST:  Tobias Alexander.  HISTORY:  The patient is an 81 year old male with a history of numbness and tingling in his right arm, nerve conductions are positive for changes of the ulnar nerve at both the wrist and elbow and carpal tunnel syndrome.  This has not responded to conservative treatment.  He has elected to undergo surgical decompression to the nerve at the elbow with possible transposition with decompression of the ulnar nerve at carpal and median nerve at the wrist.  He is aware of risks and complications.  He is aware that there is no guarantee with the surgery, possibility of infection, recurrence of injury to arteries, nerves, tendons, incomplete release of symptoms and dystrophy, the possibility that we are hoping the process that it may not improve but hopefully not get worse.  In the preoperative area, the patient was seen, the extremity marked by both patient and surgeon.  Antibiotic given.  PROCEDURE IN DETAIL:  The patient was brought to the operating room, where a supraclavicular block will be given in the preoperative area by Dr. Tobias Alexander.   General anesthetic was given in the operating room.  He was prepped using ChloraPrep in supine position with the right arm free.  A 3-minute dry time was allowed.  Time-out taken, confirming the patient and procedure.  The limb was exsanguinated with an Esmarch bandage. Tourniquet placed high on the arm was inflated to 250 mmHg.  A longitudinal incision was made in the right palm, carried down through subcutaneous tissue,  bleeders were electrocauterized with bipolar. Palmar fascia was split.  Superficial palmar arch identified.  Flexor tendon of the ring and little finger identified to the ulnar side of the median nerve.  Carpal retinaculum was incised with sharp dissection. Right angle and Sewell retractor were placed between skin and forearm fascia.  The fascia released for approximately 2 cm proximal to the wrist crease under direct vision.  Canal was explored.  Area of compression to the nerve was noted.  The motor branch entered into muscle.  The ulnar nerve was then found proximally and followed distally releasing Guyon's canal to its entire aspect.  An enlargement of his ulnar artery was noted but was not thrombosed.  We did not proceed with any vascular reconstruction or consideration of such at his age.  The  wound was copiously irrigated with saline.  After releasing the ulnar nerve, the skin was then closed with interrupted 4-0 nylon sutures.  A separate incision was then made over the medial epicondyle of the right elbow and carried down through subcutaneous tissue.  Bleeders again electrocauterized with bipolar.  The dissection was carried down to the posterior aspect of Osborne's fascia.  This was released on its posterior aspect.  The ulnar nerve was identified.  The forearm fascia was dissected free from the overlying subcutaneous tissue and skin over the flexor carpi ulnaris.  A fasciotomy was then performed to the 2 muscle bellies and flexor carpi ulnaris.  The 2 muscle  bellies were then separated with blunt dissection.  A KMI guide for carpal tunnel release was then placed between the ulnar nerve and the deep fascia and the deep fascia was released for approximately 8 cm distally using angled ENT scissors of straight nature.  The dissection was then carried proximal. Retractors were again placed.  The brachial fascia was separated from the overlying skin  and subcutaneous tissue proximally.  The knee retractors were placed allowing visualization.  A KMI guide was then placed between the ulnar nerve proximally and the supra overlying fascia and the fascia was released proximally for the same distances distally. This was again done with the angled ENT scissors to allow adequate visualization.  Wounds were copiously irrigated with saline.  The elbow fully flexed and no subluxation to the nerve was noted.  After irrigation, the Osborne's fascia was then sutured to the posterior skin flap with interrupted 2-0 Vicryl sutures.  The subcutaneous tissue was closed with interrupted 2-0 Vicryl and the skin with interrupted 4-0 nylon sutures.  Sterile compressive dressing was applied.  On deflation of the tourniquet, all fingers immediately pinked.  He was taken to the recovery room for observation in satisfactory condition.  He will be discharged home to return to the [place] in 1 week on Vicodin.          ______________________________ Daryll Brod, M.D.     GK/MEDQ  D:  02/18/2015  T:  02/18/2015  Job:  [id number]

## 2015-02-18 NOTE — Anesthesia Preprocedure Evaluation (Addendum)
Anesthesia Evaluation  Patient identified by MRN, date of birth, ID band Patient awake    Reviewed: Allergy & Precautions, NPO status , Patient's Chart, lab work & pertinent test results  History of Anesthesia Complications Negative for: history of anesthetic complications  Airway Mallampati: I  TM Distance: >3 FB Neck ROM: Full    Dental  (+) Edentulous Upper, Edentulous Lower   Pulmonary neg pulmonary ROS,    Pulmonary exam normal       Cardiovascular hypertension, Pt. on medications     Neuro/Psych negative neurological ROS  negative psych ROS   GI/Hepatic negative GI ROS, Neg liver ROS,   Endo/Other  negative endocrine ROS  Renal/GU negative Renal ROS     Musculoskeletal   Abdominal   Peds  Hematology   Anesthesia Other Findings   Reproductive/Obstetrics                            Anesthesia Physical Anesthesia Plan  ASA: III  Anesthesia Plan: General   Post-op Pain Management:    Induction: Intravenous  Airway Management Planned: LMA  Additional Equipment:   Intra-op Plan:   Post-operative Plan: Extubation in OR  Informed Consent: I have reviewed the patients History and Physical, chart, labs and discussed the procedure including the risks, benefits and alternatives for the proposed anesthesia with the patient or authorized representative who has indicated his/her understanding and acceptance.   Dental advisory given  Plan Discussed with: Anesthesiologist, Surgeon and CRNA  Anesthesia Plan Comments:        Anesthesia Quick Evaluation

## 2015-02-18 NOTE — H&P (Signed)
Joshua Downs is an 81 year old right hand dominant male referred by Dr. Sherwood Gambler for a consultation with respect to pain and discomfort in his right thumb. He states the thumb is out of place. He has difficulty pinching and gripping with it. He is also complaining of numbness and tingling to his fingers on an intermittent basis especially when he drives or reads. This does awaken him almost 7 out of 7 nights. He relates no history of injury to the hand or neck. He has a history of parathyroid problems with the removal of 1 gland. He has a history of arthritis. He has no history of diabetes or gout. He has had surgery on his back by Dr. Sherwood Gambler. He has had his nerve conductions done by Dr. Zebedee Iba revealing a bilateral carpal tunnel syndrome with no response to the right median nerve at the carpal canal with a delay of 6.4 on the left, sensory delay of 3.9 on the right and 3.0 on the left. He has changes to the ulnar nerve with slight delay and diminution on his right side. It is normal on his left. He also shows cubital tunnel syndrome bilaterally with conduction velocity diminution 57 on the left, 34 on the right.   PAST MEDICAL HISTORY:  He has no known drug allergies. He is on the following medications: Denizen/HCTZ, aspirin, Occuvite, Centrum Silver, fish oil, Vitamin D.  He has had the following surgeries: Parathyroid gland removal, hernia, cancer removed from kidney, and back surgery.   FAMILY MEDICAL HISTORY: Positive for arthritis.  SOCIAL HISTORY:  He does not drink. He smokes advised to quit and the reasons behind this. He is widowed.  REVIEW OF SYSTEMS: Positive for glasses, otherwise negative 14 points.  Joshua Downs is an 81 y.o. male.   Chief Complaint: median and ulnar neuropathy right arm HPI: see above  Past Medical History  Diagnosis Date  . Kidney stones   . Cancer 2004    left kidney  . Arthritis   . Constipation     takes Dulcolax daily as needed as well as Colace  .  Insomnia     takes Melatonin nightly as well as Benadryl  . Hypertension     takes Lotensin daily  . Family history of anesthesia complication     son gets sick  . Weakness     and heaviness;notices more in left but some in right  . Chronic back pain     DDD/stenosis  . Joint swelling   . History of colon polyps   . Nocturia   . Enlarged prostate   . History of kidney stones   . Macular degeneration     takes Ocuvite and Fish Oil;dry  . Bradycardia     says normal HR runs about 40-44  . Dysrhythmia     right BBB    Past Surgical History  Procedure Laterality Date  . Hernia repair    . Parathyroidectomy    . Left kidney portion removed  2004  . Spot taken off of back  2008  . Colonoscopy    . Back surgery  2015  . Back surgery  10/2012    History reviewed. No pertinent family history. Social History:  reports that he has never smoked. His smokeless tobacco use includes Chew. He reports that he does not drink alcohol or use illicit drugs.  Allergies: No Known Allergies  No prescriptions prior to admission    Results for orders placed or performed during the  hospital encounter of 02/16/15 (from the past 48 hour(s))  Basic metabolic panel     Status: Abnormal   Collection Time: 02/16/15 10:00 AM  Result Value Ref Range   Sodium 141 135 - 145 mmol/L   Potassium 4.7 3.5 - 5.1 mmol/L   Chloride 102 96 - 112 mmol/L   CO2 32 19 - 32 mmol/L   Glucose, Bld 102 (H) 70 - 99 mg/dL   BUN 24 (H) 6 - 23 mg/dL   Creatinine, Ser 1.07 0.50 - 1.35 mg/dL   Calcium 9.2 8.4 - 10.5 mg/dL   GFR calc non Af Amer 63 (L) >90 mL/min   GFR calc Af Amer 73 (L) >90 mL/min    Comment: (NOTE) The eGFR has been calculated using the CKD EPI equation. This calculation has not been validated in all clinical situations. eGFR's persistently <90 mL/min signify possible Chronic Kidney Disease.    Anion gap 7 5 - 15    No results found.   Pertinent items are noted in HPI.  Height 6' (1.829  m), weight 82.555 kg (182 lb).  General appearance: alert, cooperative and appears stated age Head: Normocephalic, without obvious abnormality Neck: no JVD Resp: clear to auscultation bilaterally Cardio: regular rate and rhythm, S1, S2 normal, no murmur, click, rub or gallop GI: soft, non-tender; bowel sounds normal; no masses,  no organomegaly Extremities: numbness right hand Pulses: 2+ and symmetric Skin: Skin color, texture, turgor normal. No rashes or lesions Neurologic: Grossly normal Incision/Wound: na  Assessment/Plan X-rays reveal marked degenerative changes in multiple joints bilateral hands, most specifically at the IP joint of his thumb, DIP joints of the fingers.  DIAGNOSIS: (1) Generalized osteoarthritis with probable carpal tunnel syndrome. (2)  ulnar neuropathy 3. Carpal tunnel syndrome We have discussed the possibility of decompression of the carpal tunnel and cubital tunnel and possible transposition on the right side, exploration of Guyon's canal and decompression of the nerve. Pre, peri and post op care are discussed along with risks and complications. Patient is aware there is no guarantee with surgery, possibility of infection, injury to arteries, nerves, and tendons, incomplete relief and dystrophy.   Odile Veloso R 02/18/2015, 7:39 AM

## 2015-02-18 NOTE — Progress Notes (Signed)
Assisted Dr. Singer with right, ultrasound guided, interscalene  block. Side rails up, monitors on throughout procedure. See vital signs in flow sheet. Tolerated Procedure well. 

## 2015-02-18 NOTE — Op Note (Signed)
Joshua Downs, CINQUE NO.:  [id number]  MEDICAL RECORD NO.:  [id number]  LOCATION:  DOIB                          FACILITY:  [facility]  PHYSICIAN:  Daryll Brod, M.D.       DATE OF BIRTH:  05-27-1931  DATE OF PROCEDURE:  02/18/2015 DATE OF DISCHARGE:  02/18/2015                              OPERATIVE REPORT   PREOPERATIVE DIAGNOSIS:  Ulnar neuropathy at elbow and wrist, right hand with carpal tunnel syndrome, right hand.  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  OPERATION:  Decompression of median nerve at the wrist, ulnar nerve at the wrist, and ulnar nerve at the elbow with simple decompression, no transposition.  SURGEON:  Daryll Brod, M.D.  ASSISTANT:  Marily Lente. Dasnoit, PA-C.  ANESTHESIA:  Supraclavicular block general.  ANESTHESIOLOGIST:  Tobias Alexander.  HISTORY:  The patient is an 81 year old male with a history of numbness and tingling in his right arm, nerve conductions are positive for changes of the ulnar nerve at both the wrist and elbow and carpal tunnel syndrome.  This has not responded to conservative treatment.  He has elected to undergo surgical decompression to the nerve at the elbow with possible transposition with decompression of the ulnar nerve at carpal and median nerve at the wrist.  He is aware of risks and complications.  He is aware that there is no guarantee with the surgery, possibility of infection, recurrence of injury to arteries, nerves, tendons, incomplete release of symptoms and dystrophy, the possibility that we are hoping the process that it may not improve but hopefully not get worse.  In the preoperative area, the patient was seen, the extremity marked by both patient and surgeon.  Antibiotic given.  PROCEDURE IN DETAIL:  The patient was brought to the operating room, where a supraclavicular block will be given in the preoperative area by Dr. Tobias Alexander.   General anesthetic was given in the operating room.  He was prepped using ChloraPrep in supine position with the right arm free.  A 3-minute dry time was allowed.  Time-out taken, confirming the patient and procedure.  The limb was exsanguinated with an Esmarch bandage. Tourniquet placed high on the arm was inflated to 250 mmHg.  A longitudinal incision was made in the right palm, carried down through subcutaneous tissue,  bleeders were electrocauterized with bipolar. Palmar fascia was split.  Superficial palmar arch identified.  Flexor tendon of the ring and little finger identified to the ulnar side of the median nerve.  Carpal retinaculum was incised with sharp dissection. Right angle and Sewell retractor were placed between skin and forearm fascia.  The fascia released for approximately 2 cm proximal to the wrist crease under direct vision.  Canal was explored.  Area of compression to the nerve was noted.  The motor branch entered into muscle.  The ulnar nerve was then found proximally and followed distally releasing Guyon's canal to its entire aspect.  An enlargement of his ulnar artery was noted but was not thrombosed.  We did not proceed with any vascular reconstruction or consideration of such at his age.  The  wound was copiously irrigated with saline.  After releasing the ulnar nerve, the skin was then closed with interrupted 4-0 nylon sutures.  A separate incision was then made over the medial epicondyle of the right elbow and carried down through subcutaneous tissue.  Bleeders again electrocauterized with bipolar.  The dissection was carried down to the posterior aspect of Osborne's fascia.  This was released on its posterior aspect.  The ulnar nerve was identified.  The forearm fascia was dissected free from the overlying subcutaneous tissue and skin over the flexor carpi ulnaris.  A fasciotomy was then performed to the 2 muscle bellies and flexor carpi ulnaris.  The 2 muscle  bellies were then separated with blunt dissection.  A KMI guide for carpal tunnel release was then placed between the ulnar nerve and the deep fascia and the deep fascia was released for approximately 8 cm distally using angled ENT scissors of straight nature.  The dissection was then carried proximal. Retractors were again placed.  The brachial fascia was separated from the overlying skin  and subcutaneous tissue proximally.  The knee retractors were placed allowing visualization.  A KMI guide was then placed between the ulnar nerve proximally and the supra overlying fascia and the fascia was released proximally for the same distances distally. This was again done with the angled ENT scissors to allow adequate visualization.  Wounds were copiously irrigated with saline.  The elbow fully flexed and no subluxation to the nerve was noted.  After irrigation, the Osborne's fascia was then sutured to the posterior skin flap with interrupted 2-0 Vicryl sutures.  The subcutaneous tissue was closed with interrupted 2-0 Vicryl and the skin with interrupted 4-0 nylon sutures.  Sterile compressive dressing was applied.  On deflation of the tourniquet, all fingers immediately pinked.  He was taken to the recovery room for observation in satisfactory condition.  He will be discharged home to return to the [facility] in 1 week on Vicodin.          ______________________________ Daryll Brod, M.D.     GK/MEDQ  D:  02/18/2015  T:  02/18/2015  Job:  [id number]

## 2015-02-18 NOTE — Transfer of Care (Signed)
Immediate Anesthesia Transfer of Care Note  Patient: MOHAB ASHBY  Procedure(s) Performed: Procedure(s): RIGHT WRIST CARPAL TUNNEL RELEASE (Right) DECOMPRESSION ULNAR NERVE RIGHT ELBOW (Right)  RIGHT GUYON'S CANAL RELEASE (Right)  Patient Location: PACU  Anesthesia Type:GA combined with regional for post-op pain  Level of Consciousness: awake, oriented and patient cooperative  Airway & Oxygen Therapy: Patient Spontanous Breathing and Patient connected to face mask oxygen  Post-op Assessment: Report given to RN and Post -op Vital signs reviewed and stable  Post vital signs: Reviewed and stable  Last Vitals:  Filed Vitals:   02/18/15 1010  BP:   Pulse: 46  Temp:   Resp: 14    Complications: No apparent anesthesia complications

## 2015-02-19 NOTE — Addendum Note (Signed)
Addendum  created 02/19/15 1414 by Tawni Millers, CRNA   Modules edited: Charges VN

## 2015-02-22 ENCOUNTER — Encounter (HOSPITAL_BASED_OUTPATIENT_CLINIC_OR_DEPARTMENT_OTHER): Payer: Self-pay | Admitting: Orthopedic Surgery

## 2015-04-01 ENCOUNTER — Other Ambulatory Visit: Payer: Self-pay | Admitting: Orthopedic Surgery

## 2015-04-12 ENCOUNTER — Encounter (HOSPITAL_BASED_OUTPATIENT_CLINIC_OR_DEPARTMENT_OTHER): Payer: Self-pay | Admitting: *Deleted

## 2015-04-12 ENCOUNTER — Encounter (HOSPITAL_COMMUNITY)
Admission: RE | Admit: 2015-04-12 | Discharge: 2015-04-12 | Disposition: A | Discharge status: Home / Self Care | Payer: Medicare Other | Source: Ambulatory Visit | Attending: Orthopedic Surgery | Admitting: Orthopedic Surgery

## 2015-04-12 DIAGNOSIS — M549 Dorsalgia, unspecified: Secondary | ICD-10-CM | POA: Diagnosis not present

## 2015-04-12 DIAGNOSIS — N400 Enlarged prostate without lower urinary tract symptoms: Secondary | ICD-10-CM | POA: Diagnosis not present

## 2015-04-12 DIAGNOSIS — G4700 Insomnia, unspecified: Secondary | ICD-10-CM | POA: Diagnosis not present

## 2015-04-12 DIAGNOSIS — R001 Bradycardia, unspecified: Secondary | ICD-10-CM | POA: Diagnosis not present

## 2015-04-12 DIAGNOSIS — Z87442 Personal history of urinary calculi: Secondary | ICD-10-CM | POA: Diagnosis not present

## 2015-04-12 DIAGNOSIS — Z86010 Personal history of colonic polyps: Secondary | ICD-10-CM | POA: Diagnosis not present

## 2015-04-12 DIAGNOSIS — Z79899 Other long term (current) drug therapy: Secondary | ICD-10-CM | POA: Diagnosis not present

## 2015-04-12 DIAGNOSIS — G5602 Carpal tunnel syndrome, left upper limb: Secondary | ICD-10-CM | POA: Diagnosis present

## 2015-04-12 DIAGNOSIS — F17200 Nicotine dependence, unspecified, uncomplicated: Secondary | ICD-10-CM | POA: Diagnosis not present

## 2015-04-12 DIAGNOSIS — R351 Nocturia: Secondary | ICD-10-CM | POA: Diagnosis not present

## 2015-04-12 DIAGNOSIS — I10 Essential (primary) hypertension: Secondary | ICD-10-CM | POA: Diagnosis not present

## 2015-04-12 DIAGNOSIS — K5900 Constipation, unspecified: Secondary | ICD-10-CM | POA: Diagnosis not present

## 2015-04-12 DIAGNOSIS — Z7982 Long term (current) use of aspirin: Secondary | ICD-10-CM | POA: Diagnosis not present

## 2015-04-12 DIAGNOSIS — G8929 Other chronic pain: Secondary | ICD-10-CM | POA: Diagnosis not present

## 2015-04-12 DIAGNOSIS — I4510 Unspecified right bundle-branch block: Secondary | ICD-10-CM | POA: Diagnosis not present

## 2015-04-12 DIAGNOSIS — E892 Postprocedural hypoparathyroidism: Secondary | ICD-10-CM | POA: Diagnosis not present

## 2015-04-12 DIAGNOSIS — Z8489 Family history of other specified conditions: Secondary | ICD-10-CM | POA: Diagnosis not present

## 2015-04-12 DIAGNOSIS — H3530 Unspecified macular degeneration: Secondary | ICD-10-CM | POA: Diagnosis not present

## 2015-04-12 LAB — BASIC METABOLIC PANEL
ANION GAP: 8 (ref 5–15)
BUN: 24 mg/dL — ABNORMAL HIGH (ref 6–20)
CO2: 27 mmol/L (ref 22–32)
Calcium: 8.9 mg/dL (ref 8.9–10.3)
Chloride: 103 mmol/L (ref 101–111)
Creatinine, Ser: 1.22 mg/dL (ref 0.61–1.24)
GFR, EST NON AFRICAN AMERICAN: 53 mL/min — AB (ref >60)
GLUCOSE: 191 mg/dL — AB (ref 65–99)
POTASSIUM: 4 mmol/L (ref 3.5–5.1)
SODIUM: 138 mmol/L (ref 135–145)

## 2015-04-13 ENCOUNTER — Ambulatory Visit (HOSPITAL_BASED_OUTPATIENT_CLINIC_OR_DEPARTMENT_OTHER): Payer: Medicare Other | Admitting: Anesthesiology

## 2015-04-13 ENCOUNTER — Encounter (HOSPITAL_BASED_OUTPATIENT_CLINIC_OR_DEPARTMENT_OTHER): Payer: Self-pay | Admitting: Orthopedic Surgery

## 2015-04-13 ENCOUNTER — Ambulatory Visit (HOSPITAL_BASED_OUTPATIENT_CLINIC_OR_DEPARTMENT_OTHER)
Admission: RE | Admit: 2015-04-13 | Discharge: 2015-04-13 | Disposition: A | Discharge status: Home / Self Care | Payer: Medicare Other | Source: Ambulatory Visit | Attending: Orthopedic Surgery | Admitting: Orthopedic Surgery

## 2015-04-13 ENCOUNTER — Encounter (HOSPITAL_BASED_OUTPATIENT_CLINIC_OR_DEPARTMENT_OTHER)
Admission: RE | Disposition: A | Discharge status: Home / Self Care | Payer: Self-pay | Source: Ambulatory Visit | Attending: Orthopedic Surgery

## 2015-04-13 DIAGNOSIS — Z87442 Personal history of urinary calculi: Secondary | ICD-10-CM | POA: Insufficient documentation

## 2015-04-13 DIAGNOSIS — Z86010 Personal history of colonic polyps: Secondary | ICD-10-CM | POA: Insufficient documentation

## 2015-04-13 DIAGNOSIS — R001 Bradycardia, unspecified: Secondary | ICD-10-CM | POA: Insufficient documentation

## 2015-04-13 DIAGNOSIS — Z8489 Family history of other specified conditions: Secondary | ICD-10-CM | POA: Insufficient documentation

## 2015-04-13 DIAGNOSIS — F17200 Nicotine dependence, unspecified, uncomplicated: Secondary | ICD-10-CM | POA: Insufficient documentation

## 2015-04-13 DIAGNOSIS — Z79899 Other long term (current) drug therapy: Secondary | ICD-10-CM | POA: Insufficient documentation

## 2015-04-13 DIAGNOSIS — Z7982 Long term (current) use of aspirin: Secondary | ICD-10-CM | POA: Insufficient documentation

## 2015-04-13 DIAGNOSIS — I10 Essential (primary) hypertension: Secondary | ICD-10-CM | POA: Diagnosis not present

## 2015-04-13 DIAGNOSIS — G8929 Other chronic pain: Secondary | ICD-10-CM | POA: Insufficient documentation

## 2015-04-13 DIAGNOSIS — H3530 Unspecified macular degeneration: Secondary | ICD-10-CM | POA: Insufficient documentation

## 2015-04-13 DIAGNOSIS — K5900 Constipation, unspecified: Secondary | ICD-10-CM | POA: Insufficient documentation

## 2015-04-13 DIAGNOSIS — N400 Enlarged prostate without lower urinary tract symptoms: Secondary | ICD-10-CM | POA: Insufficient documentation

## 2015-04-13 DIAGNOSIS — G4700 Insomnia, unspecified: Secondary | ICD-10-CM | POA: Insufficient documentation

## 2015-04-13 DIAGNOSIS — G5602 Carpal tunnel syndrome, left upper limb: Secondary | ICD-10-CM | POA: Insufficient documentation

## 2015-04-13 DIAGNOSIS — R351 Nocturia: Secondary | ICD-10-CM | POA: Insufficient documentation

## 2015-04-13 DIAGNOSIS — E892 Postprocedural hypoparathyroidism: Secondary | ICD-10-CM | POA: Insufficient documentation

## 2015-04-13 DIAGNOSIS — M549 Dorsalgia, unspecified: Secondary | ICD-10-CM | POA: Insufficient documentation

## 2015-04-13 DIAGNOSIS — I4510 Unspecified right bundle-branch block: Secondary | ICD-10-CM | POA: Insufficient documentation

## 2015-04-13 HISTORY — PX: CARPAL TUNNEL RELEASE: SHX101

## 2015-04-13 LAB — POCT HEMOGLOBIN-HEMACUE: Hemoglobin: 14.3 g/dL (ref 13.0–17.0)

## 2015-04-13 SURGERY — CARPAL TUNNEL RELEASE
Anesthesia: Monitor Anesthesia Care | Laterality: Left

## 2015-04-13 MED ORDER — MIDAZOLAM HCL 2 MG/2ML IJ SOLN: 1.0000 mg | INTRAMUSCULAR | Status: DC | PRN

## 2015-04-13 MED ORDER — CEFAZOLIN SODIUM-DEXTROSE 2-3 GM-% IV SOLR
INTRAVENOUS | Status: AC
  Filled 2015-04-13: qty 50

## 2015-04-13 MED ORDER — CHLORHEXIDINE GLUCONATE 4 % EX LIQD: 60.0000 mL | Freq: Once | CUTANEOUS | Status: DC

## 2015-04-13 MED ORDER — PROPOFOL 10 MG/ML IV BOLUS
INTRAVENOUS | Status: AC
  Filled 2015-04-13: qty 40

## 2015-04-13 MED ORDER — CEFAZOLIN SODIUM-DEXTROSE 2-3 GM-% IV SOLR
2.0000 g | INTRAVENOUS | Status: AC
  Administered 2015-04-13: 2 g via INTRAVENOUS

## 2015-04-13 MED ORDER — PROPOFOL INFUSION 10 MG/ML OPTIME
INTRAVENOUS | Status: DC | PRN
  Administered 2015-04-13: 75 ug/kg/min via INTRAVENOUS

## 2015-04-13 MED ORDER — SODIUM CHLORIDE 0.9 % IR SOLN
Status: DC | PRN
  Administered 2015-04-13: 200 mL

## 2015-04-13 MED ORDER — HYDROCODONE-ACETAMINOPHEN 5-325 MG PO TABS: 1.0000 | ORAL_TABLET | Freq: Four times a day (QID) | ORAL | Status: DC | PRN

## 2015-04-13 MED ORDER — CEFAZOLIN SODIUM-DEXTROSE 2-3 GM-% IV SOLR: 2.0000 g | INTRAVENOUS | Status: DC

## 2015-04-13 MED ORDER — PROMETHAZINE HCL 25 MG/ML IJ SOLN: 6.2500 mg | INTRAMUSCULAR | Status: DC | PRN

## 2015-04-13 MED ORDER — BUPIVACAINE HCL (PF) 0.25 % IJ SOLN
INTRAMUSCULAR | Status: DC | PRN
  Administered 2015-04-13: 7 mL

## 2015-04-13 MED ORDER — FENTANYL CITRATE (PF) 100 MCG/2ML IJ SOLN: 25.0000 ug | INTRAMUSCULAR | Status: DC | PRN

## 2015-04-13 MED ORDER — SCOPOLAMINE 1 MG/3DAYS TD PT72: 1.0000 | MEDICATED_PATCH | Freq: Once | TRANSDERMAL | Status: DC | PRN

## 2015-04-13 MED ORDER — FENTANYL CITRATE (PF) 100 MCG/2ML IJ SOLN
INTRAMUSCULAR | Status: DC | PRN
  Administered 2015-04-13 (×2): 50 ug via INTRAVENOUS

## 2015-04-13 MED ORDER — ONDANSETRON HCL 4 MG/2ML IJ SOLN
INTRAMUSCULAR | Status: DC | PRN
  Administered 2015-04-13: 4 mg via INTRAVENOUS

## 2015-04-13 MED ORDER — ACETAMINOPHEN 500 MG PO TABS: 1000.0000 mg | ORAL_TABLET | Freq: Once | ORAL | Status: DC

## 2015-04-13 MED ORDER — LACTATED RINGERS IV SOLN
INTRAVENOUS | Status: DC
  Administered 2015-04-13: 10:00:00 via INTRAVENOUS

## 2015-04-13 MED ORDER — LACTATED RINGERS IV SOLN: 500.0000 mL | INTRAVENOUS | Status: DC

## 2015-04-13 MED ORDER — ACETAMINOPHEN 160 MG/5ML PO SOLN: 960.0000 mg | Freq: Once | ORAL | Status: DC

## 2015-04-13 MED ORDER — CHLORHEXIDINE GLUCONATE 4 % EX LIQD
60.0000 mL | Freq: Once | CUTANEOUS | Status: DC
Start: 2015-04-13 — End: 2015-04-13

## 2015-04-13 MED ORDER — FENTANYL CITRATE (PF) 100 MCG/2ML IJ SOLN
INTRAMUSCULAR | Status: AC
  Filled 2015-04-13: qty 4

## 2015-04-13 SURGICAL SUPPLY — 41 items
BLADE SURG 15 STRL LF DISP TIS (BLADE) ×1 IMPLANT
BLADE SURG 15 STRL SS (BLADE) ×3
BNDG CMPR 9X4 STRL LF SNTH (GAUZE/BANDAGES/DRESSINGS)
BNDG COHESIVE 3X5 TAN STRL LF (GAUZE/BANDAGES/DRESSINGS) ×3 IMPLANT
BNDG ESMARK 4X9 LF (GAUZE/BANDAGES/DRESSINGS) IMPLANT
BNDG GAUZE ELAST 4 BULKY (GAUZE/BANDAGES/DRESSINGS) ×3 IMPLANT
CHLORAPREP W/TINT 26ML (MISCELLANEOUS) ×3 IMPLANT
CORDS BIPOLAR (ELECTRODE) ×3 IMPLANT
COVER BACK TABLE 60X90IN (DRAPES) ×3 IMPLANT
COVER MAYO STAND STRL (DRAPES) ×3 IMPLANT
CUFF TOURNIQUET SINGLE 18IN (TOURNIQUET CUFF) ×3 IMPLANT
DRAPE EXTREMITY T 121X128X90 (DRAPE) ×3 IMPLANT
DRAPE SURG 17X23 STRL (DRAPES) ×3 IMPLANT
DRSG PAD ABDOMINAL 8X10 ST (GAUZE/BANDAGES/DRESSINGS) ×3 IMPLANT
GAUZE SPONGE 4X4 12PLY STRL (GAUZE/BANDAGES/DRESSINGS) ×3 IMPLANT
GAUZE XEROFORM 1X8 LF (GAUZE/BANDAGES/DRESSINGS) ×3 IMPLANT
GLOVE BIOGEL PI IND STRL 7.0 (GLOVE) ×1 IMPLANT
GLOVE BIOGEL PI IND STRL 7.5 (GLOVE) ×1 IMPLANT
GLOVE BIOGEL PI IND STRL 8.5 (GLOVE) ×1 IMPLANT
GLOVE BIOGEL PI INDICATOR 7.0 (GLOVE) ×2
GLOVE BIOGEL PI INDICATOR 7.5 (GLOVE) ×2
GLOVE BIOGEL PI INDICATOR 8.5 (GLOVE) ×2
GLOVE ECLIPSE 6.5 STRL STRAW (GLOVE) ×2 IMPLANT
GLOVE EXAM NITRILE LRG STRL (GLOVE) ×2 IMPLANT
GLOVE EXAM NITRILE MD LF STRL (GLOVE) ×2 IMPLANT
GLOVE SURG ORTHO 8.0 STRL STRW (GLOVE) ×3 IMPLANT
GLOVE SURG SS PI 7.0 STRL IVOR (GLOVE) ×3 IMPLANT
GOWN STRL REUS W/ TWL LRG LVL3 (GOWN DISPOSABLE) ×1 IMPLANT
GOWN STRL REUS W/TWL LRG LVL3 (GOWN DISPOSABLE) ×6
GOWN STRL REUS W/TWL XL LVL3 (GOWN DISPOSABLE) ×3 IMPLANT
NDL PRECISIONGLIDE 27X1.5 (NEEDLE) IMPLANT
NEEDLE PRECISIONGLIDE 27X1.5 (NEEDLE) IMPLANT
NS IRRIG 1000ML POUR BTL (IV SOLUTION) ×3 IMPLANT
PACK BASIN DAY SURGERY FS (CUSTOM PROCEDURE TRAY) ×3 IMPLANT
STOCKINETTE 4X48 STRL (DRAPES) ×3 IMPLANT
SUT ETHILON 4 0 PS 2 18 (SUTURE) ×3 IMPLANT
SUT VICRYL 4-0 PS2 18IN ABS (SUTURE) IMPLANT
SYR BULB 3OZ (MISCELLANEOUS) ×3 IMPLANT
SYR CONTROL 10ML LL (SYRINGE) IMPLANT
TOWEL OR 17X24 6PK STRL BLUE (TOWEL DISPOSABLE) ×6 IMPLANT
UNDERPAD 30X30 (UNDERPADS AND DIAPERS) ×3 IMPLANT

## 2015-04-13 NOTE — Anesthesia Preprocedure Evaluation (Signed)
Anesthesia Evaluation  Patient identified by MRN, date of birth, ID band Patient awake    Reviewed: Allergy & Precautions, NPO status , Patient's Chart, lab work & pertinent test results  History of Anesthesia Complications Negative for: history of anesthetic complications  Airway Mallampati: I  TM Distance: >3 FB Neck ROM: Full    Dental  (+) Edentulous Upper, Edentulous Lower   Pulmonary neg pulmonary ROS,  breath sounds clear to auscultation  Pulmonary exam normal       Cardiovascular hypertension, Pt. on medications Normal cardiovascular examRhythm:regular Rate:Normal     Neuro/Psych negative neurological ROS  negative psych ROS   GI/Hepatic negative GI ROS, Neg liver ROS,   Endo/Other  negative endocrine ROS  Renal/GU      Musculoskeletal  (+) Arthritis -,   Abdominal   Peds  Hematology   Anesthesia Other Findings   Reproductive/Obstetrics                             Anesthesia Physical Anesthesia Plan  ASA: III  Anesthesia Plan: MAC and Bier Block   Post-op Pain Management:    Induction:   Airway Management Planned: Natural Airway  Additional Equipment:   Intra-op Plan:   Post-operative Plan:   Informed Consent: I have reviewed the patients History and Physical, chart, labs and discussed the procedure including the risks, benefits and alternatives for the proposed anesthesia with the patient or authorized representative who has indicated his/her understanding and acceptance.     Plan Discussed with: CRNA and Surgeon  Anesthesia Plan Comments:         Anesthesia Quick Evaluation

## 2015-04-13 NOTE — H&P (Signed)
Joshua Downs is an 81 year old right hand dominant male referred by Dr. Sherwood Gambler for a consultation with respect to pain and discomfort in his right thumb. He states the thumb is out of place. He has difficulty pinching and gripping with it. He is also complaining of numbness and tingling to his fingers on an intermittent basis especially when he drives or reads. This does awaken him almost 7 out of 7 nights. He relates no history of injury to the hand or neck. He has a history of parathyroid problems with the removal of 1 gland. He has a history of arthritis. He has no history of diabetes or gout. He has had surgery on his back by Dr. Sherwood Gambler. He is post carpal tunnel release, cubital tunnel release, right hand.  This is doing extremely well.  He has full flexion/extension of his fingers  PAST MEDICAL HISTORY:  He has no known drug allergies. He is on the following medications: Denizen/HCTZ, aspirin, Occuvite, Centrum Silver, fish oil, Vitamin D.  He has had the following surgeries: Parathyroid gland removal, hernia, cancer removed from kidney, and back surgery.   FAMILY MEDICAL HISTORY: Positive for arthritis.  SOCIAL HISTORY:  He does not drink. He smokes advised to quit and the reasons behind this. He is widowed.  REVIEW OF SYSTEMS: Positive for glasses, otherwise negative 14 points.  Joshua Downs is an 81 y.o. male.   Chief Complaint: CTS left HPI: see above  Past Medical History  Diagnosis Date  . Kidney stones   . Cancer 2004    left kidney  . Arthritis   . Constipation     takes Dulcolax daily as needed as well as Colace  . Insomnia     takes Melatonin nightly as well as Benadryl  . Hypertension     takes Lotensin daily  . Family history of anesthesia complication     son gets sick  . Weakness     and heaviness;notices more in left but some in right  . Chronic back pain     DDD/stenosis  . Joint swelling   . History of colon polyps   . Nocturia   . Enlarged prostate   .  History of kidney stones   . Macular degeneration     takes Ocuvite and Fish Oil;dry  . Bradycardia     says normal HR runs about 40-44  . Dysrhythmia     right BBB    Past Surgical History  Procedure Laterality Date  . Hernia repair    . Parathyroidectomy    . Left kidney portion removed  2004  . Spot taken off of back  2008  . Colonoscopy    . Back surgery  2015  . Back surgery  10/2012  . Carpal tunnel release Right 02/18/2015    Procedure: RIGHT WRIST CARPAL TUNNEL RELEASE;  Surgeon: Daryll Brod, MD;  Location: [place];  Service: Orthopedics;  Laterality: Right;  . Ulnar nerve transposition Right 02/18/2015    Procedure: DECOMPRESSION ULNAR NERVE RIGHT ELBOW;  Surgeon: Daryll Brod, MD;  Location: [facility];  Service: Orthopedics;  Laterality: Right;    History reviewed. No pertinent family history. Social History:  reports that he has never smoked. His smokeless tobacco use includes Chew. He reports that he does not drink alcohol or use illicit drugs.  Allergies: No Known Allergies  Medications Prior to Admission  Medication Sig Dispense Refill  . acetaminophen (TYLENOL) 500 MG tablet Take 1,000 mg  by mouth 2 (two) times daily.     Marland Kitchen aspirin EC 81 MG tablet Take 81 mg by mouth daily.    . benazepril-hydrochlorthiazide (LOTENSIN HCT) 20-12.5 MG per tablet Take 1 tablet by mouth daily.    . beta carotene w/minerals (OCUVITE) tablet Take 1 tablet by mouth daily.    . diphenhydrAMINE (BENADRYL) 25 MG tablet Take 25 mg by mouth at bedtime.     . docusate sodium (COLACE) 100 MG capsule Take 100 mg by mouth 2 (two) times daily as needed for mild constipation.    Marland Kitchen HYDROcodone-acetaminophen (NORCO) 5-325 MG per tablet Take 1 tablet by mouth every 6 (six) hours as needed for moderate pain. 30 tablet 0  . Melatonin 10 MG TABS Take 10 mg by mouth at bedtime.    . Multiple Vitamins-Minerals (CENTRUM PO) Take 1 tablet by mouth daily.    . Omega-3 Fatty  Acids (FISH OIL) 1200 MG CAPS Take 1 capsule by mouth 2 (two) times daily.      Results for orders placed or performed during the hospital encounter of 04/12/15 (from the past 48 hour(s))  Basic metabolic panel     Status: Abnormal   Collection Time: 04/12/15  1:23 PM  Result Value Ref Range   Sodium 138 135 - 145 mmol/L   Potassium 4.0 3.5 - 5.1 mmol/L   Chloride 103 101 - 111 mmol/L   CO2 27 22 - 32 mmol/L   Glucose, Bld 191 (H) 65 - 99 mg/dL   BUN 24 (H) 6 - 20 mg/dL   Creatinine, Ser 1.22 0.61 - 1.24 mg/dL   Calcium 8.9 8.9 - 10.3 mg/dL   GFR calc non Af Amer 53 (L) >60 mL/min   GFR calc Af Amer >60 >60 mL/min    Comment: (NOTE) The eGFR has been calculated using the CKD EPI equation. This calculation has not been validated in all clinical situations. eGFR's persistently <60 mL/min signify possible Chronic Kidney Disease.    Anion gap 8 5 - 15    No results found.   Pertinent items are noted in HPI.  Height 6' (1.829 m), weight 83.915 kg (185 lb).  General appearance: alert, cooperative and appears stated age Head: Normocephalic, without obvious abnormality Neck: no JVD Resp: clear to auscultation bilaterally Cardio: regular rate and rhythm, S1, S2 normal, no murmur, click, rub or gallop GI: soft, non-tender; bowel sounds normal; no masses,  no organomegaly Extremities: numbness left hand Pulses: 2+ and symmetric Skin: Skin color, texture, turgor normal. No rashes or lesions Neurologic: Grossly normal Incision/Wound: na  Assessment/Plan Diagnosis: cts left He would like to proceed to have the left side done.  He is status post carpal tunnel, cubital tunnel release on his right side with good result.  He is scheduled for release of carpal tunnel left hand as an outpatient under regional anesthesia.  Zhyon Antenucci R 04/13/2015, 9:36 AM

## 2015-04-13 NOTE — Transfer of Care (Signed)
Immediate Anesthesia Transfer of Care Note  Patient: Joshua Downs  Procedure(s) Performed: Procedure(s): LEFT CARPAL TUNNEL RELEASE (Left)  Patient Location: PACU  Anesthesia Type:Bier block  Level of Consciousness: alert , sedated and patient cooperative  Airway & Oxygen Therapy: Patient Spontanous Breathing and Patient connected to face mask oxygen  Post-op Assessment: Report given to RN and Post -op Vital signs reviewed and stable  Post vital signs: Reviewed and stable  Last Vitals:  Filed Vitals:   04/13/15 0939  BP: 104/60  Pulse: 57  Temp: 36.6 C  Resp: 16    Complications: No apparent anesthesia complications

## 2015-04-13 NOTE — Brief Op Note (Signed)
04/13/2015  10:58 AM  PATIENT:  Joshua Downs  81 y.o. male  PRE-OPERATIVE DIAGNOSIS:  Left carpal tunnel syndrome    G56.02  POST-OPERATIVE DIAGNOSIS:  Left carpal tunnel syndrome    G56.02  PROCEDURE:  Procedure(s): LEFT CARPAL TUNNEL RELEASE (Left)  SURGEON:  Surgeon(s) and Role:    * Daryll Brod, MD - Primary  PHYSICIAN ASSISTANT:   ASSISTANTS: none   ANESTHESIA:   local and regional  EBL:  Total I/O In: 400 [I.V.:400] Out: -   BLOOD ADMINISTERED:none  DRAINS: none   LOCAL MEDICATIONS USED:  BUPIVICAINE   SPECIMEN:  No Specimen  DISPOSITION OF SPECIMEN:  N/A  COUNTS:  YES  TOURNIQUET:   Total Tourniquet Time Documented: Forearm (Left) - 20 minutes Total: Forearm (Left) - 20 minutes   DICTATION: .Other Dictation: Dictation Number [id number]  PLAN OF CARE: Discharge to home after PACU  PATIENT DISPOSITION:  PACU - hemodynamically stable.

## 2015-04-13 NOTE — Op Note (Signed)
Dictation Number [id number]

## 2015-04-13 NOTE — Anesthesia Postprocedure Evaluation (Signed)
  Anesthesia Post-op Note  Patient: Joshua Downs  Procedure(s) Performed: Procedure(s): LEFT CARPAL TUNNEL RELEASE (Left)  Patient Location: PACU  Anesthesia Type:MAC and Regional  Level of Consciousness: awake and alert   Airway and Oxygen Therapy: Patient Spontanous Breathing  Post-op Pain: none  Post-op Assessment: Post-op Vital signs reviewed              Post-op Vital Signs: stable  Last Vitals:  Filed Vitals:   04/13/15 1131  BP: 123/66  Pulse: 51  Temp: 36.6 C  Resp: 16    Complications: No apparent anesthesia complications

## 2015-04-13 NOTE — Discharge Instructions (Addendum)

## 2015-04-14 ENCOUNTER — Encounter (HOSPITAL_BASED_OUTPATIENT_CLINIC_OR_DEPARTMENT_OTHER): Payer: Self-pay | Admitting: Orthopedic Surgery

## 2015-04-14 NOTE — Op Note (Signed)
Joshua Downs, Joshua Downs NO.:  [id number]  MEDICAL RECORD NO.:  [id number]  LOCATION:                               FACILITY:  [facility]  PHYSICIAN:  Daryll Brod, M.D.       DATE OF BIRTH:  Mar 23, 1931  DATE OF PROCEDURE:  04/13/2015 DATE OF DISCHARGE:  04/13/2015                              OPERATIVE REPORT   PREOPERATIVE DIAGNOSIS:  Carpal tunnel syndrome, left hand.  POSTOPERATIVE DIAGNOSIS:  Carpal tunnel syndrome, left hand.  OPERATION:  Decompression of left median nerve.  SURGEON:  Daryll Brod, MD.  ANESTHESIA:  Forearm-based IV regional with local infiltration.  ANESTHESIOLOGIST:  Ala Dach, M.D.  HISTORY:  The patient is an 81 year old male with a history of carpal tunnel syndrome, nerve conduction is positive.  He has not responded to conservative treatment and has elected to undergo surgical release of the median nerve of his left wrist.  Pre, peri, and postoperative course have been discussed along with risks and complications.  He is aware that there is no guarantee with the surgery, possibility of infection, recurrence of injury to arteries, nerves, tendons, incomplete relief of symptoms, dystrophy.  In preoperative area, the patient was seen, the extremity marked by both patient and surgeon.  Antibiotic given.  PROCEDURE IN DETAIL:  The patient was brought to the operating room, where forearm-based IV regional anesthetic was carried out without difficulty.  He was prepped using ChloraPrep, supine position, left arm free.  A 3-minute dry time was allowed.  Time-out taken, confirming the patient and procedure.  A longitudinal incision was made in the left palm, carried down through subcutaneous tissue.  Bleeders were electrocauterized.  Had some filling of local infiltration with 0.25% bupivacaine without epinephrine was given, approximately 6 mL was used. Dissection was carried down to the palmar fascia.  This was split bluntly.  A  superficial palmar arch identified.  The flexor tendon to the ring and little finger identified to the ulnar side of the median nerve.  Carpal retinaculum was incised with sharp dissection after placement and protected retractors.  Distal forearm fascia was then released beneath the skin.  After placement Sewall retractor and right- angle retractor, this allowed visualization of the forearm fascia.  This was released for approximately 2-3 cm proximal to the wrist crease under direct vision.  Canal was explored.  Area of compression to the nerve was apparent.  Persistent median artery was present.  Motor branch entered into muscle.  No further lesions were identified.  The wound was irrigated with saline and the skin closed with interrupted 4-0 nylon suture.  A further local infiltration was given total approximately 8 mL.  Sterile compressive dressing with the fingers free applied.  On deflation of the tourniquet, all fingers immediately pinked.  He was taken to the recovery room for observation in satisfactory condition.  He will be discharged home to return to the [facility] in 1 week on Norco.    ______________________________ Daryll Brod, M.D.   ______________________________ Daryll Brod, M.D.    GK/MEDQ  D:  04/13/2015  T:  04/14/2015  Job:  [id number]

## 2016-05-01 ENCOUNTER — Encounter (INDEPENDENT_AMBULATORY_CARE_PROVIDER_SITE_OTHER): Payer: Self-pay | Admitting: Internal Medicine

## 2016-05-23 ENCOUNTER — Encounter (INDEPENDENT_AMBULATORY_CARE_PROVIDER_SITE_OTHER): Payer: Self-pay | Admitting: Internal Medicine

## 2016-05-23 ENCOUNTER — Other Ambulatory Visit (INDEPENDENT_AMBULATORY_CARE_PROVIDER_SITE_OTHER): Payer: Self-pay | Admitting: Internal Medicine

## 2016-05-23 ENCOUNTER — Encounter (INDEPENDENT_AMBULATORY_CARE_PROVIDER_SITE_OTHER): Payer: Self-pay | Admitting: *Deleted

## 2016-05-23 ENCOUNTER — Encounter (INDEPENDENT_AMBULATORY_CARE_PROVIDER_SITE_OTHER): Payer: Self-pay

## 2016-05-23 ENCOUNTER — Ambulatory Visit (INDEPENDENT_AMBULATORY_CARE_PROVIDER_SITE_OTHER): Payer: Medicare Other | Admitting: Internal Medicine

## 2016-05-23 VITALS — BP 124/70 | HR 60 | Temp 98.0°F | Ht 72.0 in | Wt 181.6 lb

## 2016-05-23 DIAGNOSIS — K921 Melena: Secondary | ICD-10-CM

## 2016-05-23 DIAGNOSIS — I10 Essential (primary) hypertension: Secondary | ICD-10-CM | POA: Insufficient documentation

## 2016-05-23 DIAGNOSIS — M1990 Unspecified osteoarthritis, unspecified site: Secondary | ICD-10-CM

## 2016-05-23 NOTE — Patient Instructions (Signed)
EGD. The risks and benefits such as perforation, bleeding, and infection were reviewed with the patient and is agreeable. 

## 2016-05-23 NOTE — Progress Notes (Signed)
Subjective:    Patient ID: Joshua Downs, male    DOB: 04/12/1931, 82 y.o.   MRN: [id number]  HPIReferred by Dr. Scotty Court for melena.  About 3 weeks ago he had black stools x 3 days.  Went to Dr Rayna Sexton office and his nurse told patient to stop the ASA and Diclofenac. He saw Dr. Scotty Court one week after this and patient states his stool was normal. He has not seen any black stools since. His stools are normal. No acid reflux. Appetite is good. No weight loss. No abdominal pain.  ASA and Voltaren has been on hold since he noticed the melena.  04/25/2016 H and H 12.0 and 35.9 Last colonoscopy in 2008 by Dr. Gala Romney:  IMPRESSION:  1. Normal rectum.  2. Sigmoid diverticula (few), diminutive polyp transverse colon      removed as described above, otherwise, normal colon.  COLON, TRANSVERSE POLYP: BENIGN COLONIC MUCOSA WITH BENIGN LYMPHOID AGGREGATE. NO ADENOMATOUS CHANGE OR EVIDENCE OF MALIGNANCY.    Review of Systems Past Medical History:  Diagnosis Date  . Arthritis   . Arthritis   . Bradycardia    says normal HR runs about 40-44  . Cancer [facility]) 2004   left kidney  . Chronic back pain    DDD/stenosis  . Constipation    takes Dulcolax daily as needed as well as Colace  . Dysrhythmia    right BBB  . Enlarged prostate   . Family history of anesthesia complication    son gets sick  . History of colon polyps   . History of kidney stones   . Hypertension    takes Lotensin daily  . Insomnia    takes Melatonin nightly as well as Benadryl  . Joint swelling   . Kidney stones   . Macular degeneration    takes Ocuvite and Fish Oil;dry  . Nocturia   . Weakness    and heaviness;notices more in left but some in right    Past Surgical History:  Procedure Laterality Date  . BACK SURGERY  2015  . BACK SURGERY  10/2012  . CARPAL TUNNEL RELEASE Right 02/18/2015   Procedure: RIGHT WRIST CARPAL TUNNEL RELEASE;  Surgeon: Daryll Brod, MD;  Location: [place];  Service:  Orthopedics;  Laterality: Right;  . CARPAL TUNNEL RELEASE Left 04/13/2015   Procedure: LEFT CARPAL TUNNEL RELEASE;  Surgeon: Daryll Brod, MD;  Location: [place];  Service: Orthopedics;  Laterality: Left;  . COLONOSCOPY    . HERNIA REPAIR    . left kidney portion removed  2004  . Left partial nephrectomy     2004, kidney cancer  . PARATHYROIDECTOMY    . spot taken off of back  2008  . ULNAR NERVE TRANSPOSITION Right 02/18/2015   Procedure: DECOMPRESSION ULNAR NERVE RIGHT ELBOW;  Surgeon: Daryll Brod, MD;  Location: [place];  Service: Orthopedics;  Laterality: Right;    No Known Allergies  Current Outpatient Prescriptions on File Prior to Visit  Medication Sig Dispense Refill  . benazepril-hydrochlorthiazide (LOTENSIN HCT) 20-12.5 MG per tablet Take 1 tablet by mouth daily.    . beta carotene w/minerals (OCUVITE) tablet Take 1 tablet by mouth daily.    . diphenhydrAMINE (BENADRYL) 25 MG tablet Take 25 mg by mouth at bedtime.     . Melatonin 10 MG TABS Take 10 mg by mouth at bedtime.    . Omega-3 Fatty Acids (FISH OIL) 1200 MG CAPS Take 1 capsule by mouth  2 (two) times daily.    Marland Kitchen acetaminophen (TYLENOL) 500 MG tablet Take 1,000 mg by mouth 2 (two) times daily.     Marland Kitchen aspirin EC 81 MG tablet Take 81 mg by mouth daily.    Marland Kitchen docusate sodium (COLACE) 100 MG capsule Take 100 mg by mouth 2 (two) times daily as needed for mild constipation.    Marland Kitchen HYDROcodone-acetaminophen (NORCO) 5-325 MG per tablet Take 1 tablet by mouth every 6 (six) hours as needed for moderate pain. (Patient not taking: Reported on 05/23/2016) 30 tablet 0  . HYDROcodone-acetaminophen (NORCO) 5-325 MG per tablet Take 1 tablet by mouth every 6 (six) hours as needed for moderate pain. (Patient not taking: Reported on 05/23/2016) 30 tablet 0  . Multiple Vitamins-Minerals (CENTRUM PO) Take 1 tablet by mouth daily.     No current facility-administered medications on file prior to visit.          Objective:   Physical Exam Blood pressure 124/70, pulse 60, temperature 98 F (36.7 C), height 6' (1.829 m), weight 181 lb 9.6 oz (82.4 kg). Alert and oriented. Skin warm and dry. Oral mucosa is moist.   . Sclera anicteric, conjunctivae is pink. Thyroid not enlarged. No cervical lymphadenopathy. Lungs clear. Heart regular rate and rhythm.  Abdomen is soft. Bowel sounds are positive. No hepatomegaly. No abdominal masses felt. No tenderness.  No edema to lower extremities.         Assessment & Plan:  Melena. PUD needs to be ruled out. EGD. The risks and benefits such as perforation, bleeding, and infection were reviewed with the patient and is agreeable.

## 2016-07-06 ENCOUNTER — Encounter (HOSPITAL_COMMUNITY): Payer: Self-pay | Admitting: *Deleted

## 2016-07-06 ENCOUNTER — Ambulatory Visit (HOSPITAL_COMMUNITY)
Admission: RE | Admit: 2016-07-06 | Discharge: 2016-07-06 | Disposition: A | Discharge status: Home / Self Care | Payer: Medicare Other | Source: Ambulatory Visit | Attending: Internal Medicine | Admitting: Internal Medicine

## 2016-07-06 ENCOUNTER — Encounter (HOSPITAL_COMMUNITY)
Admission: RE | Disposition: A | Discharge status: Home / Self Care | Payer: Self-pay | Source: Ambulatory Visit | Attending: Internal Medicine

## 2016-07-06 DIAGNOSIS — H3530 Unspecified macular degeneration: Secondary | ICD-10-CM | POA: Diagnosis not present

## 2016-07-06 DIAGNOSIS — Z85528 Personal history of other malignant neoplasm of kidney: Secondary | ICD-10-CM | POA: Diagnosis not present

## 2016-07-06 DIAGNOSIS — K921 Melena: Secondary | ICD-10-CM | POA: Diagnosis not present

## 2016-07-06 DIAGNOSIS — Z7982 Long term (current) use of aspirin: Secondary | ICD-10-CM | POA: Insufficient documentation

## 2016-07-06 DIAGNOSIS — Z79899 Other long term (current) drug therapy: Secondary | ICD-10-CM | POA: Insufficient documentation

## 2016-07-06 DIAGNOSIS — G4700 Insomnia, unspecified: Secondary | ICD-10-CM | POA: Insufficient documentation

## 2016-07-06 DIAGNOSIS — K228 Other specified diseases of esophagus: Secondary | ICD-10-CM | POA: Diagnosis not present

## 2016-07-06 DIAGNOSIS — F17220 Nicotine dependence, chewing tobacco, uncomplicated: Secondary | ICD-10-CM | POA: Diagnosis not present

## 2016-07-06 DIAGNOSIS — Z905 Acquired absence of kidney: Secondary | ICD-10-CM | POA: Diagnosis not present

## 2016-07-06 DIAGNOSIS — I10 Essential (primary) hypertension: Secondary | ICD-10-CM | POA: Insufficient documentation

## 2016-07-06 HISTORY — PX: ESOPHAGOGASTRODUODENOSCOPY: SHX5428

## 2016-07-06 LAB — HEMOGLOBIN AND HEMATOCRIT, BLOOD
HCT: 38.3 % — ABNORMAL LOW (ref 39.0–52.0)
HEMOGLOBIN: 12.5 g/dL — AB (ref 13.0–17.0)

## 2016-07-06 SURGERY — EGD (ESOPHAGOGASTRODUODENOSCOPY)
Anesthesia: Moderate Sedation

## 2016-07-06 MED ORDER — MIDAZOLAM HCL 5 MG/5ML IJ SOLN
INTRAMUSCULAR | Status: DC | PRN
  Administered 2016-07-06 (×2): 1 mg via INTRAVENOUS

## 2016-07-06 MED ORDER — MEPERIDINE HCL 50 MG/ML IJ SOLN
INTRAMUSCULAR | Status: AC
  Filled 2016-07-06: qty 1

## 2016-07-06 MED ORDER — BUTAMBEN-TETRACAINE-BENZOCAINE 2-2-14 % EX AERO
INHALATION_SPRAY | CUTANEOUS | Status: DC | PRN
  Administered 2016-07-06: 2 via TOPICAL

## 2016-07-06 MED ORDER — MIDAZOLAM HCL 5 MG/5ML IJ SOLN
INTRAMUSCULAR | Status: AC
  Filled 2016-07-06: qty 10

## 2016-07-06 MED ORDER — SIMETHICONE 40 MG/0.6ML PO SUSP
ORAL | Status: DC | PRN
  Administered 2016-07-06: 2.5 mL

## 2016-07-06 MED ORDER — SODIUM CHLORIDE 0.9 % IV SOLN
INTRAVENOUS | Status: DC
Start: 2016-07-06 — End: 2016-07-06
  Administered 2016-07-06: 1000 mL via INTRAVENOUS

## 2016-07-06 MED ORDER — MEPERIDINE HCL 50 MG/ML IJ SOLN
INTRAMUSCULAR | Status: DC | PRN
  Administered 2016-07-06 (×2): 25 mg via INTRAVENOUS

## 2016-07-06 NOTE — H&P (Signed)
Joshua Downs is an 82 y.o. male.   Chief Complaint: Patient is here for EGD. HPI: Patient is a 48-year-old Caucasian male who experienced melena lasting 3 days about weeks ago. He was seen by Dr. Scotty Court. Aspirin and diclofenac were discontinued. Hemoglobin was 12 g. Patient was begun on omeprazole and Zantac. He was seen in our office on 05/23/2016. He has experienced intermittent nausea but denies vomiting or abdominal pain. There is no history of peptic ulcer disease. He has not lost any weight recently. Last colonoscopy was about 9 years ago revealing sigmoid diverticula and diminutive polyp which was not an adenoma.  Past Medical History:  Diagnosis Date  . Arthritis   . Arthritis   . Bradycardia    says normal HR runs about 40-44  . Cancer [facility]) 2004   left kidney  . Chronic back pain    DDD/stenosis  . Constipation    takes Dulcolax daily as needed as well as Colace  . Dysrhythmia    right BBB  . Enlarged prostate   . Family history of anesthesia complication    son gets sick  . History of colon polyps   . History of kidney stones   . Hypertension    takes Lotensin daily  . Insomnia    takes Melatonin nightly as well as Benadryl  . Joint swelling   . Kidney stones   . Macular degeneration    takes Ocuvite and Fish Oil;dry  . Nocturia   . Weakness    and heaviness;notices more in left but some in right    Past Surgical History:  Procedure Laterality Date  . BACK SURGERY  2015  . BACK SURGERY  10/2012  . CARPAL TUNNEL RELEASE Right 02/18/2015   Procedure: RIGHT WRIST CARPAL TUNNEL RELEASE;  Surgeon: Daryll Brod, MD;  Location: [place];  Service: Orthopedics;  Laterality: Right;  . CARPAL TUNNEL RELEASE Left 04/13/2015   Procedure: LEFT CARPAL TUNNEL RELEASE;  Surgeon: Daryll Brod, MD;  Location: [place];  Service: Orthopedics;  Laterality: Left;  . COLONOSCOPY    . HERNIA REPAIR    . left kidney portion removed  2004  . Left partial  nephrectomy     2004, kidney cancer  . PARATHYROIDECTOMY    . spot taken off of back  2008  . ULNAR NERVE TRANSPOSITION Right 02/18/2015   Procedure: DECOMPRESSION ULNAR NERVE RIGHT ELBOW;  Surgeon: Daryll Brod, MD;  Location: [place];  Service: Orthopedics;  Laterality: Right;    Family History  Problem Relation Age of Onset  . Dementia Mother   . Cancer Father   . Hypertension Sister   . Multiple myeloma Son    Social History:  reports that he has never smoked. His smokeless tobacco use includes Chew. He reports that he does not drink alcohol or use drugs.  Allergies: No Known Allergies  Medications Prior to Admission  Medication Sig Dispense Refill  . benazepril-hydrochlorthiazide (LOTENSIN HCT) 20-12.5 MG per tablet Take 1 tablet by mouth daily.    . beta carotene w/minerals (OCUVITE) tablet Take 1 tablet by mouth daily.    . cholecalciferol (VITAMIN D) 1000 units tablet Take 1,000 Units by mouth daily.    . diclofenac sodium (VOLTAREN) 1 % GEL Apply 1 application topically 4 (four) times daily.    . diphenhydrAMINE (BENADRYL) 25 MG tablet Take 25 mg by mouth at bedtime.     . Melatonin 10 MG TABS Take 10 mg  by mouth at bedtime.    . Omega-3 Fatty Acids (FISH OIL) 1200 MG CAPS Take 1 capsule by mouth 2 (two) times daily.    Marland Kitchen omeprazole (PRILOSEC) 20 MG capsule Take 20 mg by mouth daily.    . ranitidine (ZANTAC) 150 MG capsule Take 150 mg by mouth at bedtime.    Marland Kitchen acetaminophen (TYLENOL) 500 MG tablet Take 1,000 mg by mouth 2 (two) times daily.     Marland Kitchen aspirin EC 81 MG tablet Take 81 mg by mouth daily.    . Multiple Vitamins-Minerals (CENTRUM PO) Take 1 tablet by mouth daily.      No results found for this or any previous visit (from the past 48 hour(s)). No results found.  ROS  Blood pressure (!) 163/77, pulse (!) 44, temperature 98 F (36.7 C), temperature source Oral, resp. rate 20, height 6' (1.829 m), weight 182 lb (82.6 kg), SpO2 100 %. Physical Exam   Constitutional: He is oriented to person, place, and time. He appears well-developed and well-nourished.  HENT:  Mouth/Throat: Oropharynx is clear and moist.  Eyes: Conjunctivae are normal. No scleral icterus.  Neck: No thyromegaly present.  Cardiovascular: Normal rate, regular rhythm and normal heart sounds.   No murmur heard. Respiratory: Effort normal and breath sounds normal.  GI: Soft. He exhibits no distension and no mass. There is no tenderness.  Musculoskeletal: He exhibits no edema.  Lymphadenopathy:    He has no cervical adenopathy.  Neurological: He is alert and oriented to person, place, and time.  Skin: Skin is warm and dry.     Assessment/Plan History of melena. Diagnostic EGD.  Hildred Laser, MD 07/06/2016, 2:52 PM

## 2016-07-06 NOTE — Op Note (Signed)
[facility] Patient Name: Joshua Downs Procedure Date: 07/06/2016 2:20 PM MRN: [id number] Date of Birth: 03-06-1931 Attending MD: Hildred Laser , MD CSN: [id number] Age: 82 Admit Type: Outpatient Procedure:                Upper GI endoscopy Indications:              Melena Providers:                Hildred Laser, MD, Janeece Riggers, RN, Isabella Stalling,                            Technician Referring MD:             Zella Richer. Scotty Court, MD Medicines:                Cetacaine spray, Meperidine 50 mg IV, Midazolam 2                            mg IV Complications:            No immediate complications. Estimated Blood Loss:     Estimated blood loss: none. Procedure:                Pre-Anesthesia Assessment:                           - Prior to the procedure, a History and Physical                            was performed, and patient medications and                            allergies were reviewed. The patient's tolerance of                            previous anesthesia was also reviewed. The risks                            and benefits of the procedure and the sedation                            options and risks were discussed with the patient.                            All questions were answered, and informed consent                            was obtained. Prior Anticoagulants: The patient has                            taken no previous anticoagulant or antiplatelet                            agents. ASA Grade Assessment: II - A patient with  mild systemic disease. After reviewing the risks                            and benefits, the patient was deemed in                            satisfactory condition to undergo the procedure.                           After obtaining informed consent, the endoscope was                            passed under direct vision. Throughout the                            procedure, the patient's blood pressure, pulse, and                            oxygen saturations were monitored continuously. The                            [id number]) scope was introduced through the                            mouth, and advanced to the second part of duodenum.                            The upper GI endoscopy was accomplished without                            difficulty. The patient tolerated the procedure                            well. Scope In: 3:03:23 PM Scope Out: 3:11:01 PM Total Procedure Duration: 0 hours 7 minutes 38 seconds  Findings:      The examined esophagus was normal.      The Z-line was irregular and was found 43 cm from the incisors.      The entire examined stomach was normal.      The duodenal bulb and second portion of the duodenum were normal. Impression:               - Normal esophagus.                           - Z-line irregular at 43 cm from the incisors.                           - Normal stomach.                           - Normal duodenal bulb and second portion of the                            duodenum.                           -  No specimens collected.                           comment: Patient could've bled from peptic ulcer                            disease in the setting of NSAID use and it has                            healed by now.                           Please note patient was given earlier date for the                            procedure but he could not come on account of his                            girlfriends terminal illness.                           will check H&H today.                           He can go back on low-dose aspirin and will                            continue omeprazole. No further workup hemoglobin                            is low but he has further episodes of melena. Moderate Sedation:      Moderate (conscious) sedation was administered by the endoscopy nurse       and supervised by the endoscopist. The following parameters were        monitored: oxygen saturation, heart rate, blood pressure, CO2       capnography and response to care. Total physician intraservice time was       14 minutes. Recommendation:           - Patient has a contact number available for                            emergencies. The signs and symptoms of potential                            delayed complications were discussed with the                            patient. Return to normal activities tomorrow.                            Written discharge instructions were provided to the                            patient.                           -  Resume previous diet today.                           - Continue present medications.                           - Resume aspirin at prior dose.                           - Check hemoglobin and hematocrit. Procedure Code(s):        --- Professional ---                           [phone], Esophagogastroduodenoscopy, flexible,                            transoral; diagnostic, including collection of                            specimen(s) by brushing or washing, when performed                            (separate procedure)                           99152, Moderate sedation services provided by the                            same physician or other qualified health care                            professional performing the diagnostic or                            therapeutic service that the sedation supports,                            requiring the presence of an independent trained                            observer to assist in the monitoring of the                            patient's level of consciousness and physiological                            status; initial 15 minutes of intraservice time,                            patient age 27 years or older Diagnosis Code(s):        --- Professional ---                           K22.8, Other specified diseases of esophagus  K92.1, Melena (includes Hematochezia) CPT copyright 2016 American Medical Association. All rights reserved. The codes documented in this report are preliminary and upon coder review may  be revised to meet current compliance requirements. Hildred Laser, MD Hildred Laser, MD 07/06/2016 3:32:10 PM This report has been signed electronically. Number of Addenda: 0

## 2016-07-06 NOTE — Discharge Instructions (Signed)
Discontinue ranitidine resume other medications as before. Can go back on low-dose aspirin but do not use Advil/ Aleve or similar medications. Notify if you have melena again. Resume usual diet. Physician will call with result of H&H.  Gastrointestinal Endoscopy, Care After Refer to this sheet in the next few weeks. These instructions provide you with information on caring for yourself after your procedure. Your caregiver may also give you more specific instructions. Your treatment has been planned according to current medical practices, but problems sometimes occur. Call your caregiver if you have any problems or questions after your procedure. HOME CARE INSTRUCTIONS  If you were given medicine to help you relax (sedative), do not drive, operate machinery, or sign important documents for 24 hours.  Avoid alcohol and hot or warm beverages for the first 24 hours after the procedure.  Only take over-the-counter or prescription medicines for pain, discomfort, or fever as directed by your caregiver. You may resume taking your normal medicines unless your caregiver tells you otherwise. Ask your caregiver when you may resume taking medicines that may cause bleeding, such as aspirin, clopidogrel, or warfarin.  You may return to your normal diet and activities on the day after your procedure, or as directed by your caregiver. Walking may help to reduce any bloated feeling in your abdomen.  Drink enough fluids to keep your urine clear or pale yellow.  You may gargle with salt water if you have a sore throat. SEEK IMMEDIATE MEDICAL CARE IF:  You have severe nausea or vomiting.  You have severe abdominal pain, abdominal cramps that last longer than 6 hours, or abdominal swelling (distention).  You have severe shoulder or back pain.  You have trouble swallowing.  You have shortness of breath, your breathing is shallow, or you are breathing faster than normal.  You have a fever or a rapid  heartbeat.  You vomit blood or material that looks like coffee grounds.  You have bloody, black, or tarry stools. MAKE SURE YOU:  Understand these instructions.  Will watch your condition.  Will get help right away if you are not doing well or get worse.   This information is not intended to replace advice given to you by your health care provider. Make sure you discuss any questions you have with your health care provider.   Document Released: 05/30/2004 Document Revised: 11/06/2014 Document Reviewed: 01/16/2012 Elsevier Interactive Patient Education [employer].

## 2016-07-12 ENCOUNTER — Encounter (HOSPITAL_COMMUNITY): Payer: Self-pay | Admitting: Internal Medicine

## 2016-12-26 ENCOUNTER — Other Ambulatory Visit: Payer: Self-pay | Admitting: Neurosurgery

## 2017-01-03 ENCOUNTER — Other Ambulatory Visit: Payer: Self-pay

## 2017-01-03 ENCOUNTER — Encounter (HOSPITAL_COMMUNITY)
Admission: RE | Admit: 2017-01-03 | Discharge: 2017-01-03 | Disposition: A | Discharge status: Home / Self Care | Payer: Medicare Other | Source: Ambulatory Visit | Attending: Neurosurgery | Admitting: Neurosurgery

## 2017-01-03 ENCOUNTER — Encounter (HOSPITAL_COMMUNITY): Payer: Self-pay

## 2017-01-03 DIAGNOSIS — G4700 Insomnia, unspecified: Secondary | ICD-10-CM | POA: Diagnosis not present

## 2017-01-03 DIAGNOSIS — M21372 Foot drop, left foot: Secondary | ICD-10-CM | POA: Diagnosis not present

## 2017-01-03 DIAGNOSIS — I10 Essential (primary) hypertension: Secondary | ICD-10-CM | POA: Diagnosis not present

## 2017-01-03 DIAGNOSIS — Z981 Arthrodesis status: Secondary | ICD-10-CM | POA: Diagnosis not present

## 2017-01-03 DIAGNOSIS — M48061 Spinal stenosis, lumbar region without neurogenic claudication: Secondary | ICD-10-CM | POA: Diagnosis not present

## 2017-01-03 DIAGNOSIS — M5116 Intervertebral disc disorders with radiculopathy, lumbar region: Secondary | ICD-10-CM | POA: Diagnosis present

## 2017-01-03 DIAGNOSIS — Z905 Acquired absence of kidney: Secondary | ICD-10-CM | POA: Diagnosis not present

## 2017-01-03 DIAGNOSIS — Z85528 Personal history of other malignant neoplasm of kidney: Secondary | ICD-10-CM | POA: Diagnosis not present

## 2017-01-03 DIAGNOSIS — Z79899 Other long term (current) drug therapy: Secondary | ICD-10-CM | POA: Diagnosis not present

## 2017-01-03 DIAGNOSIS — H3530 Unspecified macular degeneration: Secondary | ICD-10-CM | POA: Diagnosis not present

## 2017-01-03 DIAGNOSIS — M4726 Other spondylosis with radiculopathy, lumbar region: Secondary | ICD-10-CM | POA: Diagnosis not present

## 2017-01-03 HISTORY — DX: Anesthesia of skin: R20.0

## 2017-01-03 LAB — CBC
HCT: 42.4 % (ref 39.0–52.0)
Hemoglobin: 13.7 g/dL (ref 13.0–17.0)
MCH: 28.4 pg (ref 26.0–34.0)
MCHC: 32.3 g/dL (ref 30.0–36.0)
MCV: 88 fL (ref 78.0–100.0)
PLATELETS: 285 10*3/uL (ref 150–400)
RBC: 4.82 MIL/uL (ref 4.22–5.81)
RDW: 13.4 % (ref 11.5–15.5)
WBC: 4.3 10*3/uL (ref 4.0–10.5)

## 2017-01-03 LAB — BASIC METABOLIC PANEL
ANION GAP: 9 (ref 5–15)
BUN: 17 mg/dL (ref 6–20)
CALCIUM: 9.1 mg/dL (ref 8.9–10.3)
CO2: 32 mmol/L (ref 22–32)
CREATININE: 1.05 mg/dL (ref 0.61–1.24)
Chloride: 100 mmol/L — ABNORMAL LOW (ref 101–111)
GFR calc non Af Amer: >60 mL/min (ref >60)
Glucose, Bld: 107 mg/dL — ABNORMAL HIGH (ref 65–99)
Potassium: 4.1 mmol/L (ref 3.5–5.1)
SODIUM: 141 mmol/L (ref 135–145)

## 2017-01-03 LAB — SURGICAL PCR SCREEN
MRSA, PCR: NEGATIVE
Staphylococcus aureus: NEGATIVE

## 2017-01-03 MED ORDER — CHLORHEXIDINE GLUCONATE CLOTH 2 % EX PADS: 6.0000 | MEDICATED_PAD | Freq: Once | CUTANEOUS | Status: DC

## 2017-01-03 NOTE — Progress Notes (Signed)
Anesthesia chart review:  Patient is a 83 year old male scheduled for L1-2 laminotomy and microdiscectomy on 01/04/17 by Dr. Sherwood Gambler.  History includes non-smoker, HTN, right BBB (and LAFB; bifascicular block), bradycardia, BPH, nephrolithiasis, parathyroidectomy, renal cancer s/p left partial nephrectomy '04, macular degeneration, hernia repair, L3-5 PLIF 11/07/12, L2-3 PLIF 12/22/13, bilateral carpal tunnel release '16.    PCP is Dr. Matthias Hughs.  Meds include benazepril-HCTZ, Benadryl, melatonin, fish oil.  BP (!) 156/81   Pulse 60   Temp 36.6 C   Resp 20   Ht 5\' 11"  (1.803 m)   Wt 179 lb 1.6 oz (81.2 kg)   SpO2 98%   BMI 24.98 kg/m   EKG 01/03/17: SB at 55 bpm, right BBB, LAFB, bifascicular block, minimal voltage criteria for LVH, may be normal variant. Negative T wave in inferior leads III and aVF. In review of EKGs from 02/16/15, 2/29/15, and 11/10/12 I think the tracings appear similar. His T wave in aVF has been intermittently negative--seen also on 12/18/13 tracing.   Preoperative labs noted.  No CV symptoms documented at his PAT visit.  Overall, I think his EKG is stable since at least 2014. He has tolerated PLIF in 2014 and 2015. Last surgery was carpal tunnel surgery in 2016. Further evaluation by his assigned anesthesiologist on the day of surgery, but if asymptomatic from a CV standpoint and otherwise no acute changes then I anticipate that he can proceed as planned.  George Hugh [facility] Short Stay Center/Anesthesiology Phone [phone] 01/03/2017 3:49 PM

## 2017-01-03 NOTE — Progress Notes (Signed)
Cardiologist  Medical Md is Dr.David [name]  Echo denies  Stress test denies  Heart cath denies  CXR and EKG denies in past yr

## 2017-01-03 NOTE — Pre-Procedure Instructions (Signed)
Joshua Downs  01/03/2017      [facility] - [place], [place] - [address] [place] [address] Phone: [phone] Fax: [phone]    Your procedure is scheduled on Thurs, Mar 8 @ 12:45 PM  Report to [facility] at 10:45 AM  Call this number if you have problems the morning of surgery:  [phone]   Remember:  Do not eat food or drink liquids after midnight.              Stop taking any Vitamins or Herbal Medications. No Goody's,BC's,Aleve,Advil,Motrin,Ibuprofen,or Fish Oil.    Do not wear jewelry.  Do not wear lotions, powders, colognes, or deoderant.  Men may shave face and neck.  Do not bring valuables to the hospital.  [facility] is not responsible for any belongings or valuables.  Contacts, dentures or bridgework may not be worn into surgery.  Leave your suitcase in the car.  After surgery it may be brought to your room.  For patients admitted to the hospital, discharge time will be determined by your treatment team.  Patients discharged the day of surgery will not be allowed to drive home.    [facility]  Before surgery, you can play an important role.  Because skin is not sterile, your skin needs to be as free of germs as possible.  You can reduce the number of germs on you skin by washing with CHG (chlorahexidine gluconate) soap before surgery.  CHG is an antiseptic cleaner which kills germs and bonds with the skin to continue killing germs even after washing.  Please DO NOT use if you have an allergy to CHG or antibacterial soaps.  If your skin becomes reddened/irritated stop using the CHG and inform your nurse when you arrive at Short Stay.  Do not shave (including legs and underarms) for at least 48 hours prior to the first CHG shower.  You may shave your face.  Please follow these instructions carefully:   1.  Shower with CHG Soap the night before surgery and the                                 morning of Surgery.  2.  If you choose to wash your hair, wash your hair first as usual with your       normal shampoo.  3.  After you shampoo, rinse your hair and body thoroughly to remove the                      Shampoo.  4.  Use CHG as you would any other liquid soap.  You can apply chg directly       to the skin and wash gently with scrungie or a clean washcloth.  5.  Apply the CHG Soap to your body ONLY FROM THE NECK DOWN.        Do not use on open wounds or open sores.  Avoid contact with your eyes,       ears, mouth and genitals (private parts).  Wash genitals (private parts)       with your normal soap.  6.  Wash thoroughly, paying special attention to the area where your surgery        will be performed.  7.  Thoroughly rinse your body with warm water from the  neck down.  8.  DO NOT shower/wash with your normal soap after using and rinsing off       the CHG Soap.  9.  Pat yourself dry with a clean towel.            10.  Wear clean pajamas.            11.  Place clean sheets on your bed the night of your first shower and do not        sleep with pets.  Day of Surgery  Do not apply any lotions/deoderants the morning of surgery.  Please wear clean clothes to the hospital/surgery center.     Please read over the following fact sheets that you were given. Pain Booklet, Coughing and Deep Breathing, MRSA Information and Surgical Site Infection Prevention

## 2017-01-04 ENCOUNTER — Ambulatory Visit (HOSPITAL_COMMUNITY): Payer: Medicare Other

## 2017-01-04 ENCOUNTER — Ambulatory Visit (HOSPITAL_COMMUNITY): Payer: Medicare Other | Admitting: Vascular Surgery

## 2017-01-04 ENCOUNTER — Encounter (HOSPITAL_COMMUNITY): Payer: Self-pay | Admitting: *Deleted

## 2017-01-04 ENCOUNTER — Observation Stay (HOSPITAL_COMMUNITY)
Admission: RE | Admit: 2017-01-04 | Discharge: 2017-01-05 | Disposition: A | Discharge status: Home / Self Care | Payer: Medicare Other | Source: Ambulatory Visit | Attending: Neurosurgery | Admitting: Neurosurgery

## 2017-01-04 ENCOUNTER — Ambulatory Visit (HOSPITAL_COMMUNITY): Payer: Medicare Other | Admitting: Anesthesiology

## 2017-01-04 ENCOUNTER — Encounter (HOSPITAL_COMMUNITY)
Admission: RE | Disposition: A | Discharge status: Home / Self Care | Payer: Self-pay | Source: Ambulatory Visit | Attending: Neurosurgery

## 2017-01-04 DIAGNOSIS — H3530 Unspecified macular degeneration: Secondary | ICD-10-CM | POA: Insufficient documentation

## 2017-01-04 DIAGNOSIS — M5116 Intervertebral disc disorders with radiculopathy, lumbar region: Principal | ICD-10-CM | POA: Insufficient documentation

## 2017-01-04 DIAGNOSIS — Z905 Acquired absence of kidney: Secondary | ICD-10-CM | POA: Insufficient documentation

## 2017-01-04 DIAGNOSIS — Z79899 Other long term (current) drug therapy: Secondary | ICD-10-CM | POA: Insufficient documentation

## 2017-01-04 DIAGNOSIS — M48061 Spinal stenosis, lumbar region without neurogenic claudication: Secondary | ICD-10-CM | POA: Insufficient documentation

## 2017-01-04 DIAGNOSIS — G4700 Insomnia, unspecified: Secondary | ICD-10-CM | POA: Insufficient documentation

## 2017-01-04 DIAGNOSIS — M4726 Other spondylosis with radiculopathy, lumbar region: Secondary | ICD-10-CM | POA: Diagnosis not present

## 2017-01-04 DIAGNOSIS — I10 Essential (primary) hypertension: Secondary | ICD-10-CM | POA: Insufficient documentation

## 2017-01-04 DIAGNOSIS — Z85528 Personal history of other malignant neoplasm of kidney: Secondary | ICD-10-CM | POA: Insufficient documentation

## 2017-01-04 DIAGNOSIS — Z981 Arthrodesis status: Secondary | ICD-10-CM | POA: Insufficient documentation

## 2017-01-04 DIAGNOSIS — M5126 Other intervertebral disc displacement, lumbar region: Secondary | ICD-10-CM | POA: Diagnosis present

## 2017-01-04 DIAGNOSIS — M21372 Foot drop, left foot: Secondary | ICD-10-CM | POA: Insufficient documentation

## 2017-01-04 DIAGNOSIS — Z419 Encounter for procedure for purposes other than remedying health state, unspecified: Secondary | ICD-10-CM

## 2017-01-04 HISTORY — PX: LUMBAR LAMINECTOMY/DECOMPRESSION MICRODISCECTOMY: SHX5026

## 2017-01-04 SURGERY — LUMBAR LAMINECTOMY/DECOMPRESSION MICRODISCECTOMY
Anesthesia: General | Laterality: Left

## 2017-01-04 MED ORDER — BISACODYL 10 MG RE SUPP: 10.0000 mg | Freq: Every day | RECTAL | Status: DC | PRN

## 2017-01-04 MED ORDER — MAGNESIUM HYDROXIDE 400 MG/5ML PO SUSP: 30.0000 mL | Freq: Every day | ORAL | Status: DC | PRN

## 2017-01-04 MED ORDER — METHYLPREDNISOLONE ACETATE 80 MG/ML IJ SUSP
INTRAMUSCULAR | Status: DC | PRN
  Administered 2017-01-04: 80 mg

## 2017-01-04 MED ORDER — PROPOFOL 10 MG/ML IV BOLUS
INTRAVENOUS | Status: DC | PRN
Start: 2017-01-04 — End: 2017-01-04
  Administered 2017-01-04: 130 mg via INTRAVENOUS

## 2017-01-04 MED ORDER — HYDROMORPHONE HCL 1 MG/ML IJ SOLN: 0.2500 mg | INTRAMUSCULAR | Status: DC | PRN

## 2017-01-04 MED ORDER — SODIUM CHLORIDE 0.9% FLUSH
3.0000 mL | Freq: Two times a day (BID) | INTRAVENOUS | Status: DC
  Administered 2017-01-04: 3 mL via INTRAVENOUS

## 2017-01-04 MED ORDER — BUPIVACAINE HCL (PF) 0.5 % IJ SOLN
INTRAMUSCULAR | Status: AC
  Filled 2017-01-04: qty 30

## 2017-01-04 MED ORDER — KETOROLAC TROMETHAMINE 15 MG/ML IJ SOLN
15.0000 mg | Freq: Once | INTRAMUSCULAR | Status: AC
  Administered 2017-01-04: 15 mg via INTRAVENOUS

## 2017-01-04 MED ORDER — KCL IN DEXTROSE-NACL 20-5-0.45 MEQ/L-%-% IV SOLN: INTRAVENOUS | Status: DC

## 2017-01-04 MED ORDER — CEFAZOLIN SODIUM-DEXTROSE 2-4 GM/100ML-% IV SOLN
2.0000 g | INTRAVENOUS | Status: AC
  Administered 2017-01-04: 2 g via INTRAVENOUS

## 2017-01-04 MED ORDER — HYDROXYZINE HCL 50 MG/ML IM SOLN: 50.0000 mg | INTRAMUSCULAR | Status: DC | PRN

## 2017-01-04 MED ORDER — MIDAZOLAM HCL 5 MG/5ML IJ SOLN
INTRAMUSCULAR | Status: DC | PRN
  Administered 2017-01-04: 1 mg via INTRAVENOUS

## 2017-01-04 MED ORDER — DEXAMETHASONE SODIUM PHOSPHATE 10 MG/ML IJ SOLN
INTRAMUSCULAR | Status: AC
  Filled 2017-01-04: qty 1

## 2017-01-04 MED ORDER — PROSIGHT PO TABS
ORAL_TABLET | Freq: Every day | ORAL | Status: DC
  Filled 2017-01-04 (×3): qty 1

## 2017-01-04 MED ORDER — ROCURONIUM BROMIDE 50 MG/5ML IV SOSY
PREFILLED_SYRINGE | INTRAVENOUS | Status: AC
  Filled 2017-01-04: qty 5

## 2017-01-04 MED ORDER — ROCURONIUM BROMIDE 100 MG/10ML IV SOLN
INTRAVENOUS | Status: DC | PRN
  Administered 2017-01-04: 50 mg via INTRAVENOUS

## 2017-01-04 MED ORDER — THROMBIN 5000 UNITS EX SOLR
CUTANEOUS | Status: DC | PRN
  Administered 2017-01-04 (×2): 5000 [IU] via TOPICAL

## 2017-01-04 MED ORDER — HYDROXYZINE HCL 25 MG PO TABS: 50.0000 mg | ORAL_TABLET | ORAL | Status: DC | PRN

## 2017-01-04 MED ORDER — BUPIVACAINE HCL (PF) 0.5 % IJ SOLN
INTRAMUSCULAR | Status: DC | PRN
  Administered 2017-01-04: 12.5 mL

## 2017-01-04 MED ORDER — SODIUM CHLORIDE 0.9 % IV SOLN: 250.0000 mL | INTRAVENOUS | Status: DC

## 2017-01-04 MED ORDER — MIDAZOLAM HCL 2 MG/2ML IJ SOLN
INTRAMUSCULAR | Status: AC
  Filled 2017-01-04: qty 2

## 2017-01-04 MED ORDER — 0.9 % SODIUM CHLORIDE (POUR BTL) OPTIME
TOPICAL | Status: DC | PRN
  Administered 2017-01-04: 1000 mL

## 2017-01-04 MED ORDER — SUGAMMADEX SODIUM 200 MG/2ML IV SOLN
INTRAVENOUS | Status: DC | PRN
  Administered 2017-01-04: 200 mg via INTRAVENOUS

## 2017-01-04 MED ORDER — ONDANSETRON HCL 4 MG/2ML IJ SOLN
INTRAMUSCULAR | Status: DC | PRN
  Administered 2017-01-04: 4 mg via INTRAVENOUS

## 2017-01-04 MED ORDER — ONDANSETRON HCL 4 MG PO TABS: 4.0000 mg | ORAL_TABLET | Freq: Four times a day (QID) | ORAL | Status: DC | PRN

## 2017-01-04 MED ORDER — FENTANYL CITRATE (PF) 100 MCG/2ML IJ SOLN
INTRAMUSCULAR | Status: DC | PRN
  Administered 2017-01-04 (×4): 50 ug via INTRAVENOUS

## 2017-01-04 MED ORDER — ACETAMINOPHEN 325 MG PO TABS: 650.0000 mg | ORAL_TABLET | ORAL | Status: DC | PRN

## 2017-01-04 MED ORDER — FLEET ENEMA 7-19 GM/118ML RE ENEM: 1.0000 | ENEMA | Freq: Once | RECTAL | Status: DC | PRN

## 2017-01-04 MED ORDER — FENTANYL CITRATE (PF) 100 MCG/2ML IJ SOLN
INTRAMUSCULAR | Status: AC
  Filled 2017-01-04: qty 4

## 2017-01-04 MED ORDER — OXYCODONE HCL 5 MG/5ML PO SOLN: 5.0000 mg | Freq: Once | ORAL | Status: DC | PRN

## 2017-01-04 MED ORDER — ALUM & MAG HYDROXIDE-SIMETH 200-200-20 MG/5ML PO SUSP: 30.0000 mL | Freq: Four times a day (QID) | ORAL | Status: DC | PRN

## 2017-01-04 MED ORDER — MORPHINE SULFATE (PF) 4 MG/ML IV SOLN: 4.0000 mg | INTRAVENOUS | Status: DC | PRN

## 2017-01-04 MED ORDER — KETOROLAC TROMETHAMINE 15 MG/ML IJ SOLN
INTRAMUSCULAR | Status: AC
  Filled 2017-01-04: qty 1

## 2017-01-04 MED ORDER — LIDOCAINE-EPINEPHRINE 2 %-1:100000 IJ SOLN
INTRAMUSCULAR | Status: AC
  Filled 2017-01-04: qty 1

## 2017-01-04 MED ORDER — PHENOL 1.4 % MT LIQD: 1.0000 | OROMUCOSAL | Status: DC | PRN

## 2017-01-04 MED ORDER — PROPOFOL 10 MG/ML IV BOLUS
INTRAVENOUS | Status: AC
  Filled 2017-01-04: qty 20

## 2017-01-04 MED ORDER — MENTHOL 3 MG MT LOZG: 1.0000 | LOZENGE | OROMUCOSAL | Status: DC | PRN

## 2017-01-04 MED ORDER — PHENYLEPHRINE 40 MCG/ML (10ML) SYRINGE FOR IV PUSH (FOR BLOOD PRESSURE SUPPORT)
PREFILLED_SYRINGE | INTRAVENOUS | Status: AC
  Filled 2017-01-04: qty 10

## 2017-01-04 MED ORDER — ACETAMINOPHEN 10 MG/ML IV SOLN
INTRAVENOUS | Status: DC | PRN
  Administered 2017-01-04: 1000 mg via INTRAVENOUS

## 2017-01-04 MED ORDER — LISINOPRIL 20 MG PO TABS
20.0000 mg | ORAL_TABLET | Freq: Every day | ORAL | Status: DC
  Administered 2017-01-04: 20 mg via ORAL
  Filled 2017-01-04: qty 1

## 2017-01-04 MED ORDER — ACETAMINOPHEN 10 MG/ML IV SOLN
INTRAVENOUS | Status: AC
  Filled 2017-01-04: qty 100

## 2017-01-04 MED ORDER — MELATONIN 5 MG PO TABS
10.0000 mg | ORAL_TABLET | Freq: Every day | ORAL | Status: DC
  Filled 2017-01-04: qty 2

## 2017-01-04 MED ORDER — OXYCODONE HCL 5 MG PO TABS: 5.0000 mg | ORAL_TABLET | Freq: Once | ORAL | Status: DC | PRN

## 2017-01-04 MED ORDER — METHYLPREDNISOLONE ACETATE 80 MG/ML IJ SUSP
INTRAMUSCULAR | Status: AC
  Filled 2017-01-04: qty 1

## 2017-01-04 MED ORDER — CEFAZOLIN SODIUM-DEXTROSE 2-4 GM/100ML-% IV SOLN
INTRAVENOUS | Status: AC
  Filled 2017-01-04: qty 100

## 2017-01-04 MED ORDER — DEXAMETHASONE SODIUM PHOSPHATE 10 MG/ML IJ SOLN
INTRAMUSCULAR | Status: DC | PRN
  Administered 2017-01-04: 10 mg via INTRAVENOUS

## 2017-01-04 MED ORDER — HYDROCHLOROTHIAZIDE 12.5 MG PO CAPS
12.5000 mg | ORAL_CAPSULE | Freq: Every day | ORAL | Status: DC
  Administered 2017-01-04: 12.5 mg via ORAL
  Filled 2017-01-04: qty 1

## 2017-01-04 MED ORDER — PHENYLEPHRINE HCL 10 MG/ML IJ SOLN
INTRAMUSCULAR | Status: DC | PRN
  Administered 2017-01-04 (×2): 80 ug via INTRAVENOUS
  Administered 2017-01-04: 40 ug via INTRAVENOUS
  Administered 2017-01-04 (×2): 80 ug via INTRAVENOUS

## 2017-01-04 MED ORDER — ONDANSETRON HCL 4 MG/2ML IJ SOLN: 4.0000 mg | Freq: Four times a day (QID) | INTRAMUSCULAR | Status: DC | PRN

## 2017-01-04 MED ORDER — BENAZEPRIL-HYDROCHLOROTHIAZIDE 20-12.5 MG PO TABS: 1.0000 | ORAL_TABLET | Freq: Every day | ORAL | Status: DC

## 2017-01-04 MED ORDER — SODIUM CHLORIDE 0.9 % IR SOLN
Status: DC | PRN
  Administered 2017-01-04: 500 mL

## 2017-01-04 MED ORDER — ONDANSETRON HCL 4 MG/2ML IJ SOLN
INTRAMUSCULAR | Status: AC
  Filled 2017-01-04: qty 2

## 2017-01-04 MED ORDER — ACETAMINOPHEN 650 MG RE SUPP: 650.0000 mg | RECTAL | Status: DC | PRN

## 2017-01-04 MED ORDER — LIDOCAINE-EPINEPHRINE 1 %-1:100000 IJ SOLN
INTRAMUSCULAR | Status: DC | PRN
  Administered 2017-01-04: 12.5 mL

## 2017-01-04 MED ORDER — LIDOCAINE 2% (20 MG/ML) 5 ML SYRINGE
INTRAMUSCULAR | Status: AC
  Filled 2017-01-04: qty 5

## 2017-01-04 MED ORDER — HYDROCODONE-ACETAMINOPHEN 5-325 MG PO TABS
1.0000 | ORAL_TABLET | ORAL | Status: DC | PRN
  Administered 2017-01-04 – 2017-01-05 (×3): 2 via ORAL
  Filled 2017-01-04 (×3): qty 2

## 2017-01-04 MED ORDER — PHENYLEPHRINE HCL 10 MG/ML IJ SOLN
INTRAMUSCULAR | Status: DC | PRN
  Administered 2017-01-04: 25 ug/min via INTRAVENOUS

## 2017-01-04 MED ORDER — FENTANYL CITRATE (PF) 100 MCG/2ML IJ SOLN
INTRAMUSCULAR | Status: AC
  Filled 2017-01-04: qty 2

## 2017-01-04 MED ORDER — CYCLOBENZAPRINE HCL 5 MG PO TABS
5.0000 mg | ORAL_TABLET | Freq: Three times a day (TID) | ORAL | Status: DC | PRN
  Administered 2017-01-04: 5 mg via ORAL
  Filled 2017-01-04: qty 1

## 2017-01-04 MED ORDER — LIDOCAINE HCL (CARDIAC) 20 MG/ML IV SOLN
INTRAVENOUS | Status: DC | PRN
  Administered 2017-01-04: 100 mg via INTRAVENOUS

## 2017-01-04 MED ORDER — KETOROLAC TROMETHAMINE 15 MG/ML IJ SOLN
15.0000 mg | Freq: Three times a day (TID) | INTRAMUSCULAR | Status: DC
  Administered 2017-01-04 – 2017-01-05 (×2): 15 mg via INTRAVENOUS
  Filled 2017-01-04 (×2): qty 1

## 2017-01-04 MED ORDER — FENTANYL CITRATE (PF) 100 MCG/2ML IJ SOLN
INTRAMUSCULAR | Status: DC | PRN
  Administered 2017-01-04: 100 ug via INTRAVENOUS

## 2017-01-04 MED ORDER — SODIUM CHLORIDE 0.9% FLUSH
3.0000 mL | INTRAVENOUS | Status: DC | PRN
Start: 2017-01-04 — End: 2017-01-05

## 2017-01-04 MED ORDER — PHENYLEPHRINE HCL 10 MG/ML IJ SOLN
INTRAMUSCULAR | Status: AC
  Filled 2017-01-04: qty 1

## 2017-01-04 MED ORDER — HEMOSTATIC AGENTS (NO CHARGE) OPTIME
TOPICAL | Status: DC | PRN
  Administered 2017-01-04: 1 via TOPICAL

## 2017-01-04 MED ORDER — THROMBIN 5000 UNITS EX SOLR
CUTANEOUS | Status: AC
  Filled 2017-01-04: qty 15000

## 2017-01-04 MED ORDER — LACTATED RINGERS IV SOLN
INTRAVENOUS | Status: DC
  Administered 2017-01-04 (×3): via INTRAVENOUS

## 2017-01-04 MED ORDER — MELATONIN 3 MG PO TABS
9.0000 mg | ORAL_TABLET | Freq: Every day | ORAL | Status: DC
  Administered 2017-01-04: 9 mg via ORAL
  Filled 2017-01-04 (×2): qty 3

## 2017-01-04 MED ORDER — SUGAMMADEX SODIUM 200 MG/2ML IV SOLN
INTRAVENOUS | Status: AC
  Filled 2017-01-04: qty 2

## 2017-01-04 SURGICAL SUPPLY — 63 items
ADH SKN CLS APL DERMABOND .7 (GAUZE/BANDAGES/DRESSINGS) ×2
APL SKNCLS STERI-STRIP NONHPOA (GAUZE/BANDAGES/DRESSINGS)
BAG DECANTER FOR FLEXI CONT (MISCELLANEOUS) ×2 IMPLANT
BENZOIN TINCTURE PRP APPL 2/3 (GAUZE/BANDAGES/DRESSINGS) IMPLANT
BLADE CLIPPER SURG (BLADE) IMPLANT
BUR ACORN 6.0 ACORN (BURR) IMPLANT
BUR ACRON 5.0MM COATED (BURR) ×2 IMPLANT
BUR MATCHSTICK NEURO 3.0 LAGG (BURR) ×2 IMPLANT
CANISTER SUCT 3000ML PPV (MISCELLANEOUS) ×2 IMPLANT
CARTRIDGE OIL MAESTRO DRILL (MISCELLANEOUS) ×1 IMPLANT
DERMABOND ADVANCED (GAUZE/BANDAGES/DRESSINGS) ×2
DERMABOND ADVANCED .7 DNX12 (GAUZE/BANDAGES/DRESSINGS) ×2 IMPLANT
DIFFUSER DRILL AIR PNEUMATIC (MISCELLANEOUS) ×2 IMPLANT
DRAPE LAPAROTOMY 100X72X124 (DRAPES) ×2 IMPLANT
DRAPE MICROSCOPE LEICA (MISCELLANEOUS) ×2 IMPLANT
DRAPE POUCH INSTRU U-SHP 10X18 (DRAPES) ×2 IMPLANT
ELECT REM PT RETURN 9FT ADLT (ELECTROSURGICAL) ×2
ELECTRODE REM PT RTRN 9FT ADLT (ELECTROSURGICAL) ×1 IMPLANT
GAUZE SPONGE 4X4 12PLY STRL (GAUZE/BANDAGES/DRESSINGS) ×1 IMPLANT
GAUZE SPONGE 4X4 16PLY XRAY LF (GAUZE/BANDAGES/DRESSINGS) IMPLANT
GLOVE BIO SURGEON STRL SZ 6.5 (GLOVE) ×2 IMPLANT
GLOVE BIO SURGEON STRL SZ8 (GLOVE) ×2 IMPLANT
GLOVE BIOGEL PI IND STRL 8 (GLOVE) ×1 IMPLANT
GLOVE BIOGEL PI INDICATOR 8 (GLOVE) ×1
GLOVE ECLIPSE 7.5 STRL STRAW (GLOVE) ×2 IMPLANT
GLOVE EXAM NITRILE LRG STRL (GLOVE) IMPLANT
GLOVE EXAM NITRILE XL STR (GLOVE) IMPLANT
GLOVE EXAM NITRILE XS STR PU (GLOVE) IMPLANT
GLOVE INDICATOR 7.0 STRL GRN (GLOVE) ×2 IMPLANT
GLOVE INDICATOR 7.5 STRL GRN (GLOVE) ×1 IMPLANT
GLOVE SURG SS PI 7.0 STRL IVOR (GLOVE) ×6 IMPLANT
GOWN STRL REUS W/ TWL LRG LVL3 (GOWN DISPOSABLE) ×1 IMPLANT
GOWN STRL REUS W/ TWL XL LVL3 (GOWN DISPOSABLE) ×2 IMPLANT
GOWN STRL REUS W/TWL 2XL LVL3 (GOWN DISPOSABLE) IMPLANT
GOWN STRL REUS W/TWL LRG LVL3 (GOWN DISPOSABLE) ×2
GOWN STRL REUS W/TWL XL LVL3 (GOWN DISPOSABLE) ×4
KIT BASIN OR (CUSTOM PROCEDURE TRAY) ×2 IMPLANT
KIT ROOM TURNOVER OR (KITS) ×2 IMPLANT
NDL HYPO 18GX1.5 BLUNT FILL (NEEDLE) IMPLANT
NDL SPNL 18GX3.5 QUINCKE PK (NEEDLE) ×1 IMPLANT
NEEDLE HYPO 18GX1.5 BLUNT FILL (NEEDLE) IMPLANT
NEEDLE SPNL 18GX3.5 QUINCKE PK (NEEDLE) ×2 IMPLANT
NEEDLE SPNL 22GX3.5 QUINCKE BK (NEEDLE) ×2 IMPLANT
NS IRRIG 1000ML POUR BTL (IV SOLUTION) ×2 IMPLANT
OIL CARTRIDGE MAESTRO DRILL (MISCELLANEOUS) ×2
PACK LAMINECTOMY NEURO (CUSTOM PROCEDURE TRAY) ×2 IMPLANT
PAD ARMBOARD 7.5X6 YLW CONV (MISCELLANEOUS) ×6 IMPLANT
PATTIES SURGICAL .5 X1 (DISPOSABLE) ×2 IMPLANT
RUBBERBAND STERILE (MISCELLANEOUS) ×4 IMPLANT
SPONGE GAUZE 4X4 12PLY STER LF (GAUZE/BANDAGES/DRESSINGS) ×2 IMPLANT
SPONGE LAP 4X18 X RAY DECT (DISPOSABLE) IMPLANT
SPONGE SURGIFOAM ABS GEL SZ50 (HEMOSTASIS) ×2 IMPLANT
STRIP CLOSURE SKIN 1/2X4 (GAUZE/BANDAGES/DRESSINGS) IMPLANT
SUT PROLENE 6 0 BV (SUTURE) IMPLANT
SUT VIC AB 1 CT1 18XBRD ANBCTR (SUTURE) ×2 IMPLANT
SUT VIC AB 1 CT1 8-18 (SUTURE) ×4
SUT VIC AB 2-0 CP2 18 (SUTURE) ×4 IMPLANT
SUT VIC AB 3-0 SH 8-18 (SUTURE) ×2 IMPLANT
SYR 5ML LL (SYRINGE) IMPLANT
TAPE CLOTH SURG 4X10 WHT LF (GAUZE/BANDAGES/DRESSINGS) ×2 IMPLANT
TOWEL GREEN STERILE (TOWEL DISPOSABLE) ×2 IMPLANT
TOWEL GREEN STERILE FF (TOWEL DISPOSABLE) ×2 IMPLANT
WATER STERILE IRR 1000ML POUR (IV SOLUTION) ×2 IMPLANT

## 2017-01-04 NOTE — H&P (Signed)
Subjective: Patient is a 83 y.o. right-handed white male who is admitted for treatment of left lumbar radiculopathy secondary to a left L1-2 lumbar disc herniation. 3/2 weeks ago patient developed pain in the medial left thigh with some discomfort as well in the anterior left thigh. He's had limited ability to flex the left hip and to lift the left thigh against gravity. On exam was found to have significant weakness of the left iliopsoas 3-4 minus/5. Patient has chronic left foot drop in the left dorsiflexor and EHL are 1-2/5. MRI scan was obtained and revealed a left L1-2 lumbar disc herniation with a free fragment extends caudally behind the body of L2. There is moderate to marked multifactorial lumbar stenosis at the L1-2 level. His history is notable for having undergone previous L3-L5 decompression and arthrodesis incessantly a L2-3 lumbar decompression and arthrodesis. Patient admitted now for a left L1-2 lumbar laminotomy and micro-discectomy.    Patient Active Problem List   Diagnosis Date Noted  . Essential hypertension 05/23/2016  . Arthritis 05/23/2016  . Melena 05/23/2016  . Abnormality of gait 03/25/2014  . Lumbar stenosis with neurogenic claudication 12/22/2013   Past Medical History:  Diagnosis Date  . Arthritis   . Arthritis   . Bradycardia    says normal HR runs about 40-44  . Cancer [facility]) 2004   left kidney  . Chronic back pain    DDD/stenosis  . Constipation    takes Dulcolax daily as needed as well as Colace  . Dysrhythmia    right BBB  . Enlarged prostate   . History of colon polyps   . History of kidney stones   . Hypertension    takes Lotensin daily  . Insomnia    takes Melatonin nightly as well as Benadryl  . Joint swelling   . Macular degeneration    takes Ocuvite and Fish Oil;dry  . Nocturia   . Numbness    in hands and feet  . Weakness    and heaviness;notices more in left but some in right    Past Surgical History:  Procedure Laterality Date  .  BACK SURGERY  2015  . BACK SURGERY  10/2012  . CARPAL TUNNEL RELEASE Right 02/18/2015   Procedure: RIGHT WRIST CARPAL TUNNEL RELEASE;  Surgeon: Daryll Brod, MD;  Location: [facility];  Service: Orthopedics;  Laterality: Right;  . CARPAL TUNNEL RELEASE Left 04/13/2015   Procedure: LEFT CARPAL TUNNEL RELEASE;  Surgeon: Daryll Brod, MD;  Location: [facility];  Service: Orthopedics;  Laterality: Left;  . COLONOSCOPY    . ESOPHAGOGASTRODUODENOSCOPY N/A 07/06/2016   Procedure: ESOPHAGOGASTRODUODENOSCOPY (EGD);  Surgeon: Rogene Houston, MD;  [facility];  Service: Endoscopy;  Laterality: N/A;  3:05  . HERNIA REPAIR    . left kidney portion removed  2004  . Left partial nephrectomy     2004, kidney cancer  . PARATHYROIDECTOMY    . spot taken off of back  2008  . ULNAR NERVE TRANSPOSITION Right 02/18/2015   Procedure: DECOMPRESSION ULNAR NERVE RIGHT ELBOW;  Surgeon: Daryll Brod, MD;  Location: [facility];  Service: Orthopedics;  Laterality: Right;    No prescriptions prior to admission.   Allergies  Allergen Reactions  . No Known Allergies     Social History  Substance Use Topics  . Smoking status: Never Smoker  . Smokeless tobacco: Current User    Types: Chew     Comment: quit smoking in 1985  .  Alcohol use No    Family History  Problem Relation Age of Onset  . Dementia Mother   . Cancer Father   . Hypertension Sister   . Multiple myeloma Son      Review of Systems A comprehensive review of systems was negative.  Objective: Vital signs in last 24 hours: Temp:  [97.9 F (36.6 C)] 97.9 F (36.6 C) (03/07 1340) Pulse Rate:  [60] 60 (03/07 1340) Resp:  [20] 20 (03/07 1340) BP: (156)/(81) 156/81 (03/07 1340) SpO2:  [98 %] 98 % (03/07 1340) Weight:  [81.2 kg (179 lb 1.6 oz)] 81.2 kg (179 lb 1.6 oz) (03/07 1340)  EXAM: Patient is a well-developed well-nourished white male in no acute distress.  Lungs are clear to auscultation ,  the patient has symmetrical respiratory excursion. Heart has a regular rate and rhythm normal S1 and S2 no murmur.   Abdomen is soft nontender nondistended bowel sounds are present. Extremity examination shows no clubbing cyanosis or edema. Neurologic examination shows left iliopsoas is 3-4 minus/5, left quadriceps is 5/5, left was flexor and EHL are 1-2/5, left plantar flexors 5/5. Right iliopsoas, quadriceps, dorsi flexor, EHL, and plantar flexor are 5/5. Sensation is intact to pinprick in the lower extremities. Reflex examination shows the left quadriceps is 1, the right quadriceps is 2. Gastrocnemius are absent bilaterally. Toes are downgoing bilaterally. Gait and stance favor the left lower extremity.  Data Review:CBC    Component Value Date/Time   WBC 4.3 01/03/2017 1415   RBC 4.82 01/03/2017 1415   HGB 13.7 01/03/2017 1415   HCT 42.4 01/03/2017 1415   PLT 285 01/03/2017 1415   MCV 88.0 01/03/2017 1415   MCH 28.4 01/03/2017 1415   MCHC 32.3 01/03/2017 1415   RDW 13.4 01/03/2017 1415                          BMET    Component Value Date/Time   NA 141 01/03/2017 1415   K 4.1 01/03/2017 1415   CL 100 (L) 01/03/2017 1415   CO2 32 01/03/2017 1415   GLUCOSE 107 (H) 01/03/2017 1415   BUN 17 01/03/2017 1415   CREATININE 1.05 01/03/2017 1415   CALCIUM 9.1 01/03/2017 1415   GFRNONAA >60 01/03/2017 1415   GFRAA >60 01/03/2017 1415     Assessment/Plan: Patient with acute left lumbar radiculopathy with significant left iliopsoas weakness secondary to left L1-2 lumbar disc herniation, with a free fragment disc relation risks and caudally behind the body of L2 was now for a left L1-2 lumbar laminotomy and microdiscectomy.  I've discussed with the patient the nature of his condition, the nature the surgical procedure, the typical length of surgery, hospital stay, and overall recuperation. We discussed limitations postoperatively. I discussed risks of surgery including risks of infection,  bleeding, possibly need for transfusion, the risk of nerve root dysfunction with pain, weakness, numbness, or paresthesias, or risk of dural tear and CSF leakage and possible need for further surgery, the risk of recurrent disc herniation and the possible need for further surgery, and the risk of anesthetic complications including myocardial infarction, stroke, pneumonia, and death. Understanding all this the patient does wish to proceed with surgery and is admitted for such.    Hosie Spangle, MD 01/04/2017 7:18 AM

## 2017-01-04 NOTE — Anesthesia Preprocedure Evaluation (Addendum)
Anesthesia Evaluation  Patient identified by MRN, date of birth, ID band Patient awake    Reviewed: Allergy & Precautions, NPO status , Patient's Chart, lab work & pertinent test results  History of Anesthesia Complications Negative for: history of anesthetic complications  Airway Mallampati: I  TM Distance: >3 FB Neck ROM: Full    Dental  (+) Edentulous Upper, Edentulous Lower   Pulmonary neg pulmonary ROS,    Pulmonary exam normal breath sounds clear to auscultation       Cardiovascular hypertension, Pt. on medications Normal cardiovascular exam Rhythm:regular Rate:Normal     Neuro/Psych negative neurological ROS  negative psych ROS   GI/Hepatic negative GI ROS, Neg liver ROS,   Endo/Other  negative endocrine ROS  Renal/GU      Musculoskeletal  (+) Arthritis ,   Abdominal   Peds  Hematology   Anesthesia Other Findings   Reproductive/Obstetrics                            Anesthesia Physical Anesthesia Plan  ASA: II  Anesthesia Plan: General   Post-op Pain Management:    Induction: Intravenous  Airway Management Planned: Oral ETT  Additional Equipment: None  Intra-op Plan:   Post-operative Plan: Extubation in OR  Informed Consent: I have reviewed the patients History and Physical, chart, labs and discussed the procedure including the risks, benefits and alternatives for the proposed anesthesia with the patient or authorized representative who has indicated his/her understanding and acceptance.   Dental advisory given  Plan Discussed with: CRNA and Surgeon  Anesthesia Plan Comments:         Anesthesia Quick Evaluation

## 2017-01-04 NOTE — Transfer of Care (Signed)
Immediate Anesthesia Transfer of Care Note  Patient: Joshua Downs  Procedure(s) Performed: Procedure(s): LUMBAR ONE - LUMBAR TWO  LAMINOTOMY AND MICRODISCECTOMY (Left)  Patient Location: PACU  Anesthesia Type:General  Level of Consciousness: awake, alert , oriented and patient cooperative  Airway & Oxygen Therapy: Patient Spontanous Breathing and Patient connected to nasal cannula oxygen  Post-op Assessment: Report given to RN, Post -op Vital signs reviewed and stable and Patient moving all extremities  Post vital signs: Reviewed and stable  Last Vitals:  Vitals:   01/04/17 1055 01/04/17 1607  BP: (!) 179/73 (!) 174/94  Pulse: (!) 53 97  Resp: 20 20  Temp: 36.8 C 36.7 C    Last Pain:  Vitals:   01/04/17 1055  TempSrc: Oral      Patients Stated Pain Goal: 4 (85/90/93 1121)  Complications: No apparent anesthesia complications

## 2017-01-04 NOTE — Op Note (Addendum)
01/04/2017  3:44 PM  PATIENT:  Joshua Downs  83 y.o. male  PRE-OPERATIVE DIAGNOSIS:  Left L1-2 HERNIATED NUCLEUS PULPOSUS, LUMBAR; lumbar spondylosis, lumbar degenerative disease, left lumbar radiculopathy with left iliopsoas weakness  POST-OPERATIVE DIAGNOSIS:  Left L1-2 HERNIATED NUCLEUS PULPOSUS, LUMBAR; lumbar spondylosis, lumbar degenerative disease, left lumbar radiculopathy with left iliopsoas weakness  PROCEDURE:  Procedure(s):  Left LUMBAR ONE - LUMBAR TWO  LAMINOTOMY AND MICRODISCECTOMY, with microdissection, microsurgical technique, and the operating microscope  SURGEON:  Surgeon(s): Jovita Gamma, MD Kary Kos, MD  ASSISTANTS: Kary Kos, M.D.  ANESTHESIA:   general  EBL:  Total I/O In: 1000 [I.V.:1000] Out: 150 [Blood:150]  BLOOD ADMINISTERED:none  COUNT: Correct per nursing staff  DICTATION: Patient was brought to the operating room and placed under general endotracheal anesthesia. Patient was turned to prone position the lumbar region was prepped with Betadine soap and solution and draped in a sterile fashion. The midline was infiltrated with local anesthetic with epinephrine. A localizing x-ray was taken and the L1-2 level was identified. Midline incision was made, through the existing midline incision, extended rostrally, at the L1-2 level, and was carried down through the subcutaneous tissue to the lumbar fascia. The lumbar fascia was incised on the left side and the paraspinal muscles were dissected from the spinous processes and lamina in a subperiosteal fashion. Additional x-rays were taken and the L1-2 intralaminar space was identified. The operating microscope was draped and brought into the field provided additional magnification, illumination, and visualization. Laminotomy was performed using the high-speed drill and Kerrison punches. The ligamentum flavum was carefully resected. The underlying thecal sac and nerve root were identified. The disc herniation was  identified and the thecal sac and nerve root gently retracted medially. The free fragment disc herniation was mobilized from the surrounding epidural tissues, and removed in a piecemeal fashion, with good decompression of the thecal sac and exiting left L1 nerve root. All loose fragments of herniated disc material were removed. Once the discectomy was completed and good decompression of the thecal sac and nerve had been achieved hemostasis was established with the use of bipolar cautery and Gelfoam with thrombin. Hemostasis was confirmed. We then instilled 2 cc of fentanyl and 80 mg of Depo-Medrol into the epidural space. Deep fascia was closed with interrupted undyed 1 Vicryl sutures. Scarpa's fascia was closed with interrupted undyed 1 Vicryl sutures in the subcutaneous and subcuticular layer were closed with interrupted inverted 2-0 undyed Vicryl sutures. The skin edges were approximated with Dermabond. A dressing of sterile gauze and Hypafix was applied. Following surgery the patient was turned back to a supine position to be reversed from the anesthetic extubated and transferred to the recovery room for further care.   PLAN OF CARE: Admit for overnight observation  PATIENT DISPOSITION:  PACU - hemodynamically stable.   Delay start of Pharmacological VTE agent (>24hrs) due to surgical blood loss or risk of bleeding:  yes

## 2017-01-04 NOTE — Progress Notes (Signed)
Vitals:   01/04/17 1621 01/04/17 1635 01/04/17 1650 01/04/17 1700  BP: (!) 163/88 (!) 159/94 (!) 149/95 (!) 168/82  Pulse: 81 73 62 (!) 48  Resp: 12 17 12 16   Temp:   98.3 F (36.8 C) 98 F (36.7 C)  TempSrc:    Oral  SpO2: 98% 98% 96% 100%  Weight:        CBC  Recent Labs  01/03/17 1415  WBC 4.3  HGB 13.7  HCT 42.4  PLT 285   BMET  Recent Labs  01/03/17 1415  NA 141  K 4.1  CL 100*  CO2 32  GLUCOSE 107*  BUN 17  CREATININE 1.05  CALCIUM 9.1    Patient resting in bed, good relief of left lumbar radicular pain but some incisional back discomfort. Dressing clean and dry. Patient has voided. Has only ambulated in the room, encouraged to ambulate in the halls with the staff.  Plan: Doing well following surgery. Patient to work on progressive ambulation.  Hosie Spangle, MD 01/04/2017, 5:44 PM

## 2017-01-04 NOTE — Anesthesia Procedure Notes (Signed)
Procedure Name: Intubation Date/Time: 01/04/2017 1:05 PM Performed by: Greggory Stallion, Philisha Weinel L Pre-anesthesia Checklist: Patient identified, Emergency Drugs available, Suction available and Patient being monitored Patient Re-evaluated:Patient Re-evaluated prior to inductionOxygen Delivery Method: Circle System Utilized Preoxygenation: Pre-oxygenation with 100% oxygen Intubation Type: IV induction Ventilation: Mask ventilation without difficulty Laryngoscope Size: Mac and 3 Grade View: Grade I Tube type: Oral Tube size: 8.0 mm Number of attempts: 1 Airway Equipment and Method: Stylet and Oral airway Placement Confirmation: ETT inserted through vocal cords under direct vision,  positive ETCO2 and breath sounds checked- equal and bilateral Secured at: 21 cm Tube secured with: Tape Dental Injury: Teeth and Oropharynx as per pre-operative assessment

## 2017-01-05 ENCOUNTER — Encounter (HOSPITAL_COMMUNITY): Payer: Self-pay | Admitting: Neurosurgery

## 2017-01-05 DIAGNOSIS — M5116 Intervertebral disc disorders with radiculopathy, lumbar region: Secondary | ICD-10-CM | POA: Diagnosis not present

## 2017-01-05 MED ORDER — HYDROCODONE-ACETAMINOPHEN 5-325 MG PO TABS: 1.0000 | ORAL_TABLET | ORAL | 0 refills | Status: AC | PRN

## 2017-01-05 NOTE — Progress Notes (Signed)
Pt doing well. Pt and son given D/C instructions with Rx, verbal understanding was provided. Pt's incision is clean and dry with no sign of infection. Pt's IV was removed prior to D/C. Pt D/C'd home via wheelchair @ 0840 per MD order. Pt is stable @ D/C and has no other needs at this time. Holli Humbles, RN

## 2017-01-05 NOTE — Discharge Summary (Signed)
Physician Discharge Summary  Patient ID: Joshua Downs MRN: [id number] DOB/AGE: 02-22-1931 83 y.o.  Admit date: 01/04/2017 Discharge date: 01/05/2017  Admission Diagnoses:  L1-2 HERNIATED NUCLEUS PULPOSUS, LUMBAR; lumbar spondylosis, lumbar degenerative disease, left lumbar radiculopathy with left iliopsoas weakness  Discharge Diagnoses:  L1-2 HERNIATED NUCLEUS PULPOSUS, LUMBAR; lumbar spondylosis, lumbar degenerative disease, left lumbar radiculopathy with left iliopsoas weakness Active Problems:   HNP (herniated nucleus pulposus), lumbar   Discharged Condition: good  Hospital Course: Patient admitted, underwent an left L1-2 lumbar laminotomy and microdiscectomy. Postoperatively he is done well. He is up and ambulating actively. He is voiding well. His incision is healing nicely; there is no erythema, swelling, or drainage. He is being discharged home with instructions regarding wound care and activities. He is to return for follow-up with me in the office in 3 weeks.  Discharge Exam: Blood pressure 114/69, pulse (!) 50, temperature 98.1 F (36.7 C), temperature source Oral, resp. rate 18, weight 81.2 kg (179 lb), SpO2 98 %.  Disposition: 01-Home or Self Care  Discharge Instructions    Discharge wound care:    Complete by:  As directed    Leave the wound open to air. Shower daily with the wound uncovered. Water and soapy water should run over the incision area. Do not wash directly on the incision for 2 weeks. Remove the glue after 2 weeks.   Driving Restrictions    Complete by:  As directed    No driving for 2 weeks. May ride in the car locally now. May begin to drive locally in 2 weeks.   Other Restrictions    Complete by:  As directed    Walk gradually increasing distances out in the fresh air at least twice a day. Walking additional 6 times inside the house, gradually increasing distances, daily. No bending, lifting, or twisting. Perform activities between shoulder and waist height  (that is at counter height when standing or table height when sitting).     Allergies as of 01/05/2017      Reactions   No Known Allergies       Medication List    TAKE these medications   benazepril-hydrochlorthiazide 20-12.5 MG tablet Commonly known as:  LOTENSIN HCT Take 1 tablet by mouth daily.   beta carotene w/minerals tablet Take 1 tablet by mouth daily.   diclofenac sodium 1 % Gel Commonly known as:  VOLTAREN Apply 1 application topically daily. Left shoulder   diphenhydrAMINE 25 MG tablet Commonly known as:  BENADRYL Take 50 mg by mouth at bedtime.   Fish Oil 1200 MG Caps Take 2,400 mg by mouth 2 (two) times daily.   HYDROcodone-acetaminophen 5-325 MG tablet Commonly known as:  NORCO/VICODIN Take 1-2 tablets by mouth every 4 (four) hours as needed for moderate pain.   Melatonin 10 MG Tabs Take 10 mg by mouth at bedtime.        SignedHosie Spangle 01/05/2017, 7:59 AM

## 2017-01-05 NOTE — Discharge Instructions (Signed)

## 2017-01-16 NOTE — Anesthesia Postprocedure Evaluation (Signed)
Anesthesia Post Note  Patient: Joshua Downs  Procedure(s) Performed: Procedure(s) (LRB): LUMBAR ONE - LUMBAR TWO  LAMINOTOMY AND MICRODISCECTOMY (Left)  Patient location during evaluation: PACU Anesthesia Type: General Level of consciousness: awake and alert Pain management: pain level controlled Vital Signs Assessment: post-procedure vital signs reviewed and stable Respiratory status: spontaneous breathing, nonlabored ventilation, respiratory function stable and patient connected to nasal cannula oxygen Cardiovascular status: blood pressure returned to baseline and stable Postop Assessment: no signs of nausea or vomiting Anesthetic complications: no       Last Vitals:  Vitals:   01/04/17 2329 01/05/17 0456  BP: 122/66 114/69  Pulse: (!) 52 (!) 50  Resp: 18 18  Temp: 36.9 C 36.7 C    Last Pain:  Vitals:   01/05/17 0520  TempSrc:   PainSc: 0-No pain                 [name]

## 2017-01-24 ENCOUNTER — Encounter (HOSPITAL_COMMUNITY): Payer: Self-pay | Admitting: *Deleted

## 2017-01-24 ENCOUNTER — Emergency Department (HOSPITAL_COMMUNITY): Payer: Medicare Other

## 2017-01-24 ENCOUNTER — Encounter (INDEPENDENT_AMBULATORY_CARE_PROVIDER_SITE_OTHER): Payer: Self-pay | Admitting: Internal Medicine

## 2017-01-24 ENCOUNTER — Inpatient Hospital Stay (HOSPITAL_COMMUNITY)
Admission: EM | Admit: 2017-01-24 | Discharge: 2017-02-27 | DRG: 329 | Disposition: E | Payer: Medicare Other | Attending: General Surgery | Admitting: General Surgery

## 2017-01-24 ENCOUNTER — Inpatient Hospital Stay (HOSPITAL_COMMUNITY): Payer: Medicare Other

## 2017-01-24 ENCOUNTER — Ambulatory Visit (INDEPENDENT_AMBULATORY_CARE_PROVIDER_SITE_OTHER): Payer: Medicare Other | Admitting: Internal Medicine

## 2017-01-24 VITALS — BP 112/60 | HR 60 | Temp 98.0°F | Ht 72.0 in | Wt 168.2 lb

## 2017-01-24 DIAGNOSIS — K651 Peritoneal abscess: Secondary | ICD-10-CM | POA: Diagnosis present

## 2017-01-24 DIAGNOSIS — H3530 Unspecified macular degeneration: Secondary | ICD-10-CM | POA: Diagnosis present

## 2017-01-24 DIAGNOSIS — Z95828 Presence of other vascular implants and grafts: Secondary | ICD-10-CM

## 2017-01-24 DIAGNOSIS — K750 Abscess of liver: Secondary | ICD-10-CM | POA: Diagnosis present

## 2017-01-24 DIAGNOSIS — K5651 Intestinal adhesions [bands], with partial obstruction: Principal | ICD-10-CM | POA: Diagnosis present

## 2017-01-24 DIAGNOSIS — E860 Dehydration: Secondary | ICD-10-CM | POA: Diagnosis present

## 2017-01-24 DIAGNOSIS — N189 Chronic kidney disease, unspecified: Secondary | ICD-10-CM

## 2017-01-24 DIAGNOSIS — G8929 Other chronic pain: Secondary | ICD-10-CM | POA: Diagnosis present

## 2017-01-24 DIAGNOSIS — R188 Other ascites: Secondary | ICD-10-CM | POA: Diagnosis not present

## 2017-01-24 DIAGNOSIS — Z79899 Other long term (current) drug therapy: Secondary | ICD-10-CM

## 2017-01-24 DIAGNOSIS — R140 Abdominal distension (gaseous): Secondary | ICD-10-CM | POA: Diagnosis not present

## 2017-01-24 DIAGNOSIS — I82622 Acute embolism and thrombosis of deep veins of left upper extremity: Secondary | ICD-10-CM | POA: Diagnosis not present

## 2017-01-24 DIAGNOSIS — I4510 Unspecified right bundle-branch block: Secondary | ICD-10-CM | POA: Diagnosis present

## 2017-01-24 DIAGNOSIS — R109 Unspecified abdominal pain: Secondary | ICD-10-CM

## 2017-01-24 DIAGNOSIS — Z66 Do not resuscitate: Secondary | ICD-10-CM | POA: Diagnosis not present

## 2017-01-24 DIAGNOSIS — N183 Chronic kidney disease, stage 3 (moderate): Secondary | ICD-10-CM | POA: Diagnosis present

## 2017-01-24 DIAGNOSIS — K56609 Unspecified intestinal obstruction, unspecified as to partial versus complete obstruction: Secondary | ICD-10-CM | POA: Diagnosis not present

## 2017-01-24 DIAGNOSIS — M545 Low back pain: Secondary | ICD-10-CM | POA: Diagnosis present

## 2017-01-24 DIAGNOSIS — E440 Moderate protein-calorie malnutrition: Secondary | ICD-10-CM | POA: Diagnosis present

## 2017-01-24 DIAGNOSIS — Z0189 Encounter for other specified special examinations: Secondary | ICD-10-CM

## 2017-01-24 DIAGNOSIS — L0291 Cutaneous abscess, unspecified: Secondary | ICD-10-CM | POA: Diagnosis not present

## 2017-01-24 DIAGNOSIS — R001 Bradycardia, unspecified: Secondary | ICD-10-CM | POA: Diagnosis present

## 2017-01-24 DIAGNOSIS — Z6821 Body mass index (BMI) 21.0-21.9, adult: Secondary | ICD-10-CM

## 2017-01-24 DIAGNOSIS — R112 Nausea with vomiting, unspecified: Secondary | ICD-10-CM

## 2017-01-24 DIAGNOSIS — K668 Other specified disorders of peritoneum: Secondary | ICD-10-CM

## 2017-01-24 DIAGNOSIS — N179 Acute kidney failure, unspecified: Secondary | ICD-10-CM | POA: Diagnosis present

## 2017-01-24 DIAGNOSIS — J9690 Respiratory failure, unspecified, unspecified whether with hypoxia or hypercapnia: Secondary | ICD-10-CM | POA: Diagnosis not present

## 2017-01-24 DIAGNOSIS — K5652 Intestinal adhesions [bands] with complete obstruction: Secondary | ICD-10-CM | POA: Diagnosis not present

## 2017-01-24 DIAGNOSIS — Y838 Other surgical procedures as the cause of abnormal reaction of the patient, or of later complication, without mention of misadventure at the time of the procedure: Secondary | ICD-10-CM | POA: Diagnosis not present

## 2017-01-24 DIAGNOSIS — Z4659 Encounter for fitting and adjustment of other gastrointestinal appliance and device: Secondary | ICD-10-CM

## 2017-01-24 DIAGNOSIS — B370 Candidal stomatitis: Secondary | ICD-10-CM | POA: Diagnosis not present

## 2017-01-24 DIAGNOSIS — T8132XA Disruption of internal operation (surgical) wound, not elsewhere classified, initial encounter: Secondary | ICD-10-CM | POA: Diagnosis not present

## 2017-01-24 DIAGNOSIS — Z452 Encounter for adjustment and management of vascular access device: Secondary | ICD-10-CM

## 2017-01-24 DIAGNOSIS — K920 Hematemesis: Secondary | ICD-10-CM

## 2017-01-24 DIAGNOSIS — E43 Unspecified severe protein-calorie malnutrition: Secondary | ICD-10-CM | POA: Diagnosis present

## 2017-01-24 DIAGNOSIS — K460 Unspecified abdominal hernia with obstruction, without gangrene: Secondary | ICD-10-CM | POA: Diagnosis present

## 2017-01-24 DIAGNOSIS — Z905 Acquired absence of kidney: Secondary | ICD-10-CM

## 2017-01-24 DIAGNOSIS — E870 Hyperosmolality and hypernatremia: Secondary | ICD-10-CM | POA: Diagnosis present

## 2017-01-24 DIAGNOSIS — Z87442 Personal history of urinary calculi: Secondary | ICD-10-CM

## 2017-01-24 DIAGNOSIS — K631 Perforation of intestine (nontraumatic): Secondary | ICD-10-CM | POA: Diagnosis not present

## 2017-01-24 DIAGNOSIS — N400 Enlarged prostate without lower urinary tract symptoms: Secondary | ICD-10-CM | POA: Diagnosis present

## 2017-01-24 DIAGNOSIS — G4700 Insomnia, unspecified: Secondary | ICD-10-CM | POA: Diagnosis present

## 2017-01-24 DIAGNOSIS — R739 Hyperglycemia, unspecified: Secondary | ICD-10-CM | POA: Diagnosis present

## 2017-01-24 DIAGNOSIS — K9189 Other postprocedural complications and disorders of digestive system: Secondary | ICD-10-CM | POA: Diagnosis not present

## 2017-01-24 DIAGNOSIS — F17220 Nicotine dependence, chewing tobacco, uncomplicated: Secondary | ICD-10-CM | POA: Diagnosis present

## 2017-01-24 DIAGNOSIS — Z86010 Personal history of colonic polyps: Secondary | ICD-10-CM

## 2017-01-24 DIAGNOSIS — R1033 Periumbilical pain: Secondary | ICD-10-CM

## 2017-01-24 DIAGNOSIS — I129 Hypertensive chronic kidney disease with stage 1 through stage 4 chronic kidney disease, or unspecified chronic kidney disease: Secondary | ICD-10-CM | POA: Diagnosis present

## 2017-01-24 DIAGNOSIS — R319 Hematuria, unspecified: Secondary | ICD-10-CM | POA: Diagnosis not present

## 2017-01-24 DIAGNOSIS — M7989 Other specified soft tissue disorders: Secondary | ICD-10-CM

## 2017-01-24 DIAGNOSIS — Z85528 Personal history of other malignant neoplasm of kidney: Secondary | ICD-10-CM

## 2017-01-24 DIAGNOSIS — R0602 Shortness of breath: Secondary | ICD-10-CM

## 2017-01-24 DIAGNOSIS — Z9889 Other specified postprocedural states: Secondary | ICD-10-CM

## 2017-01-24 DIAGNOSIS — K565 Intestinal adhesions [bands], unspecified as to partial versus complete obstruction: Secondary | ICD-10-CM

## 2017-01-24 DIAGNOSIS — Z8249 Family history of ischemic heart disease and other diseases of the circulatory system: Secondary | ICD-10-CM

## 2017-01-24 DIAGNOSIS — L8990 Pressure ulcer of unspecified site, unspecified stage: Secondary | ICD-10-CM | POA: Insufficient documentation

## 2017-01-24 DIAGNOSIS — K567 Ileus, unspecified: Secondary | ICD-10-CM | POA: Diagnosis not present

## 2017-01-24 DIAGNOSIS — Z978 Presence of other specified devices: Secondary | ICD-10-CM

## 2017-01-24 LAB — COMPREHENSIVE METABOLIC PANEL
ALK PHOS: 79 U/L (ref 38–126)
ALT: 14 U/L — AB (ref 17–63)
AST: 25 U/L (ref 15–41)
Albumin: 4.2 g/dL (ref 3.5–5.0)
Anion gap: 15 (ref 5–15)
BILIRUBIN TOTAL: 1.5 mg/dL — AB (ref 0.3–1.2)
BUN: 74 mg/dL — AB (ref 6–20)
CALCIUM: 9.6 mg/dL (ref 8.9–10.3)
CO2: 28 mmol/L (ref 22–32)
CREATININE: 2.61 mg/dL — AB (ref 0.61–1.24)
Chloride: 92 mmol/L — ABNORMAL LOW (ref 101–111)
GFR, EST AFRICAN AMERICAN: 24 mL/min — AB (ref >60)
GFR, EST NON AFRICAN AMERICAN: 21 mL/min — AB (ref >60)
Glucose, Bld: 146 mg/dL — ABNORMAL HIGH (ref 65–99)
Potassium: 4.2 mmol/L (ref 3.5–5.1)
Sodium: 135 mmol/L (ref 135–145)
TOTAL PROTEIN: 7.9 g/dL (ref 6.5–8.1)

## 2017-01-24 LAB — CBC
HCT: 43.3 % (ref 39.0–52.0)
Hemoglobin: 14.9 g/dL (ref 13.0–17.0)
MCH: 29.4 pg (ref 26.0–34.0)
MCHC: 34.4 g/dL (ref 30.0–36.0)
MCV: 85.4 fL (ref 78.0–100.0)
PLATELETS: 296 10*3/uL (ref 150–400)
RBC: 5.07 MIL/uL (ref 4.22–5.81)
RDW: 14 % (ref 11.5–15.5)
WBC: 5.9 10*3/uL (ref 4.0–10.5)

## 2017-01-24 LAB — LIPASE, BLOOD: Lipase: 23 U/L (ref 11–51)

## 2017-01-24 LAB — TYPE AND SCREEN
ABO/RH(D): A POS
Antibody Screen: NEGATIVE

## 2017-01-24 LAB — POC OCCULT BLOOD, ED: Fecal Occult Bld: NEGATIVE

## 2017-01-24 MED ORDER — ACETAMINOPHEN 325 MG PO TABS: 650.0000 mg | ORAL_TABLET | Freq: Four times a day (QID) | ORAL | Status: DC | PRN

## 2017-01-24 MED ORDER — DICLOFENAC SODIUM 1 % TD GEL
2.0000 g | Freq: Every day | TRANSDERMAL | Status: DC
  Filled 2017-01-24 (×2): qty 100

## 2017-01-24 MED ORDER — MORPHINE SULFATE (PF) 2 MG/ML IV SOLN: 2.0000 mg | INTRAVENOUS | Status: DC | PRN

## 2017-01-24 MED ORDER — SODIUM CHLORIDE 0.9 % IV SOLN
Freq: Once | INTRAVENOUS | Status: AC
  Administered 2017-01-24: 14:00:00 via INTRAVENOUS

## 2017-01-24 MED ORDER — ONDANSETRON HCL 4 MG PO TABS: 4.0000 mg | ORAL_TABLET | Freq: Four times a day (QID) | ORAL | Status: DC | PRN

## 2017-01-24 MED ORDER — IOPAMIDOL (ISOVUE-300) INJECTION 61%
INTRAVENOUS | Status: AC
  Administered 2017-01-24: 30 mL via ORAL
  Filled 2017-01-24: qty 30

## 2017-01-24 MED ORDER — ONDANSETRON HCL 4 MG/2ML IJ SOLN
4.0000 mg | Freq: Four times a day (QID) | INTRAMUSCULAR | Status: DC | PRN
  Administered 2017-01-31 – 2017-02-03 (×2): 4 mg via INTRAVENOUS
  Filled 2017-01-24 (×2): qty 2

## 2017-01-24 MED ORDER — ENOXAPARIN SODIUM 30 MG/0.3ML ~~LOC~~ SOLN: 30.0000 mg | SUBCUTANEOUS | Status: DC

## 2017-01-24 MED ORDER — ACETAMINOPHEN 650 MG RE SUPP: 650.0000 mg | Freq: Four times a day (QID) | RECTAL | Status: DC | PRN

## 2017-01-24 MED ORDER — PANTOPRAZOLE SODIUM 40 MG IV SOLR
80.0000 mg | Freq: Once | INTRAVENOUS | Status: AC
  Administered 2017-01-24: 80 mg via INTRAVENOUS
  Filled 2017-01-24: qty 80

## 2017-01-24 MED ORDER — SODIUM CHLORIDE 0.9 % IV BOLUS (SEPSIS)
500.0000 mL | Freq: Once | INTRAVENOUS | Status: AC
  Administered 2017-01-24: 500 mL via INTRAVENOUS

## 2017-01-24 MED ORDER — LACTATED RINGERS IV SOLN
INTRAVENOUS | Status: DC
  Administered 2017-01-24 – 2017-01-25 (×2): via INTRAVENOUS

## 2017-01-24 NOTE — Patient Instructions (Signed)
Am going to direct him to the ED.

## 2017-01-24 NOTE — ED Notes (Signed)
Patient transported to CT 

## 2017-01-24 NOTE — ED Notes (Signed)
Bladder scanner shows 30-100 cc .

## 2017-01-24 NOTE — ED Notes (Signed)
Repositioned NG tube.

## 2017-01-24 NOTE — ED Provider Notes (Signed)
Emergency Department Provider Note   I have reviewed the triage vital signs and the nursing notes.   HISTORY  Chief Complaint Abdominal Pain   HPI Joshua Downs is a 83 y.o. male with PMH of bradycardia, chronic back pain s/p recent laminectomy, HTN presents to the emergency department for evaluation of epigastric abdominal discomfort and multiple episodes of black emesis. Patient states symptoms began 3 days ago with abdominal discomfort and some associated constipation. Had multiple episodes of emesis 2 days ago that were black in color and resembled coffee grounds. He's had continued abdominal discomfort and yesterday had additional vomiting that was black. Patient is not on anticoagulation. Abdominal pain is mostly epigastric with no radiation. He is been unable to have a bowel movement the last 3 days. Denies any lightheadedness or dyspnea but does complain of generalized fatigue. No heart palpitations or chest pain. No modifying factors.   Patient also notes that he has been constipated and not urinating as much over the last several days.    Past Medical History:  Diagnosis Date  . Arthritis   . Bradycardia    says normal HR runs about 40-44  . Cancer [facility]) 2004   left kidney  . Chronic back pain    DDD/stenosis  . Constipation    takes Dulcolax daily as needed as well as Colace  . Dysrhythmia    right BBB  . Enlarged prostate   . History of colon polyps   . History of kidney stones   . Hypertension    takes Lotensin daily  . Insomnia    takes Melatonin nightly as well as Benadryl  . Joint swelling   . Macular degeneration    takes Ocuvite and Fish Oil;dry  . Nocturia   . Numbness    in hands and feet  . Weakness    and heaviness;notices more in left but some in right    Patient Active Problem List   Diagnosis Date Noted  . Acute renal failure (ARF) ([place]) 01/02/2017  . HNP (herniated nucleus pulposus), lumbar 01/04/2017  . Essential hypertension  05/23/2016  . Arthritis 05/23/2016  . Melena 05/23/2016  . Abnormality of gait 03/25/2014  . Lumbar stenosis with neurogenic claudication 12/22/2013    Past Surgical History:  Procedure Laterality Date  . BACK SURGERY  2015  . BACK SURGERY  10/2012  . CARPAL TUNNEL RELEASE Right 02/18/2015   Procedure: RIGHT WRIST CARPAL TUNNEL RELEASE;  Surgeon: Daryll Brod, MD;  Location: [place];  Service: Orthopedics;  Laterality: Right;  . CARPAL TUNNEL RELEASE Left 04/13/2015   Procedure: LEFT CARPAL TUNNEL RELEASE;  Surgeon: Daryll Brod, MD;  Location: [place];  Service: Orthopedics;  Laterality: Left;  . COLONOSCOPY    . ESOPHAGOGASTRODUODENOSCOPY N/A 07/06/2016   Procedure: ESOPHAGOGASTRODUODENOSCOPY (EGD);  Surgeon: Rogene Houston, MD;  [facility];  Service: Endoscopy;  Laterality: N/A;  3:05  . HERNIA REPAIR    . left kidney portion removed  2004  . Left partial nephrectomy     2004, kidney cancer  . LUMBAR LAMINECTOMY/DECOMPRESSION MICRODISCECTOMY Left 01/04/2017   Procedure: LUMBAR ONE - LUMBAR TWO  LAMINOTOMY AND MICRODISCECTOMY;  Surgeon: Jovita Gamma, MD;  Location: [place];  Service: Neurosurgery;  Laterality: Left;  . PARATHYROIDECTOMY    . spot taken off of back  2008  . ULNAR NERVE TRANSPOSITION Right 02/18/2015   Procedure: DECOMPRESSION ULNAR NERVE RIGHT ELBOW;  Surgeon: Daryll Brod, MD;  Location: [facility];  Service: Orthopedics;  Laterality: Right;      Allergies No known allergies  Family History  Problem Relation Age of Onset  . Dementia Mother   . Cancer Father   . Hypertension Sister   . Multiple myeloma Son     Social History Social History  Substance Use Topics  . Smoking status: Former Smoker    Years: 20.00    Types: Cigars, Pipe  . Smokeless tobacco: Current User    Types: Chew     Comment: quit smoking in 1985  . Alcohol use No    Review of Systems  Constitutional: No fever/chills Eyes:  No visual changes. ENT: No sore throat. Cardiovascular: Denies chest pain. Respiratory: Denies shortness of breath. Gastrointestinal: Positive abdominal pain. Positive nausea and vomiting.  No diarrhea. Positive constipation. Genitourinary: Negative for dysuria. Positive decreased urine output.  Musculoskeletal: Positive for chronic back pain. Skin: Negative for rash. Neurological: Negative for headaches, focal weakness or numbness.  10-point ROS otherwise negative.  ____________________________________________   PHYSICAL EXAM:  VITAL SIGNS: ED Triage Vitals  Enc Vitals Group     BP 01/03/2017 1206 108/66     Pulse Rate 01/04/2017 1206 (!) 109     Resp 01/23/2017 1206 18     Temp 12/28/2016 1206 97.7 F (36.5 C)     Temp Source 01/13/2017 1206 Oral     SpO2 01/14/2017 1206 96 %     Weight 12/30/2016 1204 168 lb (76.2 kg)     Height 01/17/2017 1204 6' (1.829 m)     Pain Score 01/07/2017 1203 10   Constitutional: Alert and oriented. Well appearing and in no acute distress. Eyes: Conjunctivae are normal.   Head: Atraumatic. Nose: No congestion/rhinnorhea. Mouth/Throat: Mucous membranes are dry.  Neck: No stridor.   Cardiovascular: Tachycardia. Good peripheral circulation. Grossly normal heart sounds.   Respiratory: Normal respiratory effort.  No retractions. Lungs CTAB. Gastrointestinal: Soft with moderate diffuse tenderness to palpation. No rebound or guarding. Worse on the right side. Positive distention.  Musculoskeletal: No lower extremity tenderness nor edema. No gross deformities of extremities. Neurologic:  Normal speech and language. No gross focal neurologic deficits are appreciated.  Skin:  Skin is warm, dry and intact. No rash noted. Psychiatric: Mood and affect are normal. Speech and behavior are normal.  ____________________________________________   LABS (all labs ordered are listed, but only abnormal results are displayed)  Labs Reviewed  COMPREHENSIVE METABOLIC PANEL -  Abnormal; Notable for the following:       Result Value   Chloride 92 (*)    Glucose, Bld 146 (*)    BUN 74 (*)    Creatinine, Ser 2.61 (*)    ALT 14 (*)    Total Bilirubin 1.5 (*)    GFR calc non Af Amer 21 (*)    GFR calc Af Amer 24 (*)    All other components within normal limits  LIPASE, BLOOD  CBC  BASIC METABOLIC PANEL  CBC  POC OCCULT BLOOD, ED  TYPE AND SCREEN   ____________________________________________  EKG   EKG Interpretation  Date/Time:  Wednesday January 24 2017 12:15:12 EDT Ventricular Rate:  90 PR Interval:    QRS Duration: 160 QT Interval:  404 QTC Calculation: 495 R Axis:   -84 Text Interpretation:  Sinus rhythm RBBB and LAFB Left ventricular hypertrophy Inferior infarct, acute Lateral leads are also involved Similar to prior with increased rate. No STEMI.  Confirmed by Ahja Martello MD, Parrish Daddario [phone]) on 01/23/2017  12:51:16 PM       ____________________________________________  RADIOLOGY  Ct Abdomen Pelvis Wo Contrast  Result Date: 01/21/2017 CLINICAL DATA:  83 year old male with coffee ground emesis and abdominal pain. Nausea vomiting for 3 days. EXAM: CT ABDOMEN AND PELVIS WITHOUT CONTRAST TECHNIQUE: Multidetector CT imaging of the abdomen and pelvis was performed following the standard protocol without IV contrast. COMPARISON:  Abdomen MRI 12/21/2016. CT Abdomen and Pelvis 04/17/2005 FINDINGS: Lower chest: Negative.  No pericardial or pleural effusion. There is mild oral contrast distention of the visible distal esophagus. See gastrointestinal findings below. Hepatobiliary: Chronic cholelithiasis. Calcified up to 18 mm gallstones. The gallbladder is contracted. Negative noncontrast liver. Pancreas: Negative. Spleen: Negative. Adrenals/Urinary Tract: Negative adrenal glands. Chronic right renal midpole benign-appearing cysts have mildly enlarged since 2006. No hydronephrosis. Unremarkable urinary bladder. No hydroureter. Stomach/Bowel: Moderate to severe gaseous  and contrast distension of the stomach. The duodenum is dilated. Proximal small bowel loops are moderately to severely dilated up to 5 cm diameter, and there is a high-grade abrupt transition point in the mid small bowel as seen on series 2, image 58 and coronal image 35. An obstructing etiology is unclear, but perhaps related to internal hernia. See sagittal images. Despite this severe dilatation of bowel there is no free air or free fluid. Distal to the transition point the small bowel loops are all decompressed to the terminal ileum. Negative appendix. Decompressed large bowel. Sigmoid diverticulosis. Vascular/Lymphatic: Vascular patency is not evaluated in the absence of IV contrast. Aortoiliac calcified atherosclerosis. No lymphadenopathy. Reproductive: Negative. Other: No pelvic free fluid. Musculoskeletal: Postoperative changes to the lumbar spine. Osteopenia. Degenerative changes about the left hip including probable multiple intramuscular synovial cysts, affecting the distal iliopsoas muscle. No acute osseous abnormality identified. IMPRESSION: 1. Severe, high-grade small bowel obstruction. Abrupt transition point in the mid abdomen as seen on coronal image 35. Obstructing etiology uncertain but perhaps related to internal hernia. 2. No free air or free fluid. 3.  Calcified aortic atherosclerosis. 4. Chronic cholelithiasis. Electronically Signed   By: Genevie Ann M.D.   On: 01/10/2017 15:11   Dg Chest Port 1 View  Result Date: 01/13/2017 CLINICAL DATA:  Encounter for nasogastric tube placement. EXAM: PORTABLE CHEST 1 VIEW COMPARISON:  Radiograph of same day. FINDINGS: Stable cardiomediastinal silhouette. No pneumothorax or pleural effusion is noted. The lungs are clear. Bony thorax is unremarkable. Distal tip of nasogastric tube is coiled within the proximal stomach. IMPRESSION: Distal tip of nasogastric tube is seen coiled within proximal stomach. Electronically Signed   By: Marijo Conception, M.D.   On:  12/30/2016 16:54   Dg Chest Portable 1 View  Result Date: 01/11/2017 CLINICAL DATA:  NG tube placement. EXAM: PORTABLE CHEST 1 VIEW COMPARISON:  10/18/2013. FINDINGS: NG tube noted. Its tip appears coiled in the distal esophagus. Cardiomegaly with normal pulmonary vascularity. Low lung volumes. No pleural effusion or pneumothorax. Mild gastric distention . IMPRESSION: 1. NG tube noted, its tip appears coiled in the distal esophagus. Repositioning should be considered. Mild gastric distention. 2.  Low lung volumes with mild basilar atelectasis. Critical Value/emergent results were called by telephone at the time of interpretation on 01/09/2017 at 4:07 pm to Dr. Alvino Chapel, who verbally acknowledged these results. Electronically Signed   By: Marcello Moores  Register   On: 01/25/2017 16:10    ____________________________________________   PROCEDURES  Procedure(s) performed:   Procedures  None ____________________________________________   INITIAL IMPRESSION / ASSESSMENT AND PLAN / ED COURSE  Pertinent labs & imaging results  that were available during my care of the patient were reviewed by me and considered in my medical decision making (see chart for details).  Patient resents to the emergency department for evaluation of vomiting black material in the past several days. He's had worsening epigastric abdominal pain. Patient recently had a laminectomy for chronic back pain. Reports some constipation and decreased urine output after the procedure. Not anticoagulated at this time. No hemodynamic instability. Abdomen is tender to palpation in epigastric region and worse on the right. Some distention noted. No peritoneal signs. Brown stool in rectal exam that is Hemoccult negative. History is concerning for active hematemesis. No active vomiting here in the emergency department. Plan for IV fluids, Protonix, and CT scan of the abdomen and pelvis given his amount of discomfort.  12:45 PM Patient with acute  renal failure on CMP. Hemoccult negative. Hemoglobin and hematocrit are normal. Plan for bladder scan to rule out obstructive process. Started IV fluids. Changed CT scan order from with IV contrast to without. Patient will drink oral contrast.   02:30 PM Spoke with Dr. Laural Golden and reviewed the case by phone. No indication for EGD at this time with hemoccult negative stool, normal Hb, and no active bloody emesis. Recommends consult if any of that changes. CT pending. Bladder scan shows 30-100 ml of urine so doubt obstructive uropathy. Plan for CT and admission for acute renal failure. Updated patient and family at bedside.   03:15 PM Spoke with Dr. Arnoldo Morale regarding the scan. Plan to place NG tube in the ED and Dr. Arnoldo Morale will see in consultation. No surgery now given renal failure. Will admit to medicine team for renal failure and will decide on surgery after renal failure resolves. Updated patient and family at bedside.   Discussed patient's case with hospitalist, Dr. Lorin Mercy. Patient and family (if present) updated with plan. Care transferred to [facility].  I reviewed all nursing notes, vitals, pertinent old records, EKGs, labs, imaging (as available).  ____________________________________________  FINAL CLINICAL IMPRESSION(S) / ED DIAGNOSES  Final diagnoses:  SBO (small bowel obstruction)  Acute renal failure, unspecified acute renal failure type ([place])     MEDICATIONS GIVEN DURING THIS VISIT:  Medications  diclofenac sodium (VOLTAREN) 1 % transdermal gel 2 g (not administered)  acetaminophen (TYLENOL) tablet 650 mg (not administered)    Or  acetaminophen (TYLENOL) suppository 650 mg (not administered)  ondansetron (ZOFRAN) tablet 4 mg (not administered)    Or  ondansetron (ZOFRAN) injection 4 mg (not administered)  enoxaparin (LOVENOX) injection 30 mg (not administered)  lactated ringers infusion ( Intravenous New Bag/Given 01/17/2017 1809)  morphine 2 MG/ML injection 2 mg (not  administered)  sodium chloride 0.9 % bolus 500 mL (0 mLs Intravenous Stopped 01/18/2017 1400)  pantoprazole (PROTONIX) injection 80 mg (80 mg Intravenous Given 01/06/2017 1254)  iopamidol (ISOVUE-300) 61 % injection (30 mLs Oral Contrast Given 01/08/2017 1246)  0.9 %  sodium chloride infusion ( Intravenous New Bag/Given 12/29/2016 1413)     NEW OUTPATIENT MEDICATIONS STARTED DURING THIS VISIT:  None   Note:  This document was prepared using Dragon voice recognition software and may include unintentional dictation errors.  Nanda Quinton, MD Emergency Medicine  Margette Fast, MD 01/03/2017 [phone]

## 2017-01-24 NOTE — ED Triage Notes (Signed)
Pt comes in with daughter. Pt went to PCPs office this morning for abdominal pain and because he was having black coffee ground emesis. Pt alert and oriented.

## 2017-01-24 NOTE — Progress Notes (Signed)
Subjective:    Patient ID: Joshua Downs, male    DOB: 1930-11-07, 83 y.o.   MRN: [id number]  HPI Presents today with c/o abdominal pain.  He presents today that on 01/04/2017 he had  back surgery by Dr. Sherwood Gambler.  He was on pain medications (narcotics).  Has been taking Doculax with the pain medications.  He says this past Saturday he had sharp pain across his epigastric region.  He says he has some nausea. He says he has been vomiting black.  He says right now he does not having any abdominal pain.  He has not had a BM since Saturday morning.  He says he cannot eat. Daughter states   states he weighed  was186. Today his weight is 168.2. Daughter states his abdomen is distended.  He can keep ice cream down. He continues to take pain medications.  States he vomited blood last night ( green with black).   07/06/2016 EGD: Melena:  Impression:               - Normal esophagus.                           - Z-line irregular at 43 cm from the incisors.                           - Normal stomach.                           - Normal duodenal bulb and second portion of the                            duodenum.                           - No specimens collected.                           comment: Patient could've bled from peptic ulcer                            disease in the setting of NSAID use and it has                            healed by now.   Last colonoscopy in 2008 by Dr. Gala Romney: IMPRESSION: 1. Normal rectum. 2. Sigmoid diverticula (few), diminutive polyp transverse colon removed as described above, otherwise, normal colon.  COLON, TRANSVERSE POLYP: BENIGN COLONIC MUCOSA WITH BENIGN LYMPHOID AGGREGATE. NO ADENOMATOUS CHANGE OR EVIDENCE OF MALIGNANCY.  Review of Systems Past Medical History:  Diagnosis Date  . Arthritis   . Arthritis   . Bradycardia    says normal HR runs about 40-44  . Cancer [facility]) 2004   left kidney  . Chronic back pain    DDD/stenosis  .  Constipation    takes Dulcolax daily as needed as well as Colace  . Dysrhythmia    right BBB  . Enlarged prostate   . History of colon polyps   . History of kidney stones   . Hypertension    takes Lotensin daily  . Insomnia    takes Melatonin nightly as well as  Benadryl  . Joint swelling   . Macular degeneration    takes Ocuvite and Fish Oil;dry  . Nocturia   . Numbness    in hands and feet  . Weakness    and heaviness;notices more in left but some in right    Past Surgical History:  Procedure Laterality Date  . BACK SURGERY  2015  . BACK SURGERY  10/2012  . CARPAL TUNNEL RELEASE Right 02/18/2015   Procedure: RIGHT WRIST CARPAL TUNNEL RELEASE;  Surgeon: Daryll Brod, MD;  Location: [place];  Service: Orthopedics;  Laterality: Right;  . CARPAL TUNNEL RELEASE Left 04/13/2015   Procedure: LEFT CARPAL TUNNEL RELEASE;  Surgeon: Daryll Brod, MD;  Location: [place];  Service: Orthopedics;  Laterality: Left;  . COLONOSCOPY    . ESOPHAGOGASTRODUODENOSCOPY N/A 07/06/2016   Procedure: ESOPHAGOGASTRODUODENOSCOPY (EGD);  Surgeon: Rogene Houston, MD;  [facility];  Service: Endoscopy;  Laterality: N/A;  3:05  . HERNIA REPAIR    . left kidney portion removed  2004  . Left partial nephrectomy     2004, kidney cancer  . LUMBAR LAMINECTOMY/DECOMPRESSION MICRODISCECTOMY Left 01/04/2017   Procedure: LUMBAR ONE - LUMBAR TWO  LAMINOTOMY AND MICRODISCECTOMY;  Surgeon: Jovita Gamma, MD;  Location: [place];  Service: Neurosurgery;  Laterality: Left;  . PARATHYROIDECTOMY    . spot taken off of back  2008  . ULNAR NERVE TRANSPOSITION Right 02/18/2015   Procedure: DECOMPRESSION ULNAR NERVE RIGHT ELBOW;  Surgeon: Daryll Brod, MD;  Location: [place];  Service: Orthopedics;  Laterality: Right;    Allergies  Allergen Reactions  . No Known Allergies     Current Outpatient Prescriptions on File Prior to Visit  Medication Sig Dispense Refill  .  benazepril-hydrochlorthiazide (LOTENSIN HCT) 20-12.5 MG per tablet Take 1 tablet by mouth daily.    . beta carotene w/minerals (OCUVITE) tablet Take 1 tablet by mouth daily.    . diclofenac sodium (VOLTAREN) 1 % GEL Apply 1 application topically daily. Left shoulder    . diphenhydrAMINE (BENADRYL) 25 MG tablet Take 50 mg by mouth at bedtime.     Marland Kitchen HYDROcodone-acetaminophen (NORCO/VICODIN) 5-325 MG tablet Take 1-2 tablets by mouth every 4 (four) hours as needed for moderate pain. 50 tablet 0  . Melatonin 10 MG TABS Take 10 mg by mouth at bedtime.    . Omega-3 Fatty Acids (FISH OIL) 1200 MG CAPS Take 2,400 mg by mouth 2 (two) times daily.      No current facility-administered medications on file prior to visit.        Objective:   Physical Exam Blood pressure 112/60, pulse 60, temperature 98 F (36.7 C), height 6' (1.829 m), weight 168 lb 3.2 oz (76.3 kg).Alert and oriented. Skin warm and dry. Oral mucosa is moist.   . Sclera anicteric, conjunctivae is pink. Thyroid not enlarged. No cervical lymphadenopathy. Lungs clear. Heart regular rate and rhythm.  Abdomen is soft. Bowel sounds are positive. No hepatomegaly. No abdominal masses felt. No tenderness.  No edema to lower extremities.           Assessment & Plan:  Hematochezia. Daughter states vomitus green with black specks. Constipation. Has been on narcotic medications for his recent back surgery. Am going to send him to the ED for further evaluation. I spoke with Leafy Ro (charge nurse concerning this patient)

## 2017-01-24 NOTE — H&P (Addendum)
History and Physical    Joshua Downs XOV:[id number] DOB: 01-06-1931 DOA: 01/22/2017  PCP: Deloria Lair, MD Consultants:  Christean Grief - urology Patient coming from: home - lives with daughter; Donald Prose: son, [phone]  Chief Complaint: abdominal pain  HPI: Joshua Downs is a 83 y.o. male with medical history significant of macular degeneration; HTN; remote left kidney CA s/p partial nephrectomy; chronic bradycardia; and recent back surgery presenting with abdominal pain.  He came by his PCP office on Monday and tried to get an appointment - his abdomen was swelling, no BMs, no UOP, n/v.  Symptoms started Saturday night into "Sunday morning.  He was seen today and they sent him to the ER.  Started with periumbilical pain - fleeting but would take your breath.  Got to the point where he didn't want to eat/drink.  Black emesis.  Last night about 0100, vomited more black emesis.  Last BM was Saturday AM and was normal.  No fevers.  Unable to pee up to once a day right now.  Last void was last night.  No burning with urination.  Abdominal surgeries: hernia; renal cancer.  Back surgery 3 weeks ago on 3/8.  He was given narcotics and dulcolax.     ED Course: IVF, Protonix.  CT: Severe high-grade SBO with abrupt transition point in the mid-abdomen - ?internal hernia.  Acute renal failure on labs.  Heme negative.  Called GI - no indication for EGD.  Called surgery - place NG tube, Dr. Jenkins will see patient.  Review of Systems: As per HPI; otherwise 10 point review of systems reviewed and negative.   Ambulatory Status:  ambualtes with a cane for balance  Past Medical History:  Diagnosis Date  . Arthritis   . Bradycardia    says normal HR runs about 40-44  . Cancer (HCC) 2004   left kidney  . Chronic back pain    DDD/stenosis  . Constipation    takes Dulcolax daily as needed as well as Colace  . Dysrhythmia    right BBB  . Enlarged prostate   . History of colon polyps   .  History of kidney stones   . Hypertension    takes Lotensin daily  . Insomnia    takes Melatonin nightly as well as Benadryl  . Joint swelling   . Macular degeneration    takes Ocuvite and Fish Oil;dry  . Nocturia   . Numbness    in hands and feet  . Weakness    and heaviness;notices more in left but some in right    Past Surgical History:  Procedure Laterality Date  . BACK SURGERY  2015  . BACK SURGERY  10/2012  . CARPAL TUNNEL RELEASE Right 02/18/2015   Procedure: RIGHT WRIST CARPAL TUNNEL RELEASE;  Surgeon: Gary Kuzma, MD;  Location: [facility];  Service: Orthopedics;  Laterality: Right;  . CARPAL TUNNEL RELEASE Left 04/13/2015   Procedure: LEFT CARPAL TUNNEL RELEASE;  Surgeon: Gary Kuzma, MD;  Location: [facility];  Service: Orthopedics;  Laterality: Left;  . COLONOSCOPY    . ESOPHAGOGASTRODUODENOSCOPY N/A 07/06/2016   Procedure: ESOPHAGOGASTRODUODENOSCOPY (EGD);  Surgeon: Najeeb U Rehman, MD;  Location: [facility];  Service: Endoscopy;  Laterality: N/A;  3:05  . HERNIA REPAIR    . left kidney portion removed  2004  . Left partial nephrectomy     20"$ 04, kidney cancer  . LUMBAR LAMINECTOMY/DECOMPRESSION MICRODISCECTOMY Left 01/04/2017   Procedure: LUMBAR  ONE - LUMBAR TWO  LAMINOTOMY AND MICRODISCECTOMY;  Surgeon: Jovita Gamma, MD;  Location: [facility];  Service: Neurosurgery;  Laterality: Left;  . PARATHYROIDECTOMY    . spot taken off of back  2008  . ULNAR NERVE TRANSPOSITION Right 02/18/2015   Procedure: DECOMPRESSION ULNAR NERVE RIGHT ELBOW;  Surgeon: Daryll Brod, MD;  Location: [facility];  Service: Orthopedics;  Laterality: Right;    Social History   Social History  . Marital status: Widowed    Spouse name: N/A  . Number of children: N/A  . Years of education: N/A   Occupational History  . retired    Social History Main Topics  . Smoking status: Former Smoker    Years: 20.00    Types: Cigars, Pipe  . Smokeless tobacco:  Current User    Types: Chew     Comment: quit smoking in 1985  . Alcohol use No  . Drug use: No  . Sexual activity: No   Other Topics Concern  . Not on file   Social History Narrative  . No narrative on file    Allergies  Allergen Reactions  . No Known Allergies     Family History  Problem Relation Age of Onset  . Dementia Mother   . Cancer Father   . Hypertension Sister   . Multiple myeloma Son     Prior to Admission medications   Medication Sig Start Date End Date Taking? Authorizing Provider  benazepril-hydrochlorthiazide (LOTENSIN HCT) 20-12.5 MG per tablet Take 1 tablet by mouth daily.   Yes Historical Provider, MD  beta carotene w/minerals (OCUVITE) tablet Take 1 tablet by mouth daily.   Yes Historical Provider, MD  diclofenac sodium (VOLTAREN) 1 % GEL Apply 1 application topically daily. Left shoulder   Yes Historical Provider, MD  diphenhydrAMINE (BENADRYL) 25 MG tablet Take 50 mg by mouth at bedtime.    Yes Historical Provider, MD  HYDROcodone-acetaminophen (NORCO/VICODIN) 5-325 MG tablet Take 1-2 tablets by mouth every 4 (four) hours as needed for moderate pain. 01/05/17  Yes Jovita Gamma, MD  Melatonin 10 MG TABS Take 10 mg by mouth at bedtime.   Yes Historical Provider, MD  Omega-3 Fatty Acids (FISH OIL) 1200 MG CAPS Take 2,400 mg by mouth 2 (two) times daily.    Yes Historical Provider, MD    Physical Exam: Vitals:   01/06/2017 1530 01/04/2017 1630 12/30/2016 1700 01/22/2017 1807  BP: (!) 142/93 121/83 118/76 108/66  Pulse: 89 91 93 91  Resp: (!) '22 18 19 20  '$ Temp:    97.9 F (36.6 C)  TempSrc:    Oral  SpO2: 96% 92% 95% 96%  Weight:    73.2 kg (161 lb 6.4 oz)  Height:    6' (1.829 m)     General:  Appears calm and comfortable and is NAD; NG tube in place, draining significant bilious drainage (about 800 cc in the first hour) Eyes:  PERRL, EOMI, normal lids, iris ENT:  grossly normal hearing, lips & tongue, mmm Neck:  no LAD, masses or  thyromegaly Cardiovascular:  RRR, no m/r/g. No LE edema.  Respiratory:  CTA bilaterally, no w/r/r. Normal respiratory effort. Abdomen:  soft, mild diffuse TTP, nd, scant to absent BS Skin:  no rash or induration seen on limited exam Musculoskeletal:  grossly normal tone BUE/BLE, good ROM, no bony abnormality.  Leg braces in place for B foot drop (associated with back problems) Psychiatric:  grossly normal mood and affect, speech fluent and  appropriate, AOx3 Neurologic:  CN 2-12 grossly intact, moves all extremities in coordinated fashion, sensation intact  Labs on Admission: I have personally reviewed following labs and imaging studies  CBC:  Recent Labs Lab 01/12/2017 1218  WBC 5.9  HGB 14.9  HCT 43.3  MCV 85.4  PLT 952   Basic Metabolic Panel:  Recent Labs Lab 01/21/2017 1218  NA 135  K 4.2  CL 92*  CO2 28  GLUCOSE 146*  BUN 74*  CREATININE 2.61*  CALCIUM 9.6   GFR: Estimated Creatinine Clearance: 21.8 mL/min (A) (by C-G formula based on SCr of 2.61 mg/dL (H)). Liver Function Tests:  Recent Labs Lab 01/07/2017 1218  AST 25  ALT 14*  ALKPHOS 79  BILITOT 1.5*  PROT 7.9  ALBUMIN 4.2    Recent Labs Lab 01/19/2017 1218  LIPASE 23   No results for input(s): AMMONIA in the last 168 hours. Coagulation Profile: No results for input(s): INR, PROTIME in the last 168 hours. Cardiac Enzymes: No results for input(s): CKTOTAL, CKMB, CKMBINDEX, TROPONINI in the last 168 hours. BNP (last 3 results) No results for input(s): PROBNP in the last 8760 hours. HbA1C: No results for input(s): HGBA1C in the last 72 hours. CBG: No results for input(s): GLUCAP in the last 168 hours. Lipid Profile: No results for input(s): CHOL, HDL, LDLCALC, TRIG, CHOLHDL, LDLDIRECT in the last 72 hours. Thyroid Function Tests: No results for input(s): TSH, T4TOTAL, FREET4, T3FREE, THYROIDAB in the last 72 hours. Anemia Panel: No results for input(s): VITAMINB12, FOLATE, FERRITIN, TIBC, IRON,  RETICCTPCT in the last 72 hours. Urine analysis: No results found for: COLORURINE, APPEARANCEUR, LABSPEC, PHURINE, GLUCOSEU, HGBUR, BILIRUBINUR, KETONESUR, PROTEINUR, UROBILINOGEN, NITRITE, LEUKOCYTESUR  Creatinine Clearance: Estimated Creatinine Clearance: 21.8 mL/min (A) (by C-G formula based on SCr of 2.61 mg/dL (H)).  Sepsis Labs: '@LABRCNTIP'$ (procalcitonin:4,lacticidven:4) )No results found for this or any previous visit (from the past 240 hour(s)).   Radiological Exams on Admission: Ct Abdomen Pelvis Wo Contrast  Result Date: 01/23/2017 CLINICAL DATA:  83 year old male with coffee ground emesis and abdominal pain. Nausea vomiting for 3 days. EXAM: CT ABDOMEN AND PELVIS WITHOUT CONTRAST TECHNIQUE: Multidetector CT imaging of the abdomen and pelvis was performed following the standard protocol without IV contrast. COMPARISON:  Abdomen MRI 12/21/2016. CT Abdomen and Pelvis 04/17/2005 FINDINGS: Lower chest: Negative.  No pericardial or pleural effusion. There is mild oral contrast distention of the visible distal esophagus. See gastrointestinal findings below. Hepatobiliary: Chronic cholelithiasis. Calcified up to 18 mm gallstones. The gallbladder is contracted. Negative noncontrast liver. Pancreas: Negative. Spleen: Negative. Adrenals/Urinary Tract: Negative adrenal glands. Chronic right renal midpole benign-appearing cysts have mildly enlarged since 2006. No hydronephrosis. Unremarkable urinary bladder. No hydroureter. Stomach/Bowel: Moderate to severe gaseous and contrast distension of the stomach. The duodenum is dilated. Proximal small bowel loops are moderately to severely dilated up to 5 cm diameter, and there is a high-grade abrupt transition point in the mid small bowel as seen on series 2, image 58 and coronal image 35. An obstructing etiology is unclear, but perhaps related to internal hernia. See sagittal images. Despite this severe dilatation of bowel there is no free air or free fluid.  Distal to the transition point the small bowel loops are all decompressed to the terminal ileum. Negative appendix. Decompressed large bowel. Sigmoid diverticulosis. Vascular/Lymphatic: Vascular patency is not evaluated in the absence of IV contrast. Aortoiliac calcified atherosclerosis. No lymphadenopathy. Reproductive: Negative. Other: No pelvic free fluid. Musculoskeletal: Postoperative changes to the lumbar spine. Osteopenia. Degenerative  changes about the left hip including probable multiple intramuscular synovial cysts, affecting the distal iliopsoas muscle. No acute osseous abnormality identified. IMPRESSION: 1. Severe, high-grade small bowel obstruction. Abrupt transition point in the mid abdomen as seen on coronal image 35. Obstructing etiology uncertain but perhaps related to internal hernia. 2. No free air or free fluid. 3.  Calcified aortic atherosclerosis. 4. Chronic cholelithiasis. Electronically Signed   By: Genevie Ann M.D.   On: 01/14/2017 15:11   Dg Chest Port 1 View  Result Date: 01/02/2017 CLINICAL DATA:  Encounter for nasogastric tube placement. EXAM: PORTABLE CHEST 1 VIEW COMPARISON:  Radiograph of same day. FINDINGS: Stable cardiomediastinal silhouette. No pneumothorax or pleural effusion is noted. The lungs are clear. Bony thorax is unremarkable. Distal tip of nasogastric tube is coiled within the proximal stomach. IMPRESSION: Distal tip of nasogastric tube is seen coiled within proximal stomach. Electronically Signed   By: Marijo Conception, M.D.   On: 01/08/2017 16:54   Dg Chest Portable 1 View  Result Date: 01/01/2017 CLINICAL DATA:  NG tube placement. EXAM: PORTABLE CHEST 1 VIEW COMPARISON:  10/18/2013. FINDINGS: NG tube noted. Its tip appears coiled in the distal esophagus. Cardiomegaly with normal pulmonary vascularity. Low lung volumes. No pleural effusion or pneumothorax. Mild gastric distention . IMPRESSION: 1. NG tube noted, its tip appears coiled in the distal esophagus.  Repositioning should be considered. Mild gastric distention. 2.  Low lung volumes with mild basilar atelectasis. Critical Value/emergent results were called by telephone at the time of interpretation on 01/08/2017 at 4:07 pm to Dr. Alvino Chapel, who verbally acknowledged these results. Electronically Signed   By: Marcello Moores  Register   On: 01/22/2017 16:10    EKG: Independently reviewed.  NSR with rate 90; RBBB and LAFB, LVH, nonspecific ST changes with no evidence of acute ischemia when compared to last EKG  Assessment/Plan Principal Problem:   Acute renal failure (ARF) (HCC) Active Problems:   SBO (small bowel obstruction)   Hyperglycemia   Acute renal failure -Patient with SBO (see below) and so inability to eat/drink, marked n/v -This led to dehydration which led to renal failure -Creatinine 2.61, BUN 74; prior 1.05, 17 on 3/7 -Anticipate improvement with IVF hydration -Bolus given in ER (515m) and then started on 125 cc/hr -Will continue with 125 cc/hr for now, but will need to decrease the rate once renal function has started normalizing - likely in the next 24 hours  SBO -By CT report, there appears to be a fairly significant obstruction that may require surgery -The NG tube is also drawing a copious amount of gastric contents -Dr. JArnoldo Moraleis aware of the patient -Will keep patient NPO -NG tube to LIWS -SCDs instead of Lovenox assuming probable need for surgery  Hyperglycemia -Glucose 146 -May be stress response -Will follow with fasting AM labs -It is unlikely that he will need acute or chronic treatment for this issue   DVT prophylaxis: SCDs Code Status:  Full - confirmed with patient/family Family Communication: Sons present throughout evaluation  Disposition Plan:  Home once clinically improved Consults called: Surgery; GI by telephone only   Admission status: Admit - It is my clinical opinion that admission to INPATIENT is reasonable and necessary because this patient  will require at least 2 midnights in the hospital to treat this condition based on the medical complexity of the problems presented.  Given the aforementioned information, the predictability of an adverse outcome is felt to be significant.    JKarmen BongoMD [facility]  If 7PM-7AM, please contact night-coverage [email or web address] Password [facility]  01/11/2017, 7:35 PM

## 2017-01-25 ENCOUNTER — Inpatient Hospital Stay (HOSPITAL_COMMUNITY): Payer: Medicare Other

## 2017-01-25 DIAGNOSIS — E43 Unspecified severe protein-calorie malnutrition: Secondary | ICD-10-CM | POA: Diagnosis present

## 2017-01-25 LAB — CBC
HEMATOCRIT: 39.6 % (ref 39.0–52.0)
HEMOGLOBIN: 13.3 g/dL (ref 13.0–17.0)
MCH: 28.9 pg (ref 26.0–34.0)
MCHC: 33.6 g/dL (ref 30.0–36.0)
MCV: 85.9 fL (ref 78.0–100.0)
Platelets: 253 10*3/uL (ref 150–400)
RBC: 4.61 MIL/uL (ref 4.22–5.81)
RDW: 14.1 % (ref 11.5–15.5)
WBC: 4.3 10*3/uL (ref 4.0–10.5)

## 2017-01-25 LAB — BASIC METABOLIC PANEL
ANION GAP: 12 (ref 5–15)
BUN: 85 mg/dL — ABNORMAL HIGH (ref 6–20)
CO2: 29 mmol/L (ref 22–32)
Calcium: 8.8 mg/dL — ABNORMAL LOW (ref 8.9–10.3)
Chloride: 96 mmol/L — ABNORMAL LOW (ref 101–111)
Creatinine, Ser: 2.26 mg/dL — ABNORMAL HIGH (ref 0.61–1.24)
GFR calc Af Amer: 29 mL/min — ABNORMAL LOW (ref >60)
GFR, EST NON AFRICAN AMERICAN: 25 mL/min — AB (ref >60)
GLUCOSE: 109 mg/dL — AB (ref 65–99)
POTASSIUM: 3.6 mmol/L (ref 3.5–5.1)
Sodium: 137 mmol/L (ref 135–145)

## 2017-01-25 MED ORDER — BISACODYL 10 MG RE SUPP
10.0000 mg | Freq: Two times a day (BID) | RECTAL | Status: DC
  Administered 2017-01-25 – 2017-01-26 (×3): 10 mg via RECTAL
  Filled 2017-01-25 (×3): qty 1

## 2017-01-25 MED ORDER — SODIUM CHLORIDE 0.9 % IV SOLN: INTRAVENOUS | Status: DC

## 2017-01-25 MED ORDER — POTASSIUM CHLORIDE IN NACL 40-0.9 MEQ/L-% IV SOLN
INTRAVENOUS | Status: DC
  Administered 2017-01-25: 100 mL/h via INTRAVENOUS

## 2017-01-25 MED ORDER — POTASSIUM CHLORIDE IN NACL 40-0.9 MEQ/L-% IV SOLN
INTRAVENOUS | Status: DC
  Administered 2017-01-25 – 2017-01-27 (×5): 100 mL/h via INTRAVENOUS

## 2017-01-25 NOTE — Consult Note (Signed)
Reason for Consult: Small bowel obstruction Referring Physician: Dr. [name] is an 83 y.o. male.  HPI: Patient is an 83 year old white male status post lumbar laminectomy on 01/04/2017 who presents with a four-day history of worsening abdominal distention, nausea, and vomiting. He states that for days ago, he started having increasing swelling in his abdomen. He did have a bowel movement that morning. Since then, he has had decreased appetite with the nausea. He started having some coffee-ground emesis and presented to the emergency room for further evaluation treatment. He has never had an episode like this before. He denies any fever or chills. CT scan of the abdomen revealed a bowel obstruction with possible transition point in the mid abdomen. An NG tube was placed and the patient admitted to the hospital for further evaluation treatment. He was noted to be in acute renal insufficiency, most likely secondary to dehydration and third space fluid loss. Overnight, he states his abdomen feels much better and less distended. He has not passed flatus or had a bowel movement yet.  Past Medical History:  Diagnosis Date  . Arthritis   . Bradycardia    says normal HR runs about 40-44  . Cancer [facility]) 2004   left kidney  . Chronic back pain    DDD/stenosis  . Constipation    takes Dulcolax daily as needed as well as Colace  . Dysrhythmia    right BBB  . Enlarged prostate   . History of colon polyps   . History of kidney stones   . Hypertension    takes Lotensin daily  . Insomnia    takes Melatonin nightly as well as Benadryl  . Joint swelling   . Macular degeneration    takes Ocuvite and Fish Oil;dry  . Nocturia   . Numbness    in hands and feet  . Weakness    and heaviness;notices more in left but some in right    Past Surgical History:  Procedure Laterality Date  . BACK SURGERY  2015  . BACK SURGERY  10/2012  . CARPAL TUNNEL RELEASE Right 02/18/2015   Procedure: RIGHT  WRIST CARPAL TUNNEL RELEASE;  Surgeon: Daryll Brod, MD;  Location: [place];  Service: Orthopedics;  Laterality: Right;  . CARPAL TUNNEL RELEASE Left 04/13/2015   Procedure: LEFT CARPAL TUNNEL RELEASE;  Surgeon: Daryll Brod, MD;  Location: [place];  Service: Orthopedics;  Laterality: Left;  . COLONOSCOPY    . ESOPHAGOGASTRODUODENOSCOPY N/A 07/06/2016   Procedure: ESOPHAGOGASTRODUODENOSCOPY (EGD);  Surgeon: Rogene Houston, MD;  [facility];  Service: Endoscopy;  Laterality: N/A;  3:05  . HERNIA REPAIR    . left kidney portion removed  2004  . Left partial nephrectomy     2004, kidney cancer  . LUMBAR LAMINECTOMY/DECOMPRESSION MICRODISCECTOMY Left 01/04/2017   Procedure: LUMBAR ONE - LUMBAR TWO  LAMINOTOMY AND MICRODISCECTOMY;  Surgeon: Jovita Gamma, MD;  Location: [place];  Service: Neurosurgery;  Laterality: Left;  . PARATHYROIDECTOMY    . spot taken off of back  2008  . ULNAR NERVE TRANSPOSITION Right 02/18/2015   Procedure: DECOMPRESSION ULNAR NERVE RIGHT ELBOW;  Surgeon: Daryll Brod, MD;  Location: [place];  Service: Orthopedics;  Laterality: Right;    Family History  Problem Relation Age of Onset  . Dementia Mother   . Cancer Father   . Hypertension Sister   . Multiple myeloma Son     Social History:  reports that  he has quit smoking. His smoking use included Cigars and Pipe. He quit after 20.00 years of use. His smokeless tobacco use includes Chew. He reports that he does not drink alcohol or use drugs.  Allergies:  Allergies  Allergen Reactions  . No Known Allergies     Medications:  Prior to Admission:  Prescriptions Prior to Admission  Medication Sig Dispense Refill Last Dose  . benazepril-hydrochlorthiazide (LOTENSIN HCT) 20-12.5 MG per tablet Take 1 tablet by mouth daily.   01/15/2017 at Unknown time  . beta carotene w/minerals (OCUVITE) tablet Take 1 tablet by mouth daily.   01/27/2017 at Unknown time  .  diclofenac sodium (VOLTAREN) 1 % GEL Apply 1 application topically daily. Left shoulder   01/22/2017 at Unknown time  . diphenhydrAMINE (BENADRYL) 25 MG tablet Take 50 mg by mouth at bedtime.    01/23/2017 at Unknown time  . HYDROcodone-acetaminophen (NORCO/VICODIN) 5-325 MG tablet Take 1-2 tablets by mouth every 4 (four) hours as needed for moderate pain. 50 tablet 0 01/07/2017 at Unknown time  . Melatonin 10 MG TABS Take 10 mg by mouth at bedtime.   01/23/2017 at Unknown time  . Omega-3 Fatty Acids (FISH OIL) 1200 MG CAPS Take 2,400 mg by mouth 2 (two) times daily.    01/13/2017 at Unknown time   Scheduled: . diclofenac sodium  2 g Topical Daily    Results for orders placed or performed during the hospital encounter of 01/21/2017 (from the past 48 hour(s))  Lipase, blood     Status: None   Collection Time: 01/02/2017 12:18 PM  Result Value Ref Range   Lipase 23 11 - 51 U/L  Comprehensive metabolic panel     Status: Abnormal   Collection Time: 01/15/2017 12:18 PM  Result Value Ref Range   Sodium 135 135 - 145 mmol/L   Potassium 4.2 3.5 - 5.1 mmol/L   Chloride 92 (L) 101 - 111 mmol/L   CO2 28 22 - 32 mmol/L   Glucose, Bld 146 (H) 65 - 99 mg/dL   BUN 74 (H) 6 - 20 mg/dL   Creatinine, Ser 2.61 (H) 0.61 - 1.24 mg/dL   Calcium 9.6 8.9 - 10.3 mg/dL   Total Protein 7.9 6.5 - 8.1 g/dL   Albumin 4.2 3.5 - 5.0 g/dL   AST 25 15 - 41 U/L   ALT 14 (L) 17 - 63 U/L   Alkaline Phosphatase 79 38 - 126 U/L   Total Bilirubin 1.5 (H) 0.3 - 1.2 mg/dL   GFR calc non Af Amer 21 (L) >60 mL/min   GFR calc Af Amer 24 (L) >60 mL/min    Comment: (NOTE) The eGFR has been calculated using the CKD EPI equation. This calculation has not been validated in all clinical situations. eGFR's persistently <60 mL/min signify possible Chronic Kidney Disease.    Anion gap 15 5 - 15  CBC     Status: None   Collection Time: 01/04/2017 12:18 PM  Result Value Ref Range   WBC 5.9 4.0 - 10.5 K/uL   RBC 5.07 4.22 - 5.81 MIL/uL    Hemoglobin 14.9 13.0 - 17.0 g/dL   HCT 43.3 39.0 - 52.0 %   MCV 85.4 78.0 - 100.0 fL   MCH 29.4 26.0 - 34.0 pg   MCHC 34.4 30.0 - 36.0 g/dL   RDW 14.0 11.5 - 15.5 %   Platelets 296 150 - 400 K/uL  Type and screen [facility]     Status: None  Collection Time: 01/05/2017 12:25 PM  Result Value Ref Range   ABO/RH(D) A POS    Antibody Screen NEG    Sample Expiration 01/27/2017   POC occult blood, ED Provider will collect     Status: None   Collection Time: 01/10/2017 12:40 PM  Result Value Ref Range   Fecal Occult Bld NEGATIVE NEGATIVE  Basic metabolic panel     Status: Abnormal   Collection Time: 01/25/17  5:36 AM  Result Value Ref Range   Sodium 137 135 - 145 mmol/L   Potassium 3.6 3.5 - 5.1 mmol/L   Chloride 96 (L) 101 - 111 mmol/L   CO2 29 22 - 32 mmol/L   Glucose, Bld 109 (H) 65 - 99 mg/dL   BUN 85 (H) 6 - 20 mg/dL   Creatinine, Ser 2.26 (H) 0.61 - 1.24 mg/dL   Calcium 8.8 (L) 8.9 - 10.3 mg/dL   GFR calc non Af Amer 25 (L) >60 mL/min   GFR calc Af Amer 29 (L) >60 mL/min    Comment: (NOTE) The eGFR has been calculated using the CKD EPI equation. This calculation has not been validated in all clinical situations. eGFR's persistently <60 mL/min signify possible Chronic Kidney Disease.    Anion gap 12 5 - 15  CBC     Status: None   Collection Time: 01/25/17  5:36 AM  Result Value Ref Range   WBC 4.3 4.0 - 10.5 K/uL   RBC 4.61 4.22 - 5.81 MIL/uL   Hemoglobin 13.3 13.0 - 17.0 g/dL   HCT 39.6 39.0 - 52.0 %   MCV 85.9 78.0 - 100.0 fL   MCH 28.9 26.0 - 34.0 pg   MCHC 33.6 30.0 - 36.0 g/dL   RDW 14.1 11.5 - 15.5 %   Platelets 253 150 - 400 K/uL    Ct Abdomen Pelvis Wo Contrast  Result Date: 12/28/2016 CLINICAL DATA:  83 year old male with coffee ground emesis and abdominal pain. Nausea vomiting for 3 days. EXAM: CT ABDOMEN AND PELVIS WITHOUT CONTRAST TECHNIQUE: Multidetector CT imaging of the abdomen and pelvis was performed following the standard protocol without IV  contrast. COMPARISON:  Abdomen MRI 12/21/2016. CT Abdomen and Pelvis 04/17/2005 FINDINGS: Lower chest: Negative.  No pericardial or pleural effusion. There is mild oral contrast distention of the visible distal esophagus. See gastrointestinal findings below. Hepatobiliary: Chronic cholelithiasis. Calcified up to 18 mm gallstones. The gallbladder is contracted. Negative noncontrast liver. Pancreas: Negative. Spleen: Negative. Adrenals/Urinary Tract: Negative adrenal glands. Chronic right renal midpole benign-appearing cysts have mildly enlarged since 2006. No hydronephrosis. Unremarkable urinary bladder. No hydroureter. Stomach/Bowel: Moderate to severe gaseous and contrast distension of the stomach. The duodenum is dilated. Proximal small bowel loops are moderately to severely dilated up to 5 cm diameter, and there is a high-grade abrupt transition point in the mid small bowel as seen on series 2, image 58 and coronal image 35. An obstructing etiology is unclear, but perhaps related to internal hernia. See sagittal images. Despite this severe dilatation of bowel there is no free air or free fluid. Distal to the transition point the small bowel loops are all decompressed to the terminal ileum. Negative appendix. Decompressed large bowel. Sigmoid diverticulosis. Vascular/Lymphatic: Vascular patency is not evaluated in the absence of IV contrast. Aortoiliac calcified atherosclerosis. No lymphadenopathy. Reproductive: Negative. Other: No pelvic free fluid. Musculoskeletal: Postoperative changes to the lumbar spine. Osteopenia. Degenerative changes about the left hip including probable multiple intramuscular synovial cysts, affecting the distal iliopsoas muscle. No acute  osseous abnormality identified. IMPRESSION: 1. Severe, high-grade small bowel obstruction. Abrupt transition point in the mid abdomen as seen on coronal image 35. Obstructing etiology uncertain but perhaps related to internal hernia. 2. No free air or  free fluid. 3.  Calcified aortic atherosclerosis. 4. Chronic cholelithiasis. Electronically Signed   By: Genevie Ann M.D.   On: 01/14/2017 15:11   Dg Chest Port 1 View  Result Date: 01/19/2017 CLINICAL DATA:  Encounter for nasogastric tube placement. EXAM: PORTABLE CHEST 1 VIEW COMPARISON:  Radiograph of same day. FINDINGS: Stable cardiomediastinal silhouette. No pneumothorax or pleural effusion is noted. The lungs are clear. Bony thorax is unremarkable. Distal tip of nasogastric tube is coiled within the proximal stomach. IMPRESSION: Distal tip of nasogastric tube is seen coiled within proximal stomach. Electronically Signed   By: Marijo Conception, M.D.   On: 01/14/2017 16:54   Dg Chest Portable 1 View  Result Date: 01/05/2017 CLINICAL DATA:  NG tube placement. EXAM: PORTABLE CHEST 1 VIEW COMPARISON:  10/18/2013. FINDINGS: NG tube noted. Its tip appears coiled in the distal esophagus. Cardiomegaly with normal pulmonary vascularity. Low lung volumes. No pleural effusion or pneumothorax. Mild gastric distention . IMPRESSION: 1. NG tube noted, its tip appears coiled in the distal esophagus. Repositioning should be considered. Mild gastric distention. 2.  Low lung volumes with mild basilar atelectasis. Critical Value/emergent results were called by telephone at the time of interpretation on 01/04/2017 at 4:07 pm to Dr. Alvino Chapel, who verbally acknowledged these results. Electronically Signed   By: Marcello Moores  Register   On: 12/31/2016 16:10    ROS:  Pertinent items noted in HPI and remainder of comprehensive ROS otherwise negative.  Blood pressure 121/64, pulse 86, temperature 98.7 F (37.1 C), temperature source Oral, resp. rate 18, height 6' (1.829 m), weight 161 lb 6.4 oz (73.2 kg), SpO2 93 %. Physical Exam: Pleasant well-developed well-nourished white male no acute distress. Head is normocephalic, atraumatic. Neck is supple without lymphadenopathy. Lungs clear auscultation with equal breath sounds  bilaterally. Heart examination reveals a regular rate and rhythm without S3, S4, murmurs. Abdomen is soft, nontender, not particularly distended. No rigidity is noted. No hernias are noted. Occasional bowel sounds are appreciated. CT scan images personally reviewed. H&P reviewed.  Assessment/Plan: Impression: Small bowel obstruction, mechanical versus a component of ileus, status post back surgery. He was taking narcotics for pain. Plan: No need for acute surgical intervention. His renal function is improving. We'll continue NG tube decompression for now. We will follow with you.  Aviva Signs 01/25/2017, 8:42 AM

## 2017-01-25 NOTE — Progress Notes (Signed)
Initial Nutrition Assessment  DOCUMENTATION CODES:  Severe malnutrition in context of acute illness/injury  INTERVENTION:  Monitor for diet advancement  Was agreeable to supplementation when diet advanced to Clear liquids  NUTRITION DIAGNOSIS:  Malnutrition (Severe) related to inability to eat, nausea, vomiting caused by SBO as evidenced by loss of >5% bw in 3 weeks and an oral intake that met < or equal to 50% of needs for > or equal to 5 days  GOAL:  Patient will meet greater than or equal to 90% of their needs  MONITOR:  Diet advancement, Labs, Weight trends  REASON FOR ASSESSMENT:  Malnutrition Screening Tool    ASSESSMENT:  83 y/o male PMHx HTN, Remote L kidney cancer s/p partial nephrectomy. Had recent elective back surgery 3/8. Developed abdominal pain with distension Saturday-Sunday, followed by n/v and poor appetite. Ct shows high grade SBO and AKI . admitted for management.   Pt reports that he was at his baseline until Sunday morning which entailed eating  >3 meals of a regular diet a day with additional snacks.   Since Sunday morning, he has been able to tolerate almost no po intake. He cites an instance where he took a bite of a nab and before he put the remainder down on the table, he had to vomit. He was able to tolerate a couple tablespoons of liquids, but that is it. He also did not desire to eat.   Pt was 179 lbs at time of elective surgery, reflecting loss of 18 lbs (10% bw) x 3 weeks. This is a very significant wt loss, though presented dehydrated.   St this time, NG tube has been placed for decompression. Put out 1.7 Liters the last 2 shifts. NPO at this time, but was agreeable to supplements once diet is advanced  Physical exam reveals moderate temporal muscle loss, mild orbital/under arm fat wasting  Labs: Renal fx and BG improving.  Medications: Dulcolax, NACL w/ K   Recent Labs Lab 01/13/2017 1218 01/25/17 0536  NA 135 137  K 4.2 3.6  CL 92* 96*   CO2 28 29  BUN 74* 85*  CREATININE 2.61* 2.26*  CALCIUM 9.6 8.8*  GLUCOSE 146* 109*   Diet Order:  Diet NPO time specified  Skin: Surgical incision to back  Last BM:  3/24  Height:  Ht Readings from Last 1 Encounters:  01/25/2017 6' (1.829 m)   Weight:  Wt Readings from Last 1 Encounters:  01/05/2017 161 lb 6.4 oz (73.2 kg)   Wt Readings from Last 10 Encounters:  01/21/2017 161 lb 6.4 oz (73.2 kg)  01/23/2017 168 lb 3.2 oz (76.3 kg)  01/04/17 179 lb (81.2 kg)  01/03/17 179 lb 1.6 oz (81.2 kg)  07/06/16 182 lb (82.6 kg)  05/23/16 181 lb 9.6 oz (82.4 kg)  04/13/15 179 lb (81.2 kg)  02/18/15 182 lb (82.6 kg)  12/18/13 182 lb 12.2 oz (82.9 kg)  10/18/13 195 lb (88.5 kg)   Ideal Body Weight:  80.91 kg  BMI:  Body mass index is 21.89 kg/m.  Estimated Nutritional Needs:  Kcal:  2050-2250 (28-31 kcal/kg bw) Protein:  88-102 g Pro (1.2-1.4 g/kg bw) Fluid:  >1.8 L (25 ml/kg bw)  EDUCATION NEEDS:  No education needs identified at this time  Burtis Junes RD, LDN, [place] [facility] Pager: [phone] 01/25/2017 9:58 AM

## 2017-01-25 NOTE — Progress Notes (Signed)
PROGRESS NOTE                                                                                                                                                                                                             Patient Demographics:    Joshua Downs, is a 83 y.o. male, DOB - 1931-09-13, LDJ:[id number]  Admit date - 01/11/2017   Admitting Physician Karmen Bongo, MD  Outpatient Primary MD for the patient is [name],[name], MD  LOS - 1  Outpatient Specialists NONE  Chief Complaint  Patient presents with  . Abdominal Pain       Brief Narrative   83 year old male with history of hypertension, remote left kidney carcinoma status post partial nephrectomy, chronic bradycardia, recent back surgery who presented with abdominal pain for past 3 days. Denied any fevers, chills, nausea or vomiting. Denied dysuria or diarrhea. Patient had back surgery 3 weeks prior to admission and was given narcotics with Dulcolax.  In the ED CT of the abdomen showed high-grade small bowel obstruction with transition point in the mid abdomen. Also noted for acute kidney injury on labs. NG tube placed and admitted to hospitalist service. Surgery consulted.    Subjective:   Reports his abdominal discomfort to be better today. Has not passed flatus or had bowel movement.   Assessment  & Plan :    Principal Problem:   Acute renal failure (ARF) ([place]) Suspect this is prerenal with dehydration. Monitor with IV fluids. Avoid nephrotoxins. Check UA.  Active Problems:   SBO (small bowel obstruction) Possibly ileus with recent narcotic use following back surgery. Continue NG decompression. Keep nothing by mouth. Serial abdominal exam. Persistent small bowel obstruction noted on x-ray this morning. As needed pain medications and antiemetics. IV hydration and keep potassium >4.     Protein-calorie malnutrition,  severe  Hypertension Stable.      Code Status : Full code  Family Communication  : None at bedside  Disposition Plan  : Home once improved  Barriers For Discharge : Active symptoms  Consults  :  Surgery  Procedures  : CT abdomen  DVT Prophylaxis  :  Lovenox -   Lab Results  Component Value Date   PLT 253 01/25/2017    Antibiotics  :   Anti-infectives    None  Objective:   Vitals:   01/23/2017 1700 01/14/2017 1807 12/31/2016 2143 01/25/17 0418  BP: 118/76 108/66 116/62 121/64  Pulse: 93 91 83 86  Resp: 19 20 18 18   Temp:  97.9 F (36.6 C) 98.6 F (37 C) 98.7 F (37.1 C)  TempSrc:  Oral Oral Oral  SpO2: 95% 96% 94% 93%  Weight:  73.2 kg (161 lb 6.4 oz)    Height:  6' (1.829 m)      Wt Readings from Last 3 Encounters:  01/20/2017 73.2 kg (161 lb 6.4 oz)  01/09/2017 76.3 kg (168 lb 3.2 oz)  01/04/17 81.2 kg (179 lb)     Intake/Output Summary (Last 24 hours) at 01/25/17 1339 Last data filed at 01/25/17 1118  Gross per 24 hour  Intake          1106.25 ml  Output             3300 ml  Net         -2193.75 ml     Physical Exam  Gen: not in distress HEENT:  moist mucosa, supple neck, NG in place Chest: clear b/l, no added sounds CVS: N S1&S2, no murmurs, GI: soft, mild distention, nontender, absent bowel sounds Musculoskeletal: warm, no edema     Data Review:    CBC  Recent Labs Lab 01/01/2017 1218 01/25/17 0536  WBC 5.9 4.3  HGB 14.9 13.3  HCT 43.3 39.6  PLT 296 253  MCV 85.4 85.9  MCH 29.4 28.9  MCHC 34.4 33.6  RDW 14.0 14.1    Chemistries   Recent Labs Lab 01/25/2017 1218 01/25/17 0536  NA 135 137  K 4.2 3.6  CL 92* 96*  CO2 28 29  GLUCOSE 146* 109*  BUN 74* 85*  CREATININE 2.61* 2.26*  CALCIUM 9.6 8.8*  AST 25  --   ALT 14*  --   ALKPHOS 79  --   BILITOT 1.5*  --    ------------------------------------------------------------------------------------------------------------------ No results for input(s): CHOL, HDL,  LDLCALC, TRIG, CHOLHDL, LDLDIRECT in the last 72 hours.  No results found for: HGBA1C ------------------------------------------------------------------------------------------------------------------ No results for input(s): TSH, T4TOTAL, T3FREE, THYROIDAB in the last 72 hours.  Invalid input(s): FREET3 ------------------------------------------------------------------------------------------------------------------ No results for input(s): VITAMINB12, FOLATE, FERRITIN, TIBC, IRON, RETICCTPCT in the last 72 hours.  Coagulation profile No results for input(s): INR, PROTIME in the last 168 hours.  No results for input(s): DDIMER in the last 72 hours.  Cardiac Enzymes No results for input(s): CKMB, TROPONINI, MYOGLOBIN in the last 168 hours.  Invalid input(s): CK ------------------------------------------------------------------------------------------------------------------ No results found for: BNP  Inpatient Medications  Scheduled Meds: . bisacodyl  10 mg Rectal BID  . diclofenac sodium  2 g Topical Daily   Continuous Infusions: . 0.9 % NaCl with KCl 40 mEq / L 100 mL/hr (01/25/17 1000)   PRN Meds:.acetaminophen **OR** acetaminophen, morphine injection, ondansetron **OR** ondansetron (ZOFRAN) IV  Micro Results No results found for this or any previous visit (from the past 240 hour(s)).  Radiology Reports Ct Abdomen Pelvis Wo Contrast  Result Date: 01/11/2017 CLINICAL DATA:  83 year old male with coffee ground emesis and abdominal pain. Nausea vomiting for 3 days. EXAM: CT ABDOMEN AND PELVIS WITHOUT CONTRAST TECHNIQUE: Multidetector CT imaging of the abdomen and pelvis was performed following the standard protocol without IV contrast. COMPARISON:  Abdomen MRI 12/21/2016. CT Abdomen and Pelvis 04/17/2005 FINDINGS: Lower chest: Negative.  No pericardial or pleural effusion. There is mild oral contrast distention of the visible distal esophagus. See  gastrointestinal findings  below. Hepatobiliary: Chronic cholelithiasis. Calcified up to 18 mm gallstones. The gallbladder is contracted. Negative noncontrast liver. Pancreas: Negative. Spleen: Negative. Adrenals/Urinary Tract: Negative adrenal glands. Chronic right renal midpole benign-appearing cysts have mildly enlarged since 2006. No hydronephrosis. Unremarkable urinary bladder. No hydroureter. Stomach/Bowel: Moderate to severe gaseous and contrast distension of the stomach. The duodenum is dilated. Proximal small bowel loops are moderately to severely dilated up to 5 cm diameter, and there is a high-grade abrupt transition point in the mid small bowel as seen on series 2, image 58 and coronal image 35. An obstructing etiology is unclear, but perhaps related to internal hernia. See sagittal images. Despite this severe dilatation of bowel there is no free air or free fluid. Distal to the transition point the small bowel loops are all decompressed to the terminal ileum. Negative appendix. Decompressed large bowel. Sigmoid diverticulosis. Vascular/Lymphatic: Vascular patency is not evaluated in the absence of IV contrast. Aortoiliac calcified atherosclerosis. No lymphadenopathy. Reproductive: Negative. Other: No pelvic free fluid. Musculoskeletal: Postoperative changes to the lumbar spine. Osteopenia. Degenerative changes about the left hip including probable multiple intramuscular synovial cysts, affecting the distal iliopsoas muscle. No acute osseous abnormality identified. IMPRESSION: 1. Severe, high-grade small bowel obstruction. Abrupt transition point in the mid abdomen as seen on coronal image 35. Obstructing etiology uncertain but perhaps related to internal hernia. 2. No free air or free fluid. 3.  Calcified aortic atherosclerosis. 4. Chronic cholelithiasis. Electronically Signed   By: Genevie Ann M.D.   On: 01/07/2017 15:11   Dg Lumbar Spine Complete  Result Date: 01/04/2017 CLINICAL DATA:  L1-2 laminotomy and microdiscectomy. EXAM:  LUMBAR SPINE - COMPLETE 4+ VIEW COMPARISON:  MRI of December 21, 2016. FINDINGS: Six intraoperative cross-table lateral projections were obtained of the lumbar spine. The first image demonstrates surgical localization of the posterior elements of L1. The second image demonstrates surgical probe directed toward posterior elements of L2. Third image demonstrates surgical probe positioned over posterior elements of L2. Fourth image demonstrates 2 surgical probes directed for posterior elements of L2. Fifth image demonstrates surgical probe directed toward posterior elements of L2. The 6 image demonstrates surgical probe also seen projected over posterior elements of L2. IMPRESSION: Surgical localization as described above. Electronically Signed   By: Marijo Conception, M.D.   On: 01/04/2017 16:16   Dg Chest Port 1 View  Result Date: 01/12/2017 CLINICAL DATA:  Encounter for nasogastric tube placement. EXAM: PORTABLE CHEST 1 VIEW COMPARISON:  Radiograph of same day. FINDINGS: Stable cardiomediastinal silhouette. No pneumothorax or pleural effusion is noted. The lungs are clear. Bony thorax is unremarkable. Distal tip of nasogastric tube is coiled within the proximal stomach. IMPRESSION: Distal tip of nasogastric tube is seen coiled within proximal stomach. Electronically Signed   By: Marijo Conception, M.D.   On: 01/13/2017 16:54   Dg Chest Portable 1 View  Result Date: 01/14/2017 CLINICAL DATA:  NG tube placement. EXAM: PORTABLE CHEST 1 VIEW COMPARISON:  10/18/2013. FINDINGS: NG tube noted. Its tip appears coiled in the distal esophagus. Cardiomegaly with normal pulmonary vascularity. Low lung volumes. No pleural effusion or pneumothorax. Mild gastric distention . IMPRESSION: 1. NG tube noted, its tip appears coiled in the distal esophagus. Repositioning should be considered. Mild gastric distention. 2.  Low lung volumes with mild basilar atelectasis. Critical Value/emergent results were called by telephone at the  time of interpretation on 01/12/2017 at 4:07 pm to Dr. Alvino Chapel, who verbally acknowledged these results. Electronically Signed  By: Baker   On: 01/02/2017 16:10   Dg Abd Portable 2v  Result Date: 01/25/2017 CLINICAL DATA:  Small bowel obstruction EXAM: PORTABLE ABDOMEN - 2 VIEW COMPARISON:  CT abdomen and pelvis January 24, 2017 FINDINGS: Supine and upright images obtained. Nasogastric tube tip and side port in stomach. There remains small bowel dilatation with air-fluid levels in a pattern suggesting persistent bowel obstruction. No evident free air. There is postoperative change in the lumbar spine. There are phleboliths in the pelvis. Is normal. There is no evidence of free air. No radio-opaque calculi or other significant radiographic abnormality is seen. IMPRESSION: Persistence of small bowel obstruction. No free air evident. Nasogastric tube tip and side port in stomach. Electronically Signed   By: Lowella Grip III M.D.   On: 01/25/2017 11:01    Time Spent in minutes  25   Louellen Molder M.D on 01/25/2017 at 1:39 PM  Between 7am to 7pm - Pager - [phone]  After 7pm go to [email or web address] - password [facility]  [facility] -  Office  [phone]

## 2017-01-25 NOTE — Progress Notes (Addendum)
Patient passed small amount of loose stool/mucus this evening after second dulcolax suppository given.  Only scant brown drainage noted from NG throughout shift today.

## 2017-01-26 ENCOUNTER — Inpatient Hospital Stay (HOSPITAL_COMMUNITY): Payer: Medicare Other

## 2017-01-26 LAB — BASIC METABOLIC PANEL
Anion gap: 11 (ref 5–15)
BUN: 73 mg/dL — AB (ref 6–20)
CO2: 25 mmol/L (ref 22–32)
CREATININE: 1.5 mg/dL — AB (ref 0.61–1.24)
Calcium: 8.7 mg/dL — ABNORMAL LOW (ref 8.9–10.3)
Chloride: 105 mmol/L (ref 101–111)
GFR calc Af Amer: 48 mL/min — ABNORMAL LOW (ref >60)
GFR, EST NON AFRICAN AMERICAN: 41 mL/min — AB (ref >60)
Glucose, Bld: 98 mg/dL (ref 65–99)
Potassium: 4.6 mmol/L (ref 3.5–5.1)
SODIUM: 141 mmol/L (ref 135–145)

## 2017-01-26 MED ORDER — BOOST / RESOURCE BREEZE PO LIQD
1.0000 | Freq: Two times a day (BID) | ORAL | Status: DC
  Administered 2017-01-26: 1 via ORAL

## 2017-01-26 MED ORDER — MAGNESIUM HYDROXIDE 400 MG/5ML PO SUSP
30.0000 mL | Freq: Two times a day (BID) | ORAL | Status: DC
  Administered 2017-01-26: 30 mL via ORAL
  Filled 2017-01-26 (×2): qty 30

## 2017-01-26 NOTE — Care Management Note (Signed)
Case Management Note  Patient Details  Name: Joshua Downs MRN: [id number] Date of Birth: 04/23/1931  Subjective/Objective:                  Pt admitted with ARF and SBO. Pt is from home, lives with his daughter. He drives himself to appointments, uses a cane with ambulation, has PCP who he sees regularly. He has no difficulty affording or managing medications. He has no HH or DME needs pta. No needs communicated.   Action/Plan: Plan for return home with self care.   Expected Discharge Date:    01/28/2017              Expected Discharge Plan:  Home/Self Care  In-House Referral:  NA  Discharge planning Services  CM Consult  Post Acute Care Choice:  NA Choice offered to:  NA  Status of Service:  Completed, signed off  Sherald Barge, RN 01/26/2017, 9:11 AM

## 2017-01-26 NOTE — Care Management Important Message (Signed)
Important Message  Patient Details  Name: Joshua Downs MRN: [id number] Date of Birth: 1931-05-03   Medicare Important Message Given:  Yes    Sherald Barge, RN 01/26/2017, 9:19 AM

## 2017-01-26 NOTE — Progress Notes (Signed)
Subjective: Patient had small bowel movement yesterday evening. He denies any abdominal pain.  Objective: Vital signs in last 24 hours: Temp:  [98.1 F (36.7 C)-98.7 F (37.1 C)] 98.1 F (36.7 C) (03/30 0536) Pulse Rate:  [68-89] 68 (03/30 0536) Resp:  [20] 20 (03/30 0536) BP: (135-138)/(68-86) 138/86 (03/30 0536) SpO2:  [93 %-95 %] 95 % (03/30 0536) Last BM Date: 01/20/17  Intake/Output from previous day: 03/29 0701 - 03/30 0700 In: 1285 [I.V.:1285] Out: 1650 [Urine:450; Emesis/NG output:1200] Intake/Output this shift: Total I/O In: -  Out: 50 [Stool:50]  General appearance: alert, cooperative and no distress GI: soft, non-tender; bowel sounds normal; no masses,  no organomegaly  Lab Results:   Recent Labs  01/23/2017 1218 01/25/17 0536  WBC 5.9 4.3  HGB 14.9 13.3  HCT 43.3 39.6  PLT 296 253   BMET  Recent Labs  01/25/17 0536 01/26/17 0517  NA 137 141  K 3.6 4.6  CL 96* 105  CO2 29 25  GLUCOSE 109* 98  BUN 85* 73*  CREATININE 2.26* 1.50*  CALCIUM 8.8* 8.7*   PT/INR No results for input(s): LABPROT, INR in the last 72 hours.  Studies/Results: Ct Abdomen Pelvis Wo Contrast  Result Date: 01/15/2017 CLINICAL DATA:  83 year old male with coffee ground emesis and abdominal pain. Nausea vomiting for 3 days. EXAM: CT ABDOMEN AND PELVIS WITHOUT CONTRAST TECHNIQUE: Multidetector CT imaging of the abdomen and pelvis was performed following the standard protocol without IV contrast. COMPARISON:  Abdomen MRI 12/21/2016. CT Abdomen and Pelvis 04/17/2005 FINDINGS: Lower chest: Negative.  No pericardial or pleural effusion. There is mild oral contrast distention of the visible distal esophagus. See gastrointestinal findings below. Hepatobiliary: Chronic cholelithiasis. Calcified up to 18 mm gallstones. The gallbladder is contracted. Negative noncontrast liver. Pancreas: Negative. Spleen: Negative. Adrenals/Urinary Tract: Negative adrenal glands. Chronic right renal  midpole benign-appearing cysts have mildly enlarged since 2006. No hydronephrosis. Unremarkable urinary bladder. No hydroureter. Stomach/Bowel: Moderate to severe gaseous and contrast distension of the stomach. The duodenum is dilated. Proximal small bowel loops are moderately to severely dilated up to 5 cm diameter, and there is a high-grade abrupt transition point in the mid small bowel as seen on series 2, image 58 and coronal image 35. An obstructing etiology is unclear, but perhaps related to internal hernia. See sagittal images. Despite this severe dilatation of bowel there is no free air or free fluid. Distal to the transition point the small bowel loops are all decompressed to the terminal ileum. Negative appendix. Decompressed large bowel. Sigmoid diverticulosis. Vascular/Lymphatic: Vascular patency is not evaluated in the absence of IV contrast. Aortoiliac calcified atherosclerosis. No lymphadenopathy. Reproductive: Negative. Other: No pelvic free fluid. Musculoskeletal: Postoperative changes to the lumbar spine. Osteopenia. Degenerative changes about the left hip including probable multiple intramuscular synovial cysts, affecting the distal iliopsoas muscle. No acute osseous abnormality identified. IMPRESSION: 1. Severe, high-grade small bowel obstruction. Abrupt transition point in the mid abdomen as seen on coronal image 35. Obstructing etiology uncertain but perhaps related to internal hernia. 2. No free air or free fluid. 3.  Calcified aortic atherosclerosis. 4. Chronic cholelithiasis. Electronically Signed   By: Genevie Ann M.D.   On: 01/17/2017 15:11   Dg Chest Port 1 View  Result Date: 01/06/2017 CLINICAL DATA:  Encounter for nasogastric tube placement. EXAM: PORTABLE CHEST 1 VIEW COMPARISON:  Radiograph of same day. FINDINGS: Stable cardiomediastinal silhouette. No pneumothorax or pleural effusion is noted. The lungs are clear. Bony thorax is unremarkable. Distal tip of  nasogastric tube is coiled  within the proximal stomach. IMPRESSION: Distal tip of nasogastric tube is seen coiled within proximal stomach. Electronically Signed   By: Marijo Conception, M.D.   On: 01/22/2017 16:54   Dg Chest Portable 1 View  Result Date: 12/30/2016 CLINICAL DATA:  NG tube placement. EXAM: PORTABLE CHEST 1 VIEW COMPARISON:  10/18/2013. FINDINGS: NG tube noted. Its tip appears coiled in the distal esophagus. Cardiomegaly with normal pulmonary vascularity. Low lung volumes. No pleural effusion or pneumothorax. Mild gastric distention . IMPRESSION: 1. NG tube noted, its tip appears coiled in the distal esophagus. Repositioning should be considered. Mild gastric distention. 2.  Low lung volumes with mild basilar atelectasis. Critical Value/emergent results were called by telephone at the time of interpretation on 12/28/2016 at 4:07 pm to Dr. Alvino Chapel, who verbally acknowledged these results. Electronically Signed   By: Marcello Moores  Register   On: 01/02/2017 16:10   Dg Abd Portable 2v  Result Date: 01/26/2017 CLINICAL DATA:  Abdominal pain. EXAM: PORTABLE ABDOMEN - 2 VIEW COMPARISON:  01/25/2017. FINDINGS: NG tube noted coiled in stomach. Persistent severely distended loops of small bowel are noted consistent small bowel obstruction. Findings consistent with persistent small-bowel obstruction. Some degree of air is noted colon. No free air is noted. Calcifications in the right upper quadrant consistent gallstones. Pelvic calcifications consistent phleboliths. Prior lumbar spine fusion. No free air. No free air. Lumbar spine fusion. IMPRESSION: 1. NG tube noted coiled stomach. Persistent severely distended loops of small bowel are again noted consistent small bowel obstruction. Some degree of air is noted in the colon No free air noted . 2.  Gallstones. Electronically Signed   By: Marcello Moores  Register   On: 01/26/2017 08:55   Dg Abd Portable 2v  Result Date: 01/25/2017 CLINICAL DATA:  Small bowel obstruction EXAM: PORTABLE ABDOMEN  - 2 VIEW COMPARISON:  CT abdomen and pelvis January 24, 2017 FINDINGS: Supine and upright images obtained. Nasogastric tube tip and side port in stomach. There remains small bowel dilatation with air-fluid levels in a pattern suggesting persistent bowel obstruction. No evident free air. There is postoperative change in the lumbar spine. There are phleboliths in the pelvis. Is normal. There is no evidence of free air. No radio-opaque calculi or other significant radiographic abnormality is seen. IMPRESSION: Persistence of small bowel obstruction. No free air evident. Nasogastric tube tip and side port in stomach. Electronically Signed   By: Lowella Grip III M.D.   On: 01/25/2017 11:01    Anti-infectives: Anti-infectives    None      Assessment/Plan: Impression: Small bowel obstruction, resolving. Renal insufficiency is also resolving. NG tube output has been minimal. Plan: We'll remove NG tube and start clear liquid diet.  LOS: 2 days    Aviva Signs 01/26/2017

## 2017-01-26 NOTE — Progress Notes (Signed)
NG removed this morning as patient SBO appeared to have resolved clinically. Daughter came to visit this afternoon and notified that patient was distended again and hadnt passed any flatus yet.  On exam his abdomen was quite distended with absent bowel sounds. Ordered to place back NG to wall suction and follow up x ray ordered. Keep NPO.

## 2017-01-26 NOTE — Progress Notes (Addendum)
PROGRESS NOTE                                                                                                                                                                                                             Patient Demographics:    Joshua Downs, is a 83 y.o. male, DOB - Oct 19, 1931, YQM:[id number]  Admit date - 01/12/2017   Admitting Physician Karmen Bongo, MD  Outpatient Primary MD for the patient is [name],[name], MD  LOS - 2  Outpatient Specialists NONE  Chief Complaint  Patient presents with  . Abdominal Pain       Brief Narrative   83 year old male with history of hypertension, remote left kidney carcinoma status post partial nephrectomy, chronic bradycardia, recent back surgery who presented with abdominal pain for past 3 days. Denied any fevers, chills, nausea or vomiting. Denied dysuria or diarrhea. Patient had back surgery 3 weeks prior to admission and was given narcotics with Dulcolax.  In the ED CT of the abdomen showed high-grade small bowel obstruction with transition point in the mid abdomen. Also noted for acute kidney injury on labs. NG tube placed and admitted to hospitalist service. Surgery consulted.    Subjective:   Had a small bowel movement last evening. Feels better.   Assessment  & Plan :    Principal Problem:   Acute renal failure (ARF) (HCC) Prerenal with dehydration. Improving with IV fluids.  Active Problems:  partial  SBO (small bowel obstruction) Possibly ileus with recent narcotic use following back surgery. Improving with NG decompression. Clinically appears to improved (x-ray this morning however still comments on persistent small bowel obstruction). Has good bowel sounds and had small bowel movement yesterday. Surgery consult appreciated. NG tube removed this morning and started on clears.      Protein-calorie malnutrition,  severe  Hypertension Stable.      Code Status : Full code  Family Communication  : None at bedside  Disposition Plan  : Home if continues to improve with advanced diet (possibly on 4/1)  Barriers For Discharge : Active symptoms  Consults  :  Surgery  Procedures  : CT abdomen  DVT Prophylaxis  :  Lovenox -   Lab Results  Component Value Date   PLT 253 01/25/2017    Antibiotics  :   Anti-infectives    None  Objective:   Vitals:   01/16/2017 2143 01/25/17 0418 01/25/17 1338 01/26/17 0536  BP: 116/62 121/64 135/68 138/86  Pulse: 83 86 89 68  Resp: 18 18 20 20   Temp: 98.6 F (37 C) 98.7 F (37.1 C) 98.7 F (37.1 C) 98.1 F (36.7 C)  TempSrc: Oral Oral Oral Oral  SpO2: 94% 93% 93% 95%  Weight:      Height:        Wt Readings from Last 3 Encounters:  01/16/2017 73.2 kg (161 lb 6.4 oz)  01/10/2017 76.3 kg (168 lb 3.2 oz)  01/04/17 81.2 kg (179 lb)     Intake/Output Summary (Last 24 hours) at 01/26/17 0945 Last data filed at 01/26/17 0900  Gross per 24 hour  Intake             1285 ml  Output             1700 ml  Net             -415 ml     Physical Exam  Gen: not in distress HEENT:  moist mucosa, supple neck, NG in place Chest: clear b/l, no added sounds CVS: N S1&S2, no murmurs, GI: soft, Distended, nontender, bowel sounds present Musculoskeletal: warm, no edema     Data Review:    CBC  Recent Labs Lab 01/22/2017 1218 01/25/17 0536  WBC 5.9 4.3  HGB 14.9 13.3  HCT 43.3 39.6  PLT 296 253  MCV 85.4 85.9  MCH 29.4 28.9  MCHC 34.4 33.6  RDW 14.0 14.1    Chemistries   Recent Labs Lab 01/04/2017 1218 01/25/17 0536 01/26/17 0517  NA 135 137 141  K 4.2 3.6 4.6  CL 92* 96* 105  CO2 28 29 25   GLUCOSE 146* 109* 98  BUN 74* 85* 73*  CREATININE 2.61* 2.26* 1.50*  CALCIUM 9.6 8.8* 8.7*  AST 25  --   --   ALT 14*  --   --   ALKPHOS 79  --   --   BILITOT 1.5*  --   --     ------------------------------------------------------------------------------------------------------------------ No results for input(s): CHOL, HDL, LDLCALC, TRIG, CHOLHDL, LDLDIRECT in the last 72 hours.  No results found for: HGBA1C ------------------------------------------------------------------------------------------------------------------ No results for input(s): TSH, T4TOTAL, T3FREE, THYROIDAB in the last 72 hours.  Invalid input(s): FREET3 ------------------------------------------------------------------------------------------------------------------ No results for input(s): VITAMINB12, FOLATE, FERRITIN, TIBC, IRON, RETICCTPCT in the last 72 hours.  Coagulation profile No results for input(s): INR, PROTIME in the last 168 hours.  No results for input(s): DDIMER in the last 72 hours.  Cardiac Enzymes No results for input(s): CKMB, TROPONINI, MYOGLOBIN in the last 168 hours.  Invalid input(s): CK ------------------------------------------------------------------------------------------------------------------ No results found for: BNP  Inpatient Medications  Scheduled Meds: . bisacodyl  10 mg Rectal BID  . diclofenac sodium  2 g Topical Daily  . magnesium hydroxide  30 mL Oral BID   Continuous Infusions: . 0.9 % NaCl with KCl 40 mEq / L 100 mL/hr (01/26/17 0123)   PRN Meds:.acetaminophen **OR** acetaminophen, morphine injection, ondansetron **OR** ondansetron (ZOFRAN) IV  Micro Results No results found for this or any previous visit (from the past 240 hour(s)).  Radiology Reports Ct Abdomen Pelvis Wo Contrast  Result Date: 01/09/2017 CLINICAL DATA:  83 year old male with coffee ground emesis and abdominal pain. Nausea vomiting for 3 days. EXAM: CT ABDOMEN AND PELVIS WITHOUT CONTRAST TECHNIQUE: Multidetector CT imaging of the abdomen and pelvis was performed following the standard protocol without IV  contrast. COMPARISON:  Abdomen MRI 12/21/2016. CT Abdomen  and Pelvis 04/17/2005 FINDINGS: Lower chest: Negative.  No pericardial or pleural effusion. There is mild oral contrast distention of the visible distal esophagus. See gastrointestinal findings below. Hepatobiliary: Chronic cholelithiasis. Calcified up to 18 mm gallstones. The gallbladder is contracted. Negative noncontrast liver. Pancreas: Negative. Spleen: Negative. Adrenals/Urinary Tract: Negative adrenal glands. Chronic right renal midpole benign-appearing cysts have mildly enlarged since 2006. No hydronephrosis. Unremarkable urinary bladder. No hydroureter. Stomach/Bowel: Moderate to severe gaseous and contrast distension of the stomach. The duodenum is dilated. Proximal small bowel loops are moderately to severely dilated up to 5 cm diameter, and there is a high-grade abrupt transition point in the mid small bowel as seen on series 2, image 58 and coronal image 35. An obstructing etiology is unclear, but perhaps related to internal hernia. See sagittal images. Despite this severe dilatation of bowel there is no free air or free fluid. Distal to the transition point the small bowel loops are all decompressed to the terminal ileum. Negative appendix. Decompressed large bowel. Sigmoid diverticulosis. Vascular/Lymphatic: Vascular patency is not evaluated in the absence of IV contrast. Aortoiliac calcified atherosclerosis. No lymphadenopathy. Reproductive: Negative. Other: No pelvic free fluid. Musculoskeletal: Postoperative changes to the lumbar spine. Osteopenia. Degenerative changes about the left hip including probable multiple intramuscular synovial cysts, affecting the distal iliopsoas muscle. No acute osseous abnormality identified. IMPRESSION: 1. Severe, high-grade small bowel obstruction. Abrupt transition point in the mid abdomen as seen on coronal image 35. Obstructing etiology uncertain but perhaps related to internal hernia. 2. No free air or free fluid. 3.  Calcified aortic atherosclerosis. 4. Chronic  cholelithiasis. Electronically Signed   By: Genevie Ann M.D.   On: 01/02/2017 15:11   Dg Lumbar Spine Complete  Result Date: 01/04/2017 CLINICAL DATA:  L1-2 laminotomy and microdiscectomy. EXAM: LUMBAR SPINE - COMPLETE 4+ VIEW COMPARISON:  MRI of December 21, 2016. FINDINGS: Six intraoperative cross-table lateral projections were obtained of the lumbar spine. The first image demonstrates surgical localization of the posterior elements of L1. The second image demonstrates surgical probe directed toward posterior elements of L2. Third image demonstrates surgical probe positioned over posterior elements of L2. Fourth image demonstrates 2 surgical probes directed for posterior elements of L2. Fifth image demonstrates surgical probe directed toward posterior elements of L2. The 6 image demonstrates surgical probe also seen projected over posterior elements of L2. IMPRESSION: Surgical localization as described above. Electronically Signed   By: Marijo Conception, M.D.   On: 01/04/2017 16:16   Dg Chest Port 1 View  Result Date: 01/18/2017 CLINICAL DATA:  Encounter for nasogastric tube placement. EXAM: PORTABLE CHEST 1 VIEW COMPARISON:  Radiograph of same day. FINDINGS: Stable cardiomediastinal silhouette. No pneumothorax or pleural effusion is noted. The lungs are clear. Bony thorax is unremarkable. Distal tip of nasogastric tube is coiled within the proximal stomach. IMPRESSION: Distal tip of nasogastric tube is seen coiled within proximal stomach. Electronically Signed   By: Marijo Conception, M.D.   On: 01/22/2017 16:54   Dg Chest Portable 1 View  Result Date: 01/26/2017 CLINICAL DATA:  NG tube placement. EXAM: PORTABLE CHEST 1 VIEW COMPARISON:  10/18/2013. FINDINGS: NG tube noted. Its tip appears coiled in the distal esophagus. Cardiomegaly with normal pulmonary vascularity. Low lung volumes. No pleural effusion or pneumothorax. Mild gastric distention . IMPRESSION: 1. NG tube noted, its tip appears coiled in the  distal esophagus. Repositioning should be considered. Mild gastric distention. 2.  Low lung volumes  with mild basilar atelectasis. Critical Value/emergent results were called by telephone at the time of interpretation on 12/28/2016 at 4:07 pm to Dr. Alvino Chapel, who verbally acknowledged these results. Electronically Signed   By: Marcello Moores  Register   On: 01/08/2017 16:10   Dg Abd Portable 2v  Result Date: 01/26/2017 CLINICAL DATA:  Abdominal pain. EXAM: PORTABLE ABDOMEN - 2 VIEW COMPARISON:  01/25/2017. FINDINGS: NG tube noted coiled in stomach. Persistent severely distended loops of small bowel are noted consistent small bowel obstruction. Findings consistent with persistent small-bowel obstruction. Some degree of air is noted colon. No free air is noted. Calcifications in the right upper quadrant consistent gallstones. Pelvic calcifications consistent phleboliths. Prior lumbar spine fusion. No free air. No free air. Lumbar spine fusion. IMPRESSION: 1. NG tube noted coiled stomach. Persistent severely distended loops of small bowel are again noted consistent small bowel obstruction. Some degree of air is noted in the colon No free air noted . 2.  Gallstones. Electronically Signed   By: Marcello Moores  Register   On: 01/26/2017 08:55   Dg Abd Portable 2v  Result Date: 01/25/2017 CLINICAL DATA:  Small bowel obstruction EXAM: PORTABLE ABDOMEN - 2 VIEW COMPARISON:  CT abdomen and pelvis January 24, 2017 FINDINGS: Supine and upright images obtained. Nasogastric tube tip and side port in stomach. There remains small bowel dilatation with air-fluid levels in a pattern suggesting persistent bowel obstruction. No evident free air. There is postoperative change in the lumbar spine. There are phleboliths in the pelvis. Is normal. There is no evidence of free air. No radio-opaque calculi or other significant radiographic abnormality is seen. IMPRESSION: Persistence of small bowel obstruction. No free air evident. Nasogastric tube tip  and side port in stomach. Electronically Signed   By: Lowella Grip III M.D.   On: 01/25/2017 11:01    Time Spent in minutes  25   Louellen Molder M.D on 01/26/2017 at 9:45 AM  Between 7am to 7pm - Pager - [phone]  After 7pm go to [email or web address] - password [facility]  [facility] -  Office  [phone]

## 2017-01-27 ENCOUNTER — Inpatient Hospital Stay (HOSPITAL_COMMUNITY): Payer: Medicare Other

## 2017-01-27 DIAGNOSIS — K5652 Intestinal adhesions [bands] with complete obstruction: Secondary | ICD-10-CM

## 2017-01-27 LAB — BASIC METABOLIC PANEL
Anion gap: 12 (ref 5–15)
BUN: 50 mg/dL — AB (ref 6–20)
CHLORIDE: 107 mmol/L (ref 101–111)
CO2: 27 mmol/L (ref 22–32)
CREATININE: 1.14 mg/dL (ref 0.61–1.24)
Calcium: 9.1 mg/dL (ref 8.9–10.3)
GFR calc Af Amer: >60 mL/min (ref >60)
GFR calc non Af Amer: 57 mL/min — ABNORMAL LOW (ref >60)
GLUCOSE: 122 mg/dL — AB (ref 65–99)
Potassium: 4.6 mmol/L (ref 3.5–5.1)
Sodium: 146 mmol/L — ABNORMAL HIGH (ref 135–145)

## 2017-01-27 MED ORDER — KCL IN DEXTROSE-NACL 20-5-0.45 MEQ/L-%-% IV SOLN
INTRAVENOUS | Status: AC
  Administered 2017-01-27 – 2017-01-31 (×9): via INTRAVENOUS

## 2017-01-27 MED ORDER — HYDRALAZINE HCL 20 MG/ML IJ SOLN: 5.0000 mg | INTRAMUSCULAR | Status: DC | PRN

## 2017-01-27 MED ORDER — LORAZEPAM 2 MG/ML IJ SOLN
0.5000 mg | Freq: Every day | INTRAMUSCULAR | Status: DC
Start: 2017-01-27 — End: 2017-02-14
  Administered 2017-01-27 – 2017-02-13 (×17): 0.5 mg via INTRAVENOUS
  Filled 2017-01-27 (×17): qty 1

## 2017-01-27 MED ORDER — BENAZEPRIL HCL 10 MG PO TABS
10.0000 mg | ORAL_TABLET | Freq: Every day | ORAL | Status: DC
Start: 2017-01-27 — End: 2017-01-27
  Filled 2017-01-27: qty 1

## 2017-01-27 MED ORDER — DEXTROSE-NACL 5-0.45 % IV SOLN
INTRAVENOUS | Status: DC
  Administered 2017-01-27: 10:00:00 via INTRAVENOUS

## 2017-01-27 MED ORDER — TRAZODONE HCL 50 MG PO TABS: 50.0000 mg | ORAL_TABLET | Freq: Every day | ORAL | Status: DC

## 2017-01-27 NOTE — Progress Notes (Signed)
SURGICAL PROGRESS NOTE (cpt [id number])  Hospital Day(s): 3.   Post op day(s):  Marland Kitchen   Interval History: Patient seen and examined, no acute events or new complaints overnight. Patient reports NG tube was removed yesterday when he began to feel "rumbling", but he did not pass flatus and became re-distended with recurrent abdominal pain and nausea after the NG tube was removed. Since the NG tube was re-inserted yesterday afternoon, he reports complete resolution of his abdominal pain with abdominal distention reportedly about half of what it was yesterday. Drainage canister is this morning full of dark, thick, bilious fluid. Patient otherwise denies N/V, CP, SOB, or fever/chills.  Review of Systems:  Constitutional: denies fever, chills  HEENT: denies cough or congestion  Respiratory: denies any shortness of breath  Cardiovascular: denies chest pain or palpitations  Gastrointestinal: abdominal pain, N/V, and bowel function as per interval history Genitourinary: denies burning with urination or urinary frequency Musculoskeletal: denies pain, decreased motor or sensation Integumentary: denies any other rashes or skin discolorations Neurological: denies HA or vision/hearing changes   Vital signs in last 24 hours: [min-max] current  Temp:  [97.8 F (36.6 C)-98.7 F (37.1 C)] 97.8 F (36.6 C) (03/31 0343) Pulse Rate:  [58-84] 58 (03/31 0343) Resp:  [15-20] 15 (03/31 0343) BP: (156-177)/(72-79) 177/72 (03/31 0343) SpO2:  [96 %-98 %] 98 % (03/31 0343)     Height: 6' (182.9 cm) Weight: 73.2 kg (161 lb 6.4 oz) BMI (Calculated): 21.9   Intake/Output this shift:  No intake/output data recorded.   Intake/Output last 2 shifts:  @IOLAST2SHIFTS @   Physical Exam:  Constitutional: alert, cooperative and no distress  HENT: normocephalic without obvious abnormality  Eyes: PERRL, EOM's grossly intact and symmetric  Neuro: CN II - XII grossly intact and symmetric without deficit  Respiratory: breathing  non-labored at rest  Cardiovascular: regular rate and sinus rhythm  Gastrointestinal: soft, non-tender, and moderately distended  Musculoskeletal: UE and LE FROM, no edema or wounds, motor and sensation grossly intact, NT   Labs:  CBC:  Lab Results  Component Value Date   WBC 4.3 01/25/2017   RBC 4.61 01/25/2017   BMP:  Lab Results  Component Value Date   GLUCOSE 122 (H) 01/27/2017   CO2 27 01/27/2017   BUN 50 (H) 01/27/2017   CREATININE 1.14 01/27/2017   CALCIUM 9.1 01/27/2017     Assessment/Plan: (ICD-10's: K56.52) 83 y.o. male with complete small bowel obstruction, likely attributable to post-surgical adhesions, complicated by small-moderate amount of narcotics for post-surgical pain s/p recent spine surgery and by pertinent comorbidities including HTN, chronic bradycardia with Right BBB, BPH, and osteoarthritis with chronic back pain.              - NPO, IVF              - NG tube for nasogastric decompression             - monitor ongoing bowel function and abdominal exam              - anticipate symptomatic relief within 24 - 48 hours following NGT insertion, followed by "rumbling" the following day and flatus either the same day or the day following the "rumbling" with anticipated length of stay ~3 - 5 days with successful non-operative management for 8 of 10 patients with small bowel obstruction secondary to adhesions             - medical management as per medical team             -  ambulation encouraged              - DVT prophylaxis  All of the above findings and recommendations were discussed with the patient and patient's family, and all of patient's and family's questions were answered to their expressed satisfaction.  -- Marilynne Drivers Rosana Hoes, MD, [place]: [facility] Office: [phone]

## 2017-01-27 NOTE — Progress Notes (Signed)
PROGRESS NOTE                                                                                                                                                                                                             Patient Demographics:    Joshua Downs, is a 83 y.o. male, DOB - [birth year], WPV:[id number]  Admit date - 01/14/2017   Admitting Physician Karmen Bongo, MD  Outpatient Primary MD for the patient is [name],[name], MD  LOS - 3  Outpatient Specialists NONE  Chief Complaint  Patient presents with  . Abdominal Pain       Brief Narrative   83 year old male with history of hypertension, remote left kidney carcinoma status post partial nephrectomy, chronic bradycardia, recent back surgery who presented with abdominal pain for past 3 days. Denied any fevers, chills, nausea or vomiting. Denied dysuria or diarrhea. Patient had back surgery 3 weeks prior to admission and was given narcotics with Dulcolax.  In the ED CT of the abdomen showed high-grade small bowel obstruction with transition point in the mid abdomen. Also noted for acute kidney injury on labs. NG tube placed and admitted to hospitalist service. Surgery consulted.    Subjective:   Patient says his last bowel movement was prior to admission. Denies nausea or vomiting. NG tube showing back bilous discharge 1.1L.   Assessment  & Plan :    Principal Problem:   Acute renal failure (ARF) ([place])- resolved. Back to baseline.  Prerenal with dehydration. Improving with IV fluids, cont IVF while NPO.  Active Problems:   SBO (small bowel obstruction)-  Per Surgical evaluation- likely attributable to postsurgical adhesions complicated by small to moderate amount of narcotics. - NG tube previously removed 3/30, reinserted due to recurrence of bowel distension, 1.1L that discussed fluid drained this a.m. - IV fluids - Surgery recommendations appreciated-  monitor ongoing bowel function on abdominal exam, ambulation encouraged  Chronic bradycardia stable.  - Avoid rate limiting medications  Hyponatremia- sodium 146 today, likely due to normal saline IV fluids. - Switch IV fluids to half-normal saline with D5, with 20 of KCl 100cc/hr - Daily BMP and mag  Hypertension- by mouth medications. Lisinopril HCTZ held due to nothing by mouth status - When necessary hydralazine 5 mg for blood pressure greater than 170/100  Protein-calorie malnutrition, severe  Hypertension Stable.  Code Status : Full code  Family Communication  : None at bedside  Disposition Plan  : Home if continues to improve with advanced diet (possibly on 4/1)  Barriers For Discharge : Active symptoms  Consults  :  Surgery  Procedures  : CT abdomen  DVT Prophylaxis  :  Lovenox -   Lab Results  Component Value Date   PLT 253 01/25/2017    Antibiotics  :   Anti-infectives    None        Objective:   Vitals:   01/26/17 2113 01/27/17 0343 01/27/17 1036 01/27/17 1449  BP: (!) 156/79 (!) 177/72 (!) 166/75 (!) 174/82  Pulse: 84 (!) 58 71 66  Resp: 20 15 16 18   Temp: 98.7 F (37.1 C) 97.8 F (36.6 C) 98.1 F (36.7 C) 98.6 F (37 C)  TempSrc: Oral Oral Oral Oral  SpO2: 96% 98% 97% 98%  Weight:      Height:        Wt Readings from Last 3 Encounters:  01/18/2017 73.2 kg (161 lb 6.4 oz)  01/22/2017 76.3 kg (168 lb 3.2 oz)  01/04/17 81.2 kg (179 lb)     Intake/Output Summary (Last 24 hours) at 01/27/17 1708 Last data filed at 01/27/17 1232  Gross per 24 hour  Intake             1500 ml  Output             2000 ml  Net             -500 ml     Physical Exam  Gen: not in distress HEENT:  moist mucosa, supple neck, NG in place Chest: clear b/l, no added sounds CVS: N S1&S2, no murmurs, GI: soft, Distended, nontender, bowel sounds present Musculoskeletal: warm, no edema     Data Review:    CBC  Recent Labs Lab 01/04/2017 1218  01/25/17 0536  WBC 5.9 4.3  HGB 14.9 13.3  HCT 43.3 39.6  PLT 296 253  MCV 85.4 85.9  MCH 29.4 28.9  MCHC 34.4 33.6  RDW 14.0 14.1    Chemistries   Recent Labs Lab 01/20/2017 1218 01/25/17 0536 01/26/17 0517 01/27/17 0604  NA 135 137 141 146*  K 4.2 3.6 4.6 4.6  CL 92* 96* 105 107  CO2 28 29 25 27   GLUCOSE 146* 109* 98 122*  BUN 74* 85* 73* 50*  CREATININE 2.61* 2.26* 1.50* 1.14  CALCIUM 9.6 8.8* 8.7* 9.1  AST 25  --   --   --   ALT 14*  --   --   --   ALKPHOS 79  --   --   --   BILITOT 1.5*  --   --   --    ------------------------------------------------------------------------------------------------------------------ No results for input(s): CHOL, HDL, LDLCALC, TRIG, CHOLHDL, LDLDIRECT in the last 72 hours.  No results found for: HGBA1C ------------------------------------------------------------------------------------------------------------------ No results for input(s): TSH, T4TOTAL, T3FREE, THYROIDAB in the last 72 hours.  Invalid input(s): FREET3 ------------------------------------------------------------------------------------------------------------------ No results for input(s): VITAMINB12, FOLATE, FERRITIN, TIBC, IRON, RETICCTPCT in the last 72 hours.  Coagulation profile No results for input(s): INR, PROTIME in the last 168 hours.  No results for input(s): DDIMER in the last 72 hours.  Cardiac Enzymes No results for input(s): CKMB, TROPONINI, MYOGLOBIN in the last 168 hours.  Invalid input(s): CK ------------------------------------------------------------------------------------------------------------------ No results found for: BNP  Inpatient Medications  Scheduled Meds: . diclofenac sodium  2 g Topical Daily  Continuous Infusions: . dextrose 5 % and 0.45 % NaCl with KCl 20 mEq/L 100 mL/hr at 01/27/17 1232   PRN Meds:.acetaminophen **OR** acetaminophen, hydrALAZINE, ondansetron **OR** ondansetron (ZOFRAN) IV  Micro Results No  results found for this or any previous visit (from the past 240 hour(s)).  Radiology Reports Ct Abdomen Pelvis Wo Contrast  Result Date: 01/26/2017 CLINICAL DATA:  83 year old male with coffee ground emesis and abdominal pain. Nausea vomiting for 3 days. EXAM: CT ABDOMEN AND PELVIS WITHOUT CONTRAST TECHNIQUE: Multidetector CT imaging of the abdomen and pelvis was performed following the standard protocol without IV contrast. COMPARISON:  Abdomen MRI 12/21/2016. CT Abdomen and Pelvis 04/17/2005 FINDINGS: Lower chest: Negative.  No pericardial or pleural effusion. There is mild oral contrast distention of the visible distal esophagus. See gastrointestinal findings below. Hepatobiliary: Chronic cholelithiasis. Calcified up to 18 mm gallstones. The gallbladder is contracted. Negative noncontrast liver. Pancreas: Negative. Spleen: Negative. Adrenals/Urinary Tract: Negative adrenal glands. Chronic right renal midpole benign-appearing cysts have mildly enlarged since 2006. No hydronephrosis. Unremarkable urinary bladder. No hydroureter. Stomach/Bowel: Moderate to severe gaseous and contrast distension of the stomach. The duodenum is dilated. Proximal small bowel loops are moderately to severely dilated up to 5 cm diameter, and there is a high-grade abrupt transition point in the mid small bowel as seen on series 2, image 58 and coronal image 35. An obstructing etiology is unclear, but perhaps related to internal hernia. See sagittal images. Despite this severe dilatation of bowel there is no free air or free fluid. Distal to the transition point the small bowel loops are all decompressed to the terminal ileum. Negative appendix. Decompressed large bowel. Sigmoid diverticulosis. Vascular/Lymphatic: Vascular patency is not evaluated in the absence of IV contrast. Aortoiliac calcified atherosclerosis. No lymphadenopathy. Reproductive: Negative. Other: No pelvic free fluid. Musculoskeletal: Postoperative changes to the  lumbar spine. Osteopenia. Degenerative changes about the left hip including probable multiple intramuscular synovial cysts, affecting the distal iliopsoas muscle. No acute osseous abnormality identified. IMPRESSION: 1. Severe, high-grade small bowel obstruction. Abrupt transition point in the mid abdomen as seen on coronal image 35. Obstructing etiology uncertain but perhaps related to internal hernia. 2. No free air or free fluid. 3.  Calcified aortic atherosclerosis. 4. Chronic cholelithiasis. Electronically Signed   By: Genevie Ann M.D.   On: 01/10/2017 15:11   Dg Lumbar Spine Complete  Result Date: 01/04/2017 CLINICAL DATA:  L1-2 laminotomy and microdiscectomy. EXAM: LUMBAR SPINE - COMPLETE 4+ VIEW COMPARISON:  MRI of December 21, 2016. FINDINGS: Six intraoperative cross-table lateral projections were obtained of the lumbar spine. The first image demonstrates surgical localization of the posterior elements of L1. The second image demonstrates surgical probe directed toward posterior elements of L2. Third image demonstrates surgical probe positioned over posterior elements of L2. Fourth image demonstrates 2 surgical probes directed for posterior elements of L2. Fifth image demonstrates surgical probe directed toward posterior elements of L2. The 6 image demonstrates surgical probe also seen projected over posterior elements of L2. IMPRESSION: Surgical localization as described above. Electronically Signed   By: Marijo Conception, M.D.   On: 01/04/2017 16:16   Dg Chest Port 1 View  Result Date: 01/22/2017 CLINICAL DATA:  Encounter for nasogastric tube placement. EXAM: PORTABLE CHEST 1 VIEW COMPARISON:  Radiograph of same day. FINDINGS: Stable cardiomediastinal silhouette. No pneumothorax or pleural effusion is noted. The lungs are clear. Bony thorax is unremarkable. Distal tip of nasogastric tube is coiled within the proximal stomach. IMPRESSION: Distal tip of nasogastric tube  is seen coiled within proximal  stomach. Electronically Signed   By: Marijo Conception, M.D.   On: 01/20/2017 16:54   Dg Chest Portable 1 View  Result Date: 01/10/2017 CLINICAL DATA:  NG tube placement. EXAM: PORTABLE CHEST 1 VIEW COMPARISON:  10/18/2013. FINDINGS: NG tube noted. Its tip appears coiled in the distal esophagus. Cardiomegaly with normal pulmonary vascularity. Low lung volumes. No pleural effusion or pneumothorax. Mild gastric distention . IMPRESSION: 1. NG tube noted, its tip appears coiled in the distal esophagus. Repositioning should be considered. Mild gastric distention. 2.  Low lung volumes with mild basilar atelectasis. Critical Value/emergent results were called by telephone at the time of interpretation on 12/31/2016 at 4:07 pm to Dr. Alvino Chapel, who verbally acknowledged these results. Electronically Signed   By: Marcello Moores  Register   On: 01/04/2017 16:10   Dg Abd Portable 1v  Result Date: 01/26/2017 CLINICAL DATA:  Small bowel obstruction, nasogastric tube placement EXAM: PORTABLE ABDOMEN - 1 VIEW COMPARISON:  Portable exam 1706 hours compared to 01/26/2017 at 0819 hours FINDINGS: Tube nasogastric tube projects over proximal stomach with proximal side-port potentially just above the gastroesophageal junction; recommend advancing tube 4 cm. Marked gas distention of small bowel loops up to 7.1 cm diameter. No definite bowel wall thickening. No significant colonic gas. Prior lumbar fusion. Diffuse osseous demineralization. IMPRESSION: Persistent small bowel dilatation/obstruction. Recommend advancing nasogastric tube 4 cm. Findings called to [place] on [address] on 01/26/2017 at 1738 hours. Electronically Signed   By: Lavonia Dana M.D.   On: 01/26/2017 17:39   Dg Abd Portable 2v  Result Date: 01/27/2017 CLINICAL DATA:  Followup small bowel obstruction. EXAM: PORTABLE ABDOMEN - 2 VIEW COMPARISON:  Yesterday. FINDINGS: Multiple dilated small bowel loops with a mild decrease in caliber. The maximum small bowel diameter is  currently 6.4 cm. No bowel wall thickening or pneumatosis seen. No free peritoneal air. Nasogastric tube tip and side hole in the proximal stomach. Post laminectomy changes and fixation hardware in the lumbar spine. Thoracolumbar spine degenerative changes and scoliosis. IMPRESSION: Mild improved small-bowel obstruction. Electronically Signed   By: Claudie Revering M.D.   On: 01/27/2017 07:56   Dg Abd Portable 2v  Result Date: 01/26/2017 CLINICAL DATA:  Abdominal pain. EXAM: PORTABLE ABDOMEN - 2 VIEW COMPARISON:  01/25/2017. FINDINGS: NG tube noted coiled in stomach. Persistent severely distended loops of small bowel are noted consistent small bowel obstruction. Findings consistent with persistent small-bowel obstruction. Some degree of air is noted colon. No free air is noted. Calcifications in the right upper quadrant consistent gallstones. Pelvic calcifications consistent phleboliths. Prior lumbar spine fusion. No free air. No free air. Lumbar spine fusion. IMPRESSION: 1. NG tube noted coiled stomach. Persistent severely distended loops of small bowel are again noted consistent small bowel obstruction. Some degree of air is noted in the colon No free air noted . 2.  Gallstones. Electronically Signed   By: Marcello Moores  Register   On: 01/26/2017 08:55   Dg Abd Portable 2v  Result Date: 01/25/2017 CLINICAL DATA:  Small bowel obstruction EXAM: PORTABLE ABDOMEN - 2 VIEW COMPARISON:  CT abdomen and pelvis January 24, 2017 FINDINGS: Supine and upright images obtained. Nasogastric tube tip and side port in stomach. There remains small bowel dilatation with air-fluid levels in a pattern suggesting persistent bowel obstruction. No evident free air. There is postoperative change in the lumbar spine. There are phleboliths in the pelvis. Is normal. There is no evidence of free air. No radio-opaque calculi or  other significant radiographic abnormality is seen. IMPRESSION: Persistence of small bowel obstruction. No free air  evident. Nasogastric tube tip and side port in stomach. Electronically Signed   By: Lowella Grip III M.D.   On: 01/25/2017 11:01    Time Spent in minutes  25   Bethena Roys M.D on 01/27/2017 at 5:08 PM  Between 7am to 7pm - Pager - [phone]  After 7pm go to [email or web address] - password [facility]  [facility] -  Office  [phone]

## 2017-01-28 DIAGNOSIS — K5652 Intestinal adhesions [bands] with complete obstruction: Secondary | ICD-10-CM

## 2017-01-28 LAB — BASIC METABOLIC PANEL
ANION GAP: 8 (ref 5–15)
BUN: 37 mg/dL — ABNORMAL HIGH (ref 6–20)
CHLORIDE: 105 mmol/L (ref 101–111)
CO2: 31 mmol/L (ref 22–32)
Calcium: 9 mg/dL (ref 8.9–10.3)
Creatinine, Ser: 1.07 mg/dL (ref 0.61–1.24)
GFR calc non Af Amer: >60 mL/min (ref >60)
GLUCOSE: 172 mg/dL — AB (ref 65–99)
Potassium: 4.1 mmol/L (ref 3.5–5.1)
Sodium: 144 mmol/L (ref 135–145)

## 2017-01-28 LAB — MAGNESIUM: Magnesium: 2.1 mg/dL (ref 1.7–2.4)

## 2017-01-28 NOTE — Progress Notes (Signed)
Called to the room by nurse tech who informed me that the pt had dislodged his NG tube when ambulating to the restroom. The tube had come out several inches. Suction was turned off and tube was able to be advanced back to the mark that it was originally inserted at. Inquired with Dr. Marin Comment about need for chest xray. He stated that there was no need for a chest xray, since the pt is not receiving any tube feedings or medications via tube. 10 ml air inserted into tube and auscultated in the abdomen. 20 ml bile aspirated from tube. Pt denies any discomfort. Pt stated that he felt like he had to have a bowel movement, but was unable to do anything. Pt resting comfortably in bed with NG tube hooked to intermittent low suction. Will continue to monitor. Ames Dura, BSN, RN 01/28/2017 4:38 AM

## 2017-01-28 NOTE — Progress Notes (Signed)
PROGRESS NOTE                                                                                                                                                                                                             Patient Demographics:    Joshua Downs, is a 83 y.o. male, DOB - June 13, 1931, WER:[id number]  Admit date - 01/16/2017   Admitting Physician Karmen Bongo, MD  Outpatient Primary MD for the patient is [name],[name], MD  LOS - 4  Outpatient Specialists NONE  Chief Complaint  Patient presents with  . Abdominal Pain       Brief Narrative   83 year old male with history of hypertension, remote left kidney carcinoma status post partial nephrectomy, chronic bradycardia, recent back surgery who presented with abdominal pain for past 3 days. Denied any fevers, chills, nausea or vomiting. Denied dysuria or diarrhea. Patient had back surgery 3 weeks prior to admission and was given narcotics with Dulcolax.  In the ED CT of the abdomen showed high-grade small bowel obstruction with transition point in the mid abdomen. Also noted for acute kidney injury on labs. NG tube placed and admitted to hospitalist service. Surgery consulted. NG tube initially removed- 3/30, to be reinserted same day due to recurrence of distension.   Subjective:   No bowel movements or flatus. signifcantly reduced amount of drainage from NG tube.   Assessment  & Plan :    Principal Problem:   Acute renal failure (ARF) ([place])- resolved. Back to baseline.  Prerenal with dehydration. Improving with IV fluids, cont IVF while NPO.  Active Problems:   SBO (small bowel obstruction)-  Per Surgical evaluation- likely attributable to postsurgical adhesions complicated by small to moderate amount of narcotics. - NG tube previously removed 3/30, reinserted due to recurrence of bowel distension - IV fluids - Surgery recommendations appreciated- monitor  ongoing bowel function on abdominal exam, ambulation encouraged - daily bmp, mag  Chronic bradycardia stable.  - Avoid rate limiting medications  Hypernatremia- improving. - Continue half-normal saline with D5, with 20 of KCl 100cc/hr  Hypertension- stable, Lisinopril HCTZ held due to nothing by mouth status - When necessary hydralazine 5 mg for blood pressure greater than 170/100  Protein-calorie malnutrition, severe  Hypertension Stable.  Code Status : Full code  Family Communication  : None at bedside  Disposition Plan  :  Pending resolution of abd distension, and tolerate advancement of diet.  Barriers For Discharge : Active symptoms  Consults  :  Surgery  Procedures  : CT abdomen  DVT Prophylaxis  :  Lovenox -   Lab Results  Component Value Date   PLT 253 01/25/2017    Antibiotics  :   Anti-infectives    None        Objective:   Vitals:   01/27/17 2123 01/28/17 0416 01/28/17 1010 01/28/17 1355  BP: (!) 156/72 139/83  139/77  Pulse: 78 85  60  Resp: 18 18  18   Temp: 98.7 F (37.1 C) 98.8 F (37.1 C)  98.6 F (37 C)  TempSrc: Oral Oral  Oral  SpO2: 96% 97% 96% 99%  Weight:      Height:        Wt Readings from Last 3 Encounters:  01/21/2017 73.2 kg (161 lb 6.4 oz)  12/30/2016 76.3 kg (168 lb 3.2 oz)  01/04/17 81.2 kg (179 lb)     Intake/Output Summary (Last 24 hours) at 01/28/17 1859 Last data filed at 01/28/17 1213  Gross per 24 hour  Intake          2368.34 ml  Output             2300 ml  Net            68.34 ml     Physical Exam  Gen: not in distress, NG tube in place, minimal drainage HEENT:  moist mucosa, supple neck Chest: clear b/l, no added sounds CVS: N S1&S2, no murmurs, GI: soft, improved abdominal distention but nontender, borborygmic bowel sounds present Musculoskeletal: warm, no edema   Data Review:    CBC  Recent Labs Lab 12/28/2016 1218 01/25/17 0536  WBC 5.9 4.3  HGB 14.9 13.3  HCT 43.3 39.6  PLT 296 253    MCV 85.4 85.9  MCH 29.4 28.9  MCHC 34.4 33.6  RDW 14.0 14.1    Chemistries   Recent Labs Lab 01/09/2017 1218 01/25/17 0536 01/26/17 0517 01/27/17 0604 01/28/17 0636  NA 135 137 141 146* 144  K 4.2 3.6 4.6 4.6 4.1  CL 92* 96* 105 107 105  CO2 28 29 25 27 31   GLUCOSE 146* 109* 98 122* 172*  BUN 74* 85* 73* 50* 37*  CREATININE 2.61* 2.26* 1.50* 1.14 1.07  CALCIUM 9.6 8.8* 8.7* 9.1 9.0  MG  --   --   --   --  2.1  AST 25  --   --   --   --   ALT 14*  --   --   --   --   ALKPHOS 79  --   --   --   --   BILITOT 1.5*  --   --   --   --    Inpatient Medications  Scheduled Meds: . diclofenac sodium  2 g Topical Daily  . LORazepam  0.5 mg Intravenous QHS   Continuous Infusions: . dextrose 5 % and 0.45 % NaCl with KCl 20 mEq/L 100 mL/hr at 01/28/17 1645   Radiology Reports Ct Abdomen Pelvis Wo Contrast  Result Date: 01/21/2017 CLINICAL DATA:  83 year old male with coffee ground emesis and abdominal pain. Nausea vomiting for 3 days. IMPRESSION: 1. Severe, high-grade small bowel obstruction. Abrupt transition point in the mid abdomen as seen on coronal image 35. Obstructing etiology uncertain but perhaps related to internal hernia. 2. No free air or free fluid. 3.  Calcified aortic atherosclerosis. 4. Chronic cholelithiasis. Electronically Signed   By: Genevie Ann M.D.   On: 01/07/2017 15:11   Dg Chest Port 1 View  Result Date: 01/27/2017 CLINICAL DATA:  Nasogastric tube placement.  Initial encounter. EXAM: PORTABLE CHEST 1 VIEW COMPARISON:  Chest radiograph performed 01/03/2017 FINDINGS: The patient's enteric tube is seen extending below the diaphragm. The lungs are well-aerated and clear. There is no evidence of focal opacification, pleural effusion or pneumothorax. The cardiomediastinal silhouette is within normal limits. No acute osseous abnormalities are seen. IMPRESSION: Enteric tube noted extending below the diaphragm. Electronically Signed   By: Garald Balding M.D.   On:  01/27/2017 19:00   Dg Abd Portable 2v  Result Date: 01/27/2017 CLINICAL DATA:  Followup small bowel obstruction. EXAM: PORTABLE ABDOMEN - 2 VIEW COMPARISON:  Yesterday. FINDINGS: Multiple dilated small bowel loops with a mild decrease in caliber. The maximum small bowel diameter is currently 6.4 cm. No bowel wall thickening or pneumatosis seen. No free peritoneal air. Nasogastric tube tip and side hole in the proximal stomach. Post laminectomy changes and fixation hardware in the lumbar spine. Thoracolumbar spine degenerative changes and scoliosis. IMPRESSION: Mild improved small-bowel obstruction. Electronically Signed   By: Claudie Revering M.D.   On: 01/27/2017 07:56    Bethena Roys M.D on 01/28/2017 at 6:59 PM  Between 7am to 7pm - Pager [phone]  After 7pm go to [email or web address] - password [facility]  [facility] -  Office  [phone]

## 2017-01-28 NOTE — Progress Notes (Signed)
SURGICAL PROGRESS NOTE (cpt [phone])  Hospital Day(s): 4.   Post op day(s):  Marland Kitchen   Interval History: Patient seen and examined, no acute events or new complaints overnight. NG tube was dislodged a few cm while patient was ambulating, but tube was easily able to be returned to its previous level of insertion without difficulty, and its position was clinically confirmed. Patient reports abdominal "rumbling" with only mild abdominal distention remaining and denies flatus, BM, CP, SOB, or fever/chills.  Review of Systems:  Constitutional: denies fever, chills  HEENT: denies cough or congestion  Respiratory: denies any shortness of breath  Cardiovascular: denies chest pain or palpitations  Gastrointestinal: abdominal pain, N/V, and bowel function as per interval history Genitourinary: denies burning with urination or urinary frequency Musculoskeletal: denies pain, decreased motor or sensation Integumentary: denies any other rashes or skin discolorations Neurological: denies HA or vision/hearing changes   Vital signs in last 24 hours: [min-max] current  Temp:  [98.6 F (37 C)-98.8 F (37.1 C)] 98.8 F (37.1 C) (04/01 0416) Pulse Rate:  [66-85] 85 (04/01 0416) Resp:  [18] 18 (04/01 0416) BP: (139-174)/(72-83) 139/83 (04/01 0416) SpO2:  [96 %-98 %] 97 % (04/01 0416)     Height: 6' (182.9 cm) Weight: 73.2 kg (161 lb 6.4 oz) BMI (Calculated): 21.9   Intake/Output this shift:  Total I/O In: -  Out: 600 [Emesis/NG output:600]   Intake/Output last 2 shifts:  @IOLAST2SHIFTS @   Physical Exam:  Constitutional: alert, cooperative and no distress  HENT: normocephalic without obvious abnormality  Eyes: PERRL, EOM's grossly intact and symmetric  Neuro: CN II - XII grossly intact and symmetric without deficit  Respiratory: breathing non-labored at rest  Cardiovascular: regular rate and sinus rhythm  Gastrointestinal: soft, non-tender, and only mildly distended  Musculoskeletal: UE and LE  FROM, no edema or wounds, motor and sensation grossly intact, NT   Labs:  CBC:  Lab Results  Component Value Date   WBC 4.3 01/25/2017   RBC 4.61 01/25/2017   BMP:  Lab Results  Component Value Date   GLUCOSE 172 (H) 01/28/2017   CO2 31 01/28/2017   BUN 37 (H) 01/28/2017   CREATININE 1.07 01/28/2017   CALCIUM 9.0 01/28/2017     Imaging studies: No new pertinent imaging studies   Assessment/Plan: (ICD-10's: K56.52) 83 y.o. malewith complete small bowel obstruction, likely attributable to post-surgical adhesions, complicated by small-moderate amount of narcotics for post-surgical pain s/p recent spine surgery and by pertinent comorbidities including HTN, chronic bradycardia with Right BBB, BPH, and osteoarthritis with chronic back pain.  - NPO, IVF - NG tube for nasogastric decompression - monitor ongoing bowel function and abdominal exam  - anticipate symptomatic relief within 24 - 48 hours following NGT insertion, followed by "rumbling" the following day and flatus either the same day or the day following the "rumbling" with anticipated length of stay ~3 - 5 days with successful non-operative management for 8 of 10 patients with small bowel obstruction secondary to adhesions - medical management as per medical team - ambulation encouraged - DVT prophylaxis  All of the above findings and recommendations were discussed with the patient, and all of patient's questions were answered to his expressed satisfaction.  -- Marilynne Drivers Rosana Hoes, MD, [place]: [facility] Office: [phone]

## 2017-01-28 DEATH — deceased

## 2017-01-29 DIAGNOSIS — K5652 Intestinal adhesions [bands] with complete obstruction: Secondary | ICD-10-CM

## 2017-01-29 LAB — BASIC METABOLIC PANEL
Anion gap: 6 (ref 5–15)
BUN: 26 mg/dL — ABNORMAL HIGH (ref 6–20)
CHLORIDE: 103 mmol/L (ref 101–111)
CO2: 32 mmol/L (ref 22–32)
CREATININE: 1.15 mg/dL (ref 0.61–1.24)
Calcium: 8.7 mg/dL — ABNORMAL LOW (ref 8.9–10.3)
GFR calc non Af Amer: 57 mL/min — ABNORMAL LOW (ref >60)
Glucose, Bld: 161 mg/dL — ABNORMAL HIGH (ref 65–99)
POTASSIUM: 4.1 mmol/L (ref 3.5–5.1)
SODIUM: 141 mmol/L (ref 135–145)

## 2017-01-29 LAB — GLUCOSE, CAPILLARY: GLUCOSE-CAPILLARY: 133 mg/dL — AB (ref 65–99)

## 2017-01-29 MED ORDER — PHENOL 1.4 % MT LIQD
1.0000 | OROMUCOSAL | Status: DC | PRN
  Administered 2017-01-29 – 2017-02-02 (×2): 1 via OROMUCOSAL
  Filled 2017-01-29: qty 177

## 2017-01-29 NOTE — Progress Notes (Signed)
PROGRESS NOTE                                                                                                                                                                                                             Patient Demographics:    Joshua Downs, is a 83 y.o. male, DOB - 28-May-1931, KLK:[id number]  Admit date - 12/31/2016   Admitting Physician Karmen Bongo, MD  Outpatient Primary MD for the patient is [name],[name], MD  LOS - 5  Outpatient Specialists NONE  Chief Complaint  Patient presents with  . Abdominal Pain       Brief Narrative   83 year old male with history of hypertension, remote left kidney carcinoma status post partial nephrectomy, chronic bradycardia, recent back surgery who presented with abdominal pain for past 3 days. Denied any fevers, chills, nausea or vomiting. Denied dysuria or diarrhea. Patient had back surgery 3 weeks prior to admission and was given narcotics with Dulcolax.  In the ED CT of the abdomen showed high-grade small bowel obstruction with transition point in the mid abdomen. Also noted for acute kidney injury on labs. NG tube placed and admitted to hospitalist service. Surgery consulted. NG tube initially removed- 3/30, to be reinserted same day due to recurrence of distension.   Subjective:   Still No bowel movements or flatus. signifcantly reduced amount of drainage from NG tube,    Assessment  & Plan :    Principal Problem:   Acute renal failure (ARF) ([place])- resolved. Back to baseline.  Prerenal with dehydration. Improving with IV fluids, cont IVF while NPO.  Active Problems:   SBO (small bowel obstruction)-  Per Surgical evaluation- likely attributable to postsurgical adhesions complicated by small to moderate amount of narcotics. - NG tube previously removed 3/30, reinserted due to recurrence of bowel distension - IV fluids - Surgery recommendations appreciated -  daily bmp, mag  Chronic bradycardia stable.  - Avoid rate limiting medications  Hypernatremia- improving. - Continue half-normal saline with D5, with 20 of KCl 100cc/hr  Hypertension- stable, Lisinopril HCTZ held due to nothing by mouth status - When necessary hydralazine 5 mg for blood pressure greater than 170/100  Protein-calorie malnutrition, severe  Hypertension Stable.  Code Status : Full code  Family Communication  : None at bedside  Disposition Plan  : Pending resolution of abd distension,  and tolerate advancement of diet.  Barriers For Discharge : Active symptoms  Consults  :  Surgery  Procedures  : CT abdomen  DVT Prophylaxis  :  Lovenox -   Lab Results  Component Value Date   PLT 253 01/25/2017    Antibiotics  :   Anti-infectives    None        Objective:   Vitals:   01/28/17 1355 01/28/17 2156 01/29/17 0619 01/29/17 1335  BP: 139/77 135/73 136/86 130/79  Pulse: 60 87 91 86  Resp: 18 18 18 20   Temp: 98.6 F (37 C) 99.5 F (37.5 C) 98.4 F (36.9 C) 98 F (36.7 C)  TempSrc: Oral Oral Oral Oral  SpO2: 99% 96% 98% 97%  Weight:      Height:        Wt Readings from Last 3 Encounters:  12/28/2016 73.2 kg (161 lb 6.4 oz)  01/20/2017 76.3 kg (168 lb 3.2 oz)  01/04/17 81.2 kg (179 lb)     Intake/Output Summary (Last 24 hours) at 01/29/17 1817 Last data filed at 01/29/17 0911  Gross per 24 hour  Intake          1778.33 ml  Output             2500 ml  Net          -721.67 ml     Physical Exam  Gen: not in distress, NG tube in place, minimal drainage HEENT:  moist mucosa, supple neck Chest: clear b/l, no added sounds CVS: N S1&S2, no murmurs, GI: soft, abdominal distention, But not tender, improved compared to prior, Bowel sounds present. Musculoskeletal: warm, no edema   Data Review:    CBC  Recent Labs Lab 01/02/2017 1218 01/25/17 0536  WBC 5.9 4.3  HGB 14.9 13.3  HCT 43.3 39.6  PLT 296 253  MCV 85.4 85.9  MCH 29.4 28.9    MCHC 34.4 33.6  RDW 14.0 14.1    Chemistries   Recent Labs Lab 01/13/2017 1218 01/25/17 0536 01/26/17 0517 01/27/17 0604 01/28/17 0636 01/29/17 0443  NA 135 137 141 146* 144 141  K 4.2 3.6 4.6 4.6 4.1 4.1  CL 92* 96* 105 107 105 103  CO2 28 29 25 27 31  32  GLUCOSE 146* 109* 98 122* 172* 161*  BUN 74* 85* 73* 50* 37* 26*  CREATININE 2.61* 2.26* 1.50* 1.14 1.07 1.15  CALCIUM 9.6 8.8* 8.7* 9.1 9.0 8.7*  MG  --   --   --   --  2.1  --   AST 25  --   --   --   --   --   ALT 14*  --   --   --   --   --   ALKPHOS 79  --   --   --   --   --   BILITOT 1.5*  --   --   --   --   --    Inpatient Medications  Scheduled Meds: . diclofenac sodium  2 g Topical Daily  . LORazepam  0.5 mg Intravenous QHS   Continuous Infusions: . dextrose 5 % and 0.45 % NaCl with KCl 20 mEq/L 100 mL/hr at 01/29/17 1302   Radiology Reports Ct Abdomen Pelvis Wo Contrast  Result Date: 01/13/2017 CLINICAL DATA:  83 year old male with coffee ground emesis and abdominal pain. Nausea vomiting for 3 days. IMPRESSION: 1. Severe, high-grade small bowel obstruction. Abrupt transition point in the mid abdomen  as seen on coronal image 35. Obstructing etiology uncertain but perhaps related to internal hernia. 2. No free air or free fluid. 3.  Calcified aortic atherosclerosis. 4. Chronic cholelithiasis. Electronically Signed   By: Genevie Ann M.D.   On: 01/16/2017 15:11   Dg Chest Port 1 View  Result Date: 01/27/2017 CLINICAL DATA:  Nasogastric tube placement.  Initial encounter. EXAM: PORTABLE CHEST 1 VIEW COMPARISON:  Chest radiograph performed 01/18/2017 FINDINGS: The patient's enteric tube is seen extending below the diaphragm. The lungs are well-aerated and clear. There is no evidence of focal opacification, pleural effusion or pneumothorax. The cardiomediastinal silhouette is within normal limits. No acute osseous abnormalities are seen. IMPRESSION: Enteric tube noted extending below the diaphragm. Electronically Signed    By: Garald Balding M.D.   On: 01/27/2017 19:00   Dg Abd Portable 2v  Result Date: 01/27/2017 CLINICAL DATA:  Followup small bowel obstruction. EXAM: PORTABLE ABDOMEN - 2 VIEW COMPARISON:  Yesterday. FINDINGS: Multiple dilated small bowel loops with a mild decrease in caliber. The maximum small bowel diameter is currently 6.4 cm. No bowel wall thickening or pneumatosis seen. No free peritoneal air. Nasogastric tube tip and side hole in the proximal stomach. Post laminectomy changes and fixation hardware in the lumbar spine. Thoracolumbar spine degenerative changes and scoliosis. IMPRESSION: Mild improved small-bowel obstruction. Electronically Signed   By: Claudie Revering M.D.   On: 01/27/2017 07:56    Bethena Roys M.D on 01/29/2017 at 6:17 PM  Between 7am to 7pm - Pager - [phone]  After 7pm go to [email or web address] - password [facility]  [facility] -  Office  [phone]

## 2017-01-29 NOTE — Progress Notes (Signed)
SURGICAL PROGRESS NOTE (cpt [phone])  Hospital Day(s): 5.   Post op day(s):  Marland Kitchen   Interval History: Patient seen and examined, no acute events or new complaints overnight. Patient reports continued abdominal "rumbling" and passage of small amount of clear liquid per rectum with noticeably decreased (but not entirely resolved) abdominal distention and no flatus. Patient otherwise denies abdominal pain, N/V, fever/chills, CP, or SOB and has been ambulating in the halls ~2-3x/daily.  Review of Systems:  Constitutional: denies fever, chills  HEENT: denies cough or congestion  Respiratory: denies any shortness of breath  Cardiovascular: denies chest pain or palpitations  Gastrointestinal: abdominal pain, N/V, and bowel function as per interval history Genitourinary: denies burning with urination or urinary frequency Musculoskeletal: denies pain, decreased motor or sensation Integumentary: denies any other rashes or skin discolorations Neurological: denies HA or vision/hearing changes   Vital signs in last 24 hours: [min-max] current  Temp:  [98 F (36.7 C)-99.5 F (37.5 C)] 98 F (36.7 C) (04/02 1335) Pulse Rate:  [86-91] 86 (04/02 1335) Resp:  [18-20] 20 (04/02 1335) BP: (130-136)/(73-86) 130/79 (04/02 1335) SpO2:  [96 %-98 %] 97 % (04/02 1335)     Height: 6' (182.9 cm) Weight: 73.2 kg (161 lb 6.4 oz) BMI (Calculated): 21.9   Intake/Output this shift:  Total I/O In: -  Out: 150 [Urine:150]   Intake/Output last 2 shifts:  @IOLAST2SHIFTS @   Physical Exam:  Constitutional: alert, cooperative and no distress  HENT: normocephalic without obvious abnormality  Eyes: PERRL, EOM's grossly intact and symmetric  Neuro: CN II - XII grossly intact and symmetric without deficit  Respiratory: breathing non-labored at rest  Cardiovascular: regular rate and sinus rhythm  Gastrointestinal: soft, non-tender, and minimal-/mild abdominal distention in comparison to own initial exam   Musculoskeletal: UE and LE FROM, no edema or wounds, motor and sensation grossly intact, NT   Labs:  CBC:  Lab Results  Component Value Date   WBC 4.3 01/25/2017   RBC 4.61 01/25/2017   BMP:  Lab Results  Component Value Date   GLUCOSE 161 (H) 01/29/2017   CO2 32 01/29/2017   BUN 26 (H) 01/29/2017   CREATININE 1.15 01/29/2017   CALCIUM 8.7 (L) 01/29/2017     Imaging studies: No new pertinent imaging studies   Assessment/Plan: (ICD-10's: K56.52) 83 y.o.malewith complete small bowel obstruction, likely attributable to post-surgical adhesions, complicated by small-moderate amount of narcotics for post-surgical pain s/p recent spine surgery and bypertinent comorbidities including HTN, chronic bradycardia with Right BBB, BPH, and osteoarthritis with chronic back pain.  - NPO, IVF - NG tube for nasogastric decompression - monitor ongoing bowel function and abdominal exam  - remove NG tube and start clear liquids when patient passing flatus  - if no flatus by tomorrow, will need to consider TPN + operative relieve of obstruction (laparotomy with lysis of adhesions) - anticipate symptomatic relief within 24 - 48 hours following NGT insertion, followed by "rumbling" the following day and flatus either the same day or the day following the "rumbling" with anticipated length of stay ~3 - 5 days with successful non-operative management for 8 of 10 patients with small bowel obstruction secondary to adhesions - medical management as per medical team  - please call if any questions - ambulation encouraged - DVT prophylaxis  All of the above findings and recommendations were discussed with the patient, and all of patient's questions were answered to his expressed satisfaction.  -- Marilynne Drivers Rosana Hoes, MD, [place]: [facility] Office:  [phone]

## 2017-01-30 DIAGNOSIS — K5652 Intestinal adhesions [bands] with complete obstruction: Secondary | ICD-10-CM

## 2017-01-30 LAB — BASIC METABOLIC PANEL
ANION GAP: 6 (ref 5–15)
BUN: 27 mg/dL — ABNORMAL HIGH (ref 6–20)
CALCIUM: 8.6 mg/dL — AB (ref 8.9–10.3)
CO2: 32 mmol/L (ref 22–32)
Chloride: 101 mmol/L (ref 101–111)
Creatinine, Ser: 1.29 mg/dL — ABNORMAL HIGH (ref 0.61–1.24)
GFR, EST AFRICAN AMERICAN: 57 mL/min — AB (ref >60)
GFR, EST NON AFRICAN AMERICAN: 49 mL/min — AB (ref >60)
Glucose, Bld: 155 mg/dL — ABNORMAL HIGH (ref 65–99)
Potassium: 4.1 mmol/L (ref 3.5–5.1)
SODIUM: 139 mmol/L (ref 135–145)

## 2017-01-30 LAB — MAGNESIUM: MAGNESIUM: 1.9 mg/dL (ref 1.7–2.4)

## 2017-01-30 MED ORDER — BOOST / RESOURCE BREEZE PO LIQD: 1.0000 | Freq: Two times a day (BID) | ORAL | Status: DC

## 2017-01-30 MED ORDER — PRO-STAT SUGAR FREE PO LIQD
30.0000 mL | Freq: Two times a day (BID) | ORAL | Status: DC
  Administered 2017-01-30: 30 mL via ORAL
  Filled 2017-01-30: qty 30

## 2017-01-30 MED ORDER — ENOXAPARIN SODIUM 40 MG/0.4ML ~~LOC~~ SOLN
40.0000 mg | SUBCUTANEOUS | Status: DC
  Administered 2017-01-30 – 2017-01-31 (×2): 40 mg via SUBCUTANEOUS
  Filled 2017-01-30 (×2): qty 0.4

## 2017-01-30 NOTE — Care Management Note (Signed)
Case Management Note  Patient Details  Name: Joshua Downs MRN: [id number] Date of Birth: 1931/01/24  If discussed at [place] Length of Stay Meetings, dates discussed:  01/30/2017  Sherald Barge, RN 01/30/2017, 2:07 PM

## 2017-01-30 NOTE — Progress Notes (Addendum)
PROGRESS NOTE                                                                                                                                                                                                             Patient Demographics:    Joshua Downs, is a 83 y.o. male, DOB - 1930-11-23, KCL:[id number]  Admit date - 01/05/2017   Admitting Physician Karmen Bongo, MD  Outpatient Primary MD for the patient is [name],[name], MD  LOS - 6  Outpatient Specialists NONE  Chief Complaint  Patient presents with  . Abdominal Pain       Brief Narrative   83 year old male with history of hypertension, remote left kidney carcinoma status post partial nephrectomy, chronic bradycardia, recent back surgery who presented with abdominal pain for past 3 days. Denied any fevers, chills, nausea or vomiting. Denied dysuria or diarrhea. Patient had back surgery 3 weeks prior to admission and was given narcotics with Dulcolax.  In the ED CT of the abdomen showed high-grade small bowel obstruction with transition point in the mid abdomen. Also noted for acute kidney injury on labs. NG tube placed and admitted to hospitalist service. Surgery consulted. NG tube initially removed- 3/30, to be reinserted same day due to recurrence of distension.   Subjective:   When I saw patient this morning he said he had normoactive bowel movements or passed flatus. But it appears later he indeed, by the time he was evaluated by surgery. No abdominal pain no chest pain or shortness of breath.   Assessment  & Plan :    Principal Problem:   Acute renal failure (ARF) ([place])- resolved.  Prerenal with dehydration. Improving with IV fluids, cont IVF Until by mouth intake is advanced.  Active Problems:   SBO (small bowel obstruction)-  Per Surgical evaluation- likely attributable to postsurgical adhesions complicated by small to moderate amount of narcotics. NG  tube removed today 4/3, as patient passed flatus. - NG tube previously removed 3/30, reinserted due to recurrence of bowel distension - IV fluids - Surgery recommendations appreciated - daily bmp, mag - Resumed DVT prophylaxis- lovenox, doubt need for surgery - PT eval.  Chronic bradycardia stable.  - Avoid rate limiting medications  Hypernatremia- improving. - Continue half-normal saline with D5, with 20 of KCl 100cc/hr  Hypertension- stable, Lisinopril HCTZ held due to  nothing by mouth status - When necessary hydralazine 5 mg for blood pressure greater than 170/100  Protein-calorie malnutrition, severe  Hypertension Stable.  Code Status : Full code  Family Communication  : None at bedside  Disposition Plan  : Pending advancement of diet, and tolerating it. PT eval.  Barriers For Discharge : Active symptoms  Consults  :  Surgery  Procedures  : CT abdomen  DVT Prophylaxis  :  Lovenox -   Lab Results  Component Value Date   PLT 253 01/25/2017    Antibiotics  :   Anti-infectives    None        Objective:   Vitals:   01/29/17 1335 01/29/17 2100 01/30/17 0547 01/30/17 0915  BP: 130/79 138/75 107/67   Pulse: 86 83 (!) 105   Resp: 20 20 18    Temp: 98 F (36.7 C) 97.9 F (36.6 C) 98.1 F (36.7 C)   TempSrc: Oral Oral Oral   SpO2: 97% 98% 95% 96%  Weight:      Height:        Wt Readings from Last 3 Encounters:  01/23/2017 73.2 kg (161 lb 6.4 oz)  01/02/2017 76.3 kg (168 lb 3.2 oz)  01/04/17 81.2 kg (179 lb)     Intake/Output Summary (Last 24 hours) at 01/30/17 1238 Last data filed at 01/30/17 1610  Gross per 24 hour  Intake                0 ml  Output             3250 ml  Net            -3250 ml     Physical Exam  Gen: not in distress, NG tube in place- later removed, HEENT:  moist mucosa, supple neck Chest: clear b/l, no added sounds CVS: N S1&S2, no murmurs, GI: soft, abdominal soft, full, but not distended, not tender, improved compared to  prior, Bowel sounds present. Musculoskeletal: warm, no edema   Data Review:    CBC  Recent Labs Lab 01/02/2017 1218 01/25/17 0536  WBC 5.9 4.3  HGB 14.9 13.3  HCT 43.3 39.6  PLT 296 253  MCV 85.4 85.9  MCH 29.4 28.9  MCHC 34.4 33.6  RDW 14.0 14.1    Chemistries   Recent Labs Lab 01/06/2017 1218  01/26/17 0517 01/27/17 0604 01/28/17 0636 01/29/17 0443 01/30/17 0553  NA 135  < > 141 146* 144 141 139  K 4.2  < > 4.6 4.6 4.1 4.1 4.1  CL 92*  < > 105 107 105 103 101  CO2 28  < > 25 27 31  32 32  GLUCOSE 146*  < > 98 122* 172* 161* 155*  BUN 74*  < > 73* 50* 37* 26* 27*  CREATININE 2.61*  < > 1.50* 1.14 1.07 1.15 1.29*  CALCIUM 9.6  < > 8.7* 9.1 9.0 8.7* 8.6*  MG  --   --   --   --  2.1  --  1.9  AST 25  --   --   --   --   --   --   ALT 14*  --   --   --   --   --   --   ALKPHOS 79  --   --   --   --   --   --   BILITOT 1.5*  --   --   --   --   --   --   < > =  values in this interval not displayed. Inpatient Medications  Scheduled Meds: . diclofenac sodium  2 g Topical Daily  . LORazepam  0.5 mg Intravenous QHS   Continuous Infusions: . dextrose 5 % and 0.45 % NaCl with KCl 20 mEq/L 100 mL/hr at 01/30/17 1016   Radiology Reports Ct Abdomen Pelvis Wo Contrast  Result Date: 01/04/2017 CLINICAL DATA:  83 year old male with coffee ground emesis and abdominal pain. Nausea vomiting for 3 days. IMPRESSION: 1. Severe, high-grade small bowel obstruction. Abrupt transition point in the mid abdomen as seen on coronal image 35. Obstructing etiology uncertain but perhaps related to internal hernia. 2. No free air or free fluid. 3.  Calcified aortic atherosclerosis. 4. Chronic cholelithiasis. Electronically Signed   By: Genevie Ann M.D.   On: 01/12/2017 15:11   Dg Chest Port 1 View  Result Date: 01/27/2017 CLINICAL DATA:  Nasogastric tube placement.  Initial encounter. EXAM: PORTABLE CHEST 1 VIEW COMPARISON:  Chest radiograph performed 01/27/2017 FINDINGS: The patient's enteric  tube is seen extending below the diaphragm. The lungs are well-aerated and clear. There is no evidence of focal opacification, pleural effusion or pneumothorax. The cardiomediastinal silhouette is within normal limits. No acute osseous abnormalities are seen. IMPRESSION: Enteric tube noted extending below the diaphragm. Electronically Signed   By: Garald Balding M.D.   On: 01/27/2017 19:00   Dg Abd Portable 2v  Result Date: 01/27/2017 CLINICAL DATA:  Followup small bowel obstruction. EXAM: PORTABLE ABDOMEN - 2 VIEW COMPARISON:  Yesterday. FINDINGS: Multiple dilated small bowel loops with a mild decrease in caliber. The maximum small bowel diameter is currently 6.4 cm. No bowel wall thickening or pneumatosis seen. No free peritoneal air. Nasogastric tube tip and side hole in the proximal stomach. Post laminectomy changes and fixation hardware in the lumbar spine. Thoracolumbar spine degenerative changes and scoliosis. IMPRESSION: Mild improved small-bowel obstruction. Electronically Signed   By: Claudie Revering M.D.   On: 01/27/2017 07:56    Bethena Roys M.D on 01/30/2017 at 12:38 PM  Between 7am to 7pm - Pager - [phone]  After 7pm go to [email or web address] - password [facility]  [facility] -  Office  [phone]

## 2017-01-30 NOTE — Progress Notes (Signed)
SURGICAL PROGRESS NOTE (cpt [phone])  Hospital Day(s): 6.   Post op day(s):  Marland Kitchen   Interval History: Patient seen and examined, no acute events or new complaints overnight. Patient reports his abdomen feels completely flat and at his usual baseline this morning and states that after going for a walk, he has passed flatus once while walking and again while sitting in a chair. He denies any abdominal pain or N/V and otherwise denies CP, SOB, or fever/chills.  Review of Systems:  Constitutional: denies fever, chills  HEENT: denies cough or congestion  Respiratory: denies any shortness of breath  Cardiovascular: denies chest pain or palpitations  Gastrointestinal: abdominal pain, N/V, and bowel function as per interval history Genitourinary: denies burning with urination or urinary frequency Musculoskeletal: denies pain, decreased motor or sensation Integumentary: denies any other rashes or skin discolorations Neurological: denies HA or vision/hearing changes   Vital signs in last 24 hours: [min-max] current  Temp:  [97.9 F (36.6 C)-98.1 F (36.7 C)] 98.1 F (36.7 C) (04/03 0547) Pulse Rate:  [83-105] 105 (04/03 0547) Resp:  [18-20] 18 (04/03 0547) BP: (107-138)/(67-79) 107/67 (04/03 0547) SpO2:  [95 %-98 %] 95 % (04/03 0547)     Height: 6' (182.9 cm) Weight: 73.2 kg (161 lb 6.4 oz) BMI (Calculated): 21.9   Intake/Output this shift:  Total I/O In: -  Out: 150 [Urine:150]   Intake/Output last 2 shifts:  @IOLAST2SHIFTS @   Physical Exam:  Constitutional: alert, cooperative and no distress  HENT: normocephalic without obvious abnormality  Eyes: PERRL, EOM's grossly intact and symmetric  Neuro: CN II - XII grossly intact and symmetric without deficit  Respiratory: breathing non-labored at rest  Cardiovascular: regular rate and sinus rhythm  Gastrointestinal: soft, completely non-tender, and now completely non-distended  Musculoskeletal: UE and LE FROM, no edema or wounds, motor  and sensation grossly intact, NT   Labs:  CBC Latest Ref Rng & Units 01/25/2017 12/28/2016 01/03/2017  WBC 4.0 - 10.5 K/uL 4.3 5.9 4.3  Hemoglobin 13.0 - 17.0 g/dL 13.3 14.9 13.7  Hematocrit 39.0 - 52.0 % 39.6 43.3 42.4  Platelets 150 - 400 K/uL 253 296 285   BMP Latest Ref Rng & Units 01/30/2017 01/29/2017 01/28/2017  Glucose 65 - 99 mg/dL 155(H) 161(H) 172(H)  BUN 6 - 20 mg/dL 27(H) 26(H) 37(H)  Creatinine 0.61 - 1.24 mg/dL 1.29(H) 1.15 1.07  Sodium 135 - 145 mmol/L 139 141 144  Potassium 3.5 - 5.1 mmol/L 4.1 4.1 4.1  Chloride 101 - 111 mmol/L 101 103 105  CO2 22 - 32 mmol/L 32 32 31  Calcium 8.9 - 10.3 mg/dL 8.6(L) 8.7(L) 9.0    Imaging studies: No new pertinent imaging studies  Assessment/Plan: (ICD-10's: K56.52) 83 y.o.malewith resolving complete small bowel obstruction, likely attributable to post-surgical adhesions, complicated by small-moderate amount of narcotics for post-surgical pain s/p recent spine surgery and bypertinent comorbidities including HTN, chronic bradycardia with Right BBB, BPH, and osteoarthritis with chronic back pain.  - discontinue NG tube, start clear liquids diet (ordered) - continue to monitor ongoing bowel function and abdominal exam - medical management as per medical team             - please call if any questions - ambulation encouraged - DVT prophylaxis  All of the above findings and recommendations were discussed with the patient and his family, and all of patient's and his family's questions were answered to theirexpressed satisfaction.  -- Marilynne Drivers Rosana Hoes, MD, [place]: [facility]  General Surgery and Vascular Care Office: [phone]

## 2017-01-30 NOTE — Progress Notes (Signed)
Nutrition Follow-up  DOCUMENTATION CODES:  Severe malnutrition in context of acute illness/injury  INTERVENTION:  Pt has gone approximately 10 days with minimal to no nutrition. At this time, TPN is warranted if patient shows intolerance to clear liquids, as he on advancement before.   Boost Breeze po BID, each supplement provides 250 kcal and 9 grams of protein  Will order 30 mL Prostat BID, each supplement provides 100 kcal and 15 grams of protein.  NUTRITION DIAGNOSIS:  Malnutrition (Severe) related to inability to eat, nausea, vomiting (caused by SBO) as evidenced by percent weight loss, energy intake < or equal to 50% for > or equal to 5 days.  Ongoing,  GOAL:  Patient will meet greater than or equal to 90% of their needs  MONITOR:  Diet advancement, Labs, Weight trends  REASON FOR ASSESSMENT:  Malnutrition Screening Tool    ASSESSMENT:  83 y/o male PMHx HTN, Remote L kidney cancer s/p partial nephrectomy. Had recent elective back surgery 3/8. Developed abdominal pain with distension Saturday-Sunday, followed by n/v and poor appetite. Ct shows high grade SBO and AKI . admitted for management.   Interval Hx:  3/30: Diet advanced to Clear liquids   , but later in afternoon, showed distension and no flatus-> NGT reinserted, NPO    Patient seen early this morning, at which time he denied any flatus/BM. His abdomen was no longer distended. Has 400 cc out in canister. Denied any nausea.   Patient then took walk and came back and reported passing flatus. His NGT subsequently removed and diet progressed to Clear liquids. Will order Prostat and Boost to supplement intake.   Pt reports that he was at his baseline until Morning of 3/25, which entailed eating  >3 meals of a regular diet a day with additional snacks. Therefore, was well nourished prior to acute illness/complications. ASPEN recs 7-10 days before initiation of nutrition support in well nourished patient.   Meds:  Ativan, D5 IVF, providing 17 kcals/hr, 408 kcals/day Labs: BG: 150-170   Recent Labs Lab 01/28/17 0636 01/29/17 0443 01/30/17 0553  NA 144 141 139  K 4.1 4.1 4.1  CL 105 103 101  CO2 31 32 32  BUN 37* 26* 27*  CREATININE 1.07 1.15 1.29*  CALCIUM 9.0 8.7* 8.6*  MG 2.1  --  1.9  GLUCOSE 172* 161* 155*    Diet Order:  Diet clear liquid Room service appropriate? Yes; Fluid consistency: Thin  Skin:  Reviewed, no issues  Last BM:  3/29-small  Height:  Ht Readings from Last 1 Encounters:  01/19/2017 6' (1.829 m)   Weight:  Wt Readings from Last 1 Encounters:  01/19/2017 161 lb 6.4 oz (73.2 kg)   Wt Readings from Last 10 Encounters:  01/09/2017 161 lb 6.4 oz (73.2 kg)  01/23/2017 168 lb 3.2 oz (76.3 kg)  01/04/17 179 lb (81.2 kg)  01/03/17 179 lb 1.6 oz (81.2 kg)  07/06/16 182 lb (82.6 kg)  05/23/16 181 lb 9.6 oz (82.4 kg)  04/13/15 179 lb (81.2 kg)  02/18/15 182 lb (82.6 kg)  12/18/13 182 lb 12.2 oz (82.9 kg)  10/18/13 195 lb (88.5 kg)   Ideal Body Weight:  80.91 kg  BMI:  Body mass index is 21.89 kg/m.  Estimated Nutritional Needs:  Kcal:  2050-2250 (28-31 kcal/kg bw) Protein:  88-102 g Pro (1.2-1.4 g/kg bw) Fluid:  >1.8 L (25 ml/kg bw)  EDUCATION NEEDS:  No education needs identified at this time  Burtis Junes RD, LDN, [place]  Clinical Nutrition Pager: [phone] 01/30/2017 1:19 PM

## 2017-01-31 ENCOUNTER — Inpatient Hospital Stay (HOSPITAL_COMMUNITY): Payer: Medicare Other

## 2017-01-31 DIAGNOSIS — K5652 Intestinal adhesions [bands] with complete obstruction: Secondary | ICD-10-CM

## 2017-01-31 MED ORDER — CHLORHEXIDINE GLUCONATE CLOTH 2 % EX PADS
6.0000 | MEDICATED_PAD | Freq: Once | CUTANEOUS | Status: AC
Start: 2017-01-31 — End: 2017-02-01
  Administered 2017-02-01: 6 via TOPICAL

## 2017-01-31 MED ORDER — CHLORHEXIDINE GLUCONATE CLOTH 2 % EX PADS
6.0000 | MEDICATED_PAD | Freq: Once | CUTANEOUS | Status: AC
  Administered 2017-01-31: 6 via TOPICAL

## 2017-01-31 MED ORDER — IOPAMIDOL (ISOVUE-300) INJECTION 61%
INTRAVENOUS | Status: AC
  Filled 2017-01-31: qty 30

## 2017-01-31 MED ORDER — FAT EMULSION 20 % IV EMUL
250.0000 mL | INTRAVENOUS | Status: AC
  Administered 2017-01-31: 250 mL via INTRAVENOUS
  Filled 2017-01-31: qty 250

## 2017-01-31 MED ORDER — IOPAMIDOL (ISOVUE-300) INJECTION 61%
100.0000 mL | Freq: Once | INTRAVENOUS | Status: AC | PRN
  Administered 2017-01-31: 100 mL via INTRAVENOUS

## 2017-01-31 MED ORDER — KCL IN DEXTROSE-NACL 20-5-0.45 MEQ/L-%-% IV SOLN
INTRAVENOUS | Status: DC
  Administered 2017-01-31 – 2017-02-01 (×2): via INTRAVENOUS

## 2017-01-31 MED ORDER — M.V.I. ADULT IV INJ
INJECTION | INTRAVENOUS | Status: AC
  Administered 2017-01-31: 20:00:00 via INTRAVENOUS
  Filled 2017-01-31: qty 960

## 2017-01-31 NOTE — Progress Notes (Signed)
PROGRESS NOTE    Joshua Downs  EHM:[id number] DOB: [birth year] DOA: 01/09/2017 PCP: Deloria Lair, MD     Brief Narrative:  83 year old man initially admitted to the hospital on 3/28 due to abdominal pain and found to have a small bowel obstruction. CT scan of the abdomen showed a high-grade small bowel obstruction with transition point in the mid abdomen. Was also noted to have acute kidney injury. NG tube was placed in the emergency department, surgical services were consulted. NG tube was initially removed on 3/30 and was reinserted on the same day due to recurrence of distention. NG tube was again removed on 4/3 due to patient's symptoms improving, unfortunately he has failed again with increased vomiting and NG tube has been replaced on 4/4 with plans to go to the OR on 4/5 for operative management.   Assessment & Plan:   Principal Problem:   Small bowel obstruction Active Problems:   Acute renal failure (ARF) (HCC)   Hyperglycemia   Protein-calorie malnutrition, severe   Small bowel obstruction -He has failed conservative management and will proceed with surgical exploration on 4/5. -CT scan of the abdomen requested today by Dr. Rosana Hoes.  Severe protein caloric malnutrition -Given prolonged nothing by mouth state, plan is to place PICC line and start TPN.  Hypernatremia -Resolved.  Hypertension -By mouth meds on hold given nothing by mouth state. -BP has been well controlled.   DVT prophylaxis: Lovenox Code Status: Full code Family Communication: Patient only Disposition Plan: To or on 4/5  Consultants:   Surgery, Dr. Rosana Hoes  Procedures:   None  Antimicrobials:  Anti-infectives    None       Subjective: Increased abdominal distention, multiple episodes of emesis overnight  Objective: Vitals:   01/30/17 1428 01/31/17 0000 01/31/17 0640 01/31/17 1440  BP: 107/66 103/62 126/77 106/76  Pulse: 100 (!) 105 92 (!) 118  Resp: 20 18 20 20   Temp: 98.4 F  (36.9 C) 98 F (36.7 C) 98.1 F (36.7 C) 97.6 F (36.4 C)  TempSrc: Oral Oral Oral Oral  SpO2: 100% 94% 96% 96%  Weight:      Height:        Intake/Output Summary (Last 24 hours) at 01/31/17 1702 Last data filed at 01/31/17 0900  Gross per 24 hour  Intake          5434.99 ml  Output             1400 ml  Net          4034.99 ml   Filed Weights   01/09/2017 1204 01/17/2017 1807  Weight: 76.2 kg (168 lb) 73.2 kg (161 lb 6.4 oz)    Examination:  General exam: Alert, awake, oriented x 3 Respiratory system: Clear to auscultation. Respiratory effort normal. Cardiovascular system:RRR. No murmurs, rubs, gallops. Gastrointestinal system: Abdomen is distended, hypoactive bowel sounds heard. Central nervous system: Alert and oriented. No focal neurological deficits. Extremities: No C/C/E, +pedal pulses Skin: No rashes, lesions or ulcers Psychiatry: Judgement and insight appear normal. Mood & affect appropriate.     Data Reviewed: I have personally reviewed following labs and imaging studies  CBC:  Recent Labs Lab 01/25/17 0536  WBC 4.3  HGB 13.3  HCT 39.6  MCV 85.9  PLT 366   Basic Metabolic Panel:  Recent Labs Lab 01/26/17 0517 01/27/17 0604 01/28/17 0636 01/29/17 0443 01/30/17 0553  NA 141 146* 144 141 139  K 4.6 4.6 4.1 4.1 4.1  CL 105 107 105  103 101  CO2 25 27 31  32 32  GLUCOSE 98 122* 172* 161* 155*  BUN 73* 50* 37* 26* 27*  CREATININE 1.50* 1.14 1.07 1.15 1.29*  CALCIUM 8.7* 9.1 9.0 8.7* 8.6*  MG  --   --  2.1  --  1.9   GFR: Estimated Creatinine Clearance: 44.1 mL/min (A) (by C-G formula based on SCr of 1.29 mg/dL (H)). Liver Function Tests: No results for input(s): AST, ALT, ALKPHOS, BILITOT, PROT, ALBUMIN in the last 168 hours. No results for input(s): LIPASE, AMYLASE in the last 168 hours. No results for input(s): AMMONIA in the last 168 hours. Coagulation Profile: No results for input(s): INR, PROTIME in the last 168 hours. Cardiac Enzymes: No  results for input(s): CKTOTAL, CKMB, CKMBINDEX, TROPONINI in the last 168 hours. BNP (last 3 results) No results for input(s): PROBNP in the last 8760 hours. HbA1C: No results for input(s): HGBA1C in the last 72 hours. CBG:  Recent Labs Lab 01/29/17 1634  GLUCAP 133*   Lipid Profile: No results for input(s): CHOL, HDL, LDLCALC, TRIG, CHOLHDL, LDLDIRECT in the last 72 hours. Thyroid Function Tests: No results for input(s): TSH, T4TOTAL, FREET4, T3FREE, THYROIDAB in the last 72 hours. Anemia Panel: No results for input(s): VITAMINB12, FOLATE, FERRITIN, TIBC, IRON, RETICCTPCT in the last 72 hours. Urine analysis: No results found for: COLORURINE, APPEARANCEUR, LABSPEC, PHURINE, GLUCOSEU, HGBUR, BILIRUBINUR, KETONESUR, PROTEINUR, UROBILINOGEN, NITRITE, LEUKOCYTESUR Sepsis Labs: @LABRCNTIP (procalcitonin:4,lacticidven:4)  )No results found for this or any previous visit (from the past 240 hour(s)).       Radiology Studies: Dg Abd Portable 1v  Result Date: 01/31/2017 CLINICAL DATA:  NG tube placement EXAM: PORTABLE ABDOMEN - 1 VIEW COMPARISON:  None. FINDINGS: There is significant gaseous distension of small bowel with progression from prior exam. In mid abdomen small bowel measures at least 7.3 cm in diameter with progression from prior exam. NG tube is noted coiled within proximal stomach with tip in proximal stomach lateral. Stable postsurgical changes lumbar spine. IMPRESSION: Significant diffuse gaseous distension of the small bowel up to 7.3 cm highly suspicious for small bowel obstruction. There is progression from prior exam. NG tube coiled within proximal stomach with tip in proximal stomach laterally. Electronically Signed   By: Lahoma Crocker M.D.   On: 01/31/2017 11:22        Scheduled Meds: . diclofenac sodium  2 g Topical Daily  . enoxaparin (LOVENOX) injection  40 mg Subcutaneous Q24H  . feeding supplement  1 Container Oral BID BM  . feeding supplement (PRO-STAT SUGAR  FREE 64)  30 mL Oral BID  . iopamidol      . LORazepam  0.5 mg Intravenous QHS   Continuous Infusions: . dextrose 5 % and 0.45 % NaCl with KCl 20 mEq/L 100 mL/hr at 01/31/17 0541  . dextrose 5 % and 0.45 % NaCl with KCl 20 mEq/L    . Marland KitchenTPN (CLINIMIX-E) Adult     And  . fat emulsion       LOS: 7 days    Time spent: 25 minutes. Greater than 50% of this time was spent in direct contact with the patient coordinating care.     Lelon Frohlich, MD [facility] Pager [phone]  If 7PM-7AM, please contact night-coverage [email or web address] Password [email or web address] 01/31/2017, 5:02 PM

## 2017-01-31 NOTE — Progress Notes (Signed)
PHARMACY - ADULT TOTAL PARENTERAL NUTRITION CONSULT NOTE   Pharmacy Consult for TPN Indication: Bowel obstruction  Patient Measurements: Height: 6' (182.9 cm) Weight: 161 lb 6.4 oz (73.2 kg) IBW/kg (Calculated) : 77.6 TPN AdjBW (KG): 73.2 Body mass index is 21.89 kg/m.  Assessment: 83 year old male with history of hypertension, remote left kidney carcinoma status post partial nephrectomy, chronic bradycardia, recent back surgery who presented with abdominal pain for past 3 days. Denied any fevers, chills, nausea or vomiting. Denied dysuria or diarrhea.  GI: Pt has gone approximately 10 days with minimal to no nutrition. At this time, TPN is warranted if patient shows intolerance to clear liquids, as he on advancement before.   Insulin requirements in the past 24 hours: none BMP Latest Ref Rng & Units 01/30/2017 01/29/2017 01/28/2017  Glucose 65 - 99 mg/dL 155(H) 161(H) 172(H)  BUN 6 - 20 mg/dL 27(H) 26(H) 37(H)  Creatinine 0.61 - 1.24 mg/dL 1.29(H) 1.15 1.07  Sodium 135 - 145 mmol/L 139 141 144  Potassium 3.5 - 5.1 mmol/L 4.1 4.1 4.1  Chloride 101 - 111 mmol/L 101 103 105  CO2 22 - 32 mmol/L 32 32 31  Calcium 8.9 - 10.3 mg/dL 8.6(L) 8.7(L) 9.0   TPN Access: central IV access TPN start date: 01/31/17 Current Nutrition: none  Nutritional Goals (per RD recommendation on 01/30/2017): Estimated Nutritional Needs:  Kcal:  2050-2250 (28-31 kcal/kg bw) Protein:  88-102 g Pro (1.2-1.4 g/kg bw) Fluid:  >1.8 L (25 ml/kg bw)  Plan:  Initiate Clinimix E 5/15 at 13ml/hr, titrate to goal rate as tolerated 20% lipid emulsion at 79ml/hr (infuse over 12 hours) Adjust IVMF (D5 1/2NS with KCL 12meq/L) to 60 ml/hr when TPN starts This provides 48 g of protein and 1162 kCals per day meeting ~50% of protein and ~53% of kCal needs MVI and trace elements in TPN Monitor TPN labs, renal fxn, fluid balance, glucose tolerance  Joshua Downs, Joshua Downs A 01/31/2017,1:05 PM

## 2017-01-31 NOTE — Evaluation (Signed)
Physical Therapy Evaluation Patient Details Name: Joshua Downs MRN: [id number] DOB: 1931/01/08 Today's Date: 01/31/2017   History of Present Illness  83 y.o. male with medical history significant of macular degeneration; HTN; remote left kidney CA s/p partial nephrectomy; chronic bradycardia; and recent back surgery presenting with abdominal pain.  He came by his PCP office on Monday and tried to get an appointment - his abdomen was swelling, no BMs, no UOP, n/v.  Symptoms started Saturday night into Sunday morning.  He was seen today and they sent him to the ER.  Started with periumbilical pain - fleeting but would take your breath.  Got to the point where he didn't want to eat/drink.  Black emesis.  Last night about 0100, vomited more black emesis.  Last BM was Saturday AM and was normal.  No fevers.  Unable to pee up to once a day right now.  Last void was last night.  No burning with urination.  Abdominal surgeries: hernia; renal cancer.  CT: Severe high-grade SBO with abrupt transition point in the mid-abdomen.  Per pt, he will be going for surgery today or tomorrow.     Clinical Impression  Pt received in bed, and was agreeable to PT evaluation.  Pt is normally unlimited with community ambulation using a cane, and is independent with ADL's.  During PT evaluation, he required Min A for log roll to perform supine<>sit, and Min A for sit<>stand.  He was able to ambulate 315ft at Mod (I) level, but very fatigued at the end with poor forward flexed posture.  Will plan to see pt after surgery, however at this time, he would benefit from HHPT upon discharge to ensure increasing strength and return to PLOF.      Follow Up Recommendations Home health PT    Equipment Recommendations  None recommended by PT    Recommendations for Other Services       Precautions / Restrictions Precautions Precaution Comments: recent back surgery on the 01/04/2017 - Left LUMBAR ONE - LUMBAR TWO  LAMINOTOMY AND  MICRODISCECTOMY. Restrictions Weight Bearing Restrictions: No      Mobility  Bed Mobility Overal bed mobility: Needs Assistance Bed Mobility: Rolling;Sidelying to Sit Rolling: Min assist Sidelying to sit: Min assist;HOB elevated          Transfers Overall transfer level: Needs assistance Equipment used: Rolling walker (2 wheeled) Transfers: Sit to/from Stand Sit to Stand: Min assist         General transfer comment: vc's for hand placement  Ambulation/Gait Ambulation/Gait assistance: Supervision;Modified independent (Device/Increase time) Ambulation Distance (Feet): 300 Feet Assistive device: Rolling walker (2 wheeled) Gait Pattern/deviations: Trunk flexed;Step-through pattern   Gait velocity interpretation: <1.8 ft/sec, indicative of risk for recurrent falls    Stairs            Wheelchair Mobility    Modified Rankin (Stroke Patients Only)       Balance Overall balance assessment: Modified Independent                                           Pertinent Vitals/Pain Pain Assessment: No/denies pain    Home Living   Living Arrangements: Children (dtr - home most of the time.  )   Type of Home: House Home Access: Stairs to enter   [employer] of Steps: front - 2, and 4-5 on the back that aren't  used much.  Home Layout: One level Home Equipment: Cane - single point;Walker - 2 wheels;Bedside commode;Shower seat (lift chair)      Prior Function     Gait / Transfers Assistance Needed: Pt uses the cane most of the time due to balance due to back surgeries.  Still driving, Unlimited community ambulation.   ADL's / Homemaking Assistance Needed: independent        Hand Dominance   Dominant Hand: Right    Extremity/Trunk Assessment   Upper Extremity Assessment Upper Extremity Assessment: Generalized weakness    Lower Extremity Assessment Lower Extremity Assessment: Generalized weakness       Communication    Communication: No difficulties  Cognition Arousal/Alertness: Awake/alert Behavior During Therapy: WFL for tasks assessed/performed Overall Cognitive Status: Within Functional Limits for tasks assessed                                        General Comments      Exercises     Assessment/Plan    PT Assessment Patient needs continued PT services  PT Problem List Decreased strength;Decreased activity tolerance;Decreased balance;Decreased mobility       PT Treatment Interventions DME instruction;Gait training;Functional mobility training;Therapeutic activities;Therapeutic exercise;Balance training;Patient/family education    PT Goals (Current goals can be found in the Care Plan section)  Acute Rehab PT Goals Patient Stated Goal: To get stronger and go back home.  PT Goal Formulation: With patient Time For Goal Achievement: 02/07/17 Potential to Achieve Goals: Good    Frequency Min 3X/week   Barriers to discharge        Co-evaluation               End of Session Equipment Utilized During Treatment: Gait belt Activity Tolerance: Patient limited by fatigue Patient left: in chair;with call bell/phone within reach Nurse Communication: Mobility status Ander Purpura, RN aware of pt's mobility and moblity sheet left in the room. ) PT Visit Diagnosis: Muscle weakness (generalized) (M62.81);Other abnormalities of gait and mobility (R26.89)    Time: 3244-0102 PT Time Calculation (min) (ACUTE ONLY): 36 min   Charges:         PT G Codes:   PT G-Codes **NOT FOR INPATIENT CLASS** Functional Assessment Tool Used: AM-PAC 6 Clicks Basic Mobility Functional Limitation: Mobility: Walking and moving around;Changing and maintaining body position Mobility: Walking and Moving Around Current Status [phone]): At least 20 percent but less than 40 percent impaired, limited or restricted Mobility: Walking and Moving Around Goal Status [phone]): At least 1 percent but less than  20 percent impaired, limited or restricted    Beth Chrishawna Farina, PT, DPT X: [phone]

## 2017-01-31 NOTE — Progress Notes (Signed)
Patient's sons, Berline Chough, and daughter Manuela Schwartz would like to talk to Dr. Rosana Hoes the surgeon about patient's care.

## 2017-01-31 NOTE — Progress Notes (Addendum)
ADDENDUM: Family expresses that patient has had decreased appetite since at least Thanksgiving and has lost 25 lbs over the past month since his spine surgery. Family also adds that patient's 2004 partial nephrectomy was performed laparoscopically for a small Left renal malignancy, and patient has followed up with his urologist annually, most recently this past January.       SURGICAL PROGRESS NOTE (cpt [phone])  Hospital Day(s): 7.   Post op day(s):  Marland Kitchen   Interval History: Patient seen and examined, continues to report ongoing small amounts of flatus, but feels more distended this morning than yesterday and vomited non-bloody emesis x1 this morning. He otherwise denies abdominal pain, fever/chills, CP, or SOB.  Review of Systems:  Constitutional: denies fever, chills  HEENT: denies cough or congestion  Respiratory: denies any shortness of breath  Cardiovascular: denies chest pain or palpitations  Gastrointestinal: abdominal pain, N/V, and bowel function as per interval history Genitourinary: denies burning with urination or urinary frequency Musculoskeletal: denies pain, decreased motor or sensation Integumentary: denies any other rashes or skin discolorations Neurological: denies HA or vision/hearing changes   Vital signs in last 24 hours: [min-max] current  Temp:  [98 F (36.7 C)-98.4 F (36.9 C)] 98.1 F (36.7 C) (04/04 0640) Pulse Rate:  [92-105] 92 (04/04 0640) Resp:  [18-20] 20 (04/04 0640) BP: (103-126)/(62-77) 126/77 (04/04 0640) SpO2:  [94 %-100 %] 96 % (04/04 0640)     Height: 6' (182.9 cm) Weight: 73.2 kg (161 lb 6.4 oz) BMI (Calculated): 21.9   Intake/Output this shift:  Total I/O In: 428.3 [P.O.:120; I.V.:308.3] Out: -    Intake/Output last 2 shifts:  @IOLAST2SHIFTS @   Physical Exam:  Constitutional: alert, cooperative and no distress  HENT: normocephalic without obvious abnormality  Eyes: PERRL, EOM's grossly intact and symmetric  Neuro: CN II - XII grossly  intact and symmetric without deficit  Respiratory: breathing non-labored at rest  Cardiovascular: regular rate and sinus rhythm  Gastrointestinal: soft and non-tender, but clearly more distended than yesterday (moderate) Musculoskeletal: UE and LE FROM, no edema or wounds, motor and sensation grossly intact, NT   Labs:  CBC:  Lab Results  Component Value Date   WBC 4.3 01/25/2017   RBC 4.61 01/25/2017   BMP:  Lab Results  Component Value Date   GLUCOSE 155 (H) 01/30/2017   CO2 32 01/30/2017   BUN 27 (H) 01/30/2017   CREATININE 1.29 (H) 01/30/2017   CALCIUM 8.6 (L) 01/30/2017     Imaging studies: No new pertinent imaging studies   Assessment/Plan: (ICD-10's: K56.52) 83 y.o.malewith complete small bowel obstruction, likely attributable to post-surgical adhesions though recurrent renal carcinoma remains a concern, complicated by small-moderate amount of narcotics for post-surgical pain s/p recent spine surgery and bypertinent comorbidities including HTN, chronic bradycardia with Right BBB, BPH, and osteoarthritis with chronic back pain.  - reinsert NG tube - PICC requested for TPN - all risks, benefits, and alternatives to laparotomy + adhesiolysis, possible small bowel resection discussed, informed consent obtained - if OR is not available for non-emergent surgery today, will meanwhile obtain CT with oral contrast pending surgery tomorrow - ambulation encouraged - DVT prophylaxis  All of the above findings and recommendations were discussed with the patient and his family, and all of patient's and his family's questions were answered to theirexpressed satisfaction.  -- Marilynne Drivers Rosana Hoes, MD, [place]: [facility] Office: [phone]

## 2017-01-31 NOTE — Progress Notes (Addendum)
Patient vomited twice during the night after his NG tube was removed yesterday, so daughter has called and requested a GI consult. Notified hospitalist.   Also, notified surgeon Dr. Rosana Hoes of patient's condition.

## 2017-02-01 ENCOUNTER — Inpatient Hospital Stay (HOSPITAL_COMMUNITY): Payer: Medicare Other | Admitting: Anesthesiology

## 2017-02-01 ENCOUNTER — Encounter (HOSPITAL_COMMUNITY): Payer: Self-pay | Admitting: Anesthesiology

## 2017-02-01 ENCOUNTER — Encounter (HOSPITAL_COMMUNITY): Admission: EM | Disposition: E | Payer: Self-pay | Source: Home / Self Care | Attending: Internal Medicine

## 2017-02-01 DIAGNOSIS — K5652 Intestinal adhesions [bands] with complete obstruction: Secondary | ICD-10-CM

## 2017-02-01 HISTORY — PX: LAPAROTOMY: SHX154

## 2017-02-01 HISTORY — PX: LYSIS OF ADHESION: SHX5961

## 2017-02-01 LAB — SURGICAL PCR SCREEN
MRSA, PCR: NEGATIVE
Staphylococcus aureus: POSITIVE — AB

## 2017-02-01 LAB — CBC
HCT: 43.4 % (ref 39.0–52.0)
HEMOGLOBIN: 14.6 g/dL (ref 13.0–17.0)
MCH: 29 pg (ref 26.0–34.0)
MCHC: 33.6 g/dL (ref 30.0–36.0)
MCV: 86.3 fL (ref 78.0–100.0)
PLATELETS: 286 10*3/uL (ref 150–400)
RBC: 5.03 MIL/uL (ref 4.22–5.81)
RDW: 14 % (ref 11.5–15.5)
WBC: 9.7 10*3/uL (ref 4.0–10.5)

## 2017-02-01 LAB — DIFFERENTIAL
BASOS PCT: 0 %
Band Neutrophils: 8 %
Basophils Absolute: 0 10*3/uL (ref 0.0–0.1)
Blasts: 0 %
EOS PCT: 5 %
Eosinophils Absolute: 0.5 10*3/uL (ref 0.0–0.7)
LYMPHS PCT: 13 %
Lymphs Abs: 1.3 10*3/uL (ref 0.7–4.0)
Metamyelocytes Relative: 0 %
Monocytes Absolute: 0.8 10*3/uL (ref 0.1–1.0)
Monocytes Relative: 8 %
Myelocytes: 0 %
NEUTROS PCT: 66 %
NRBC: 0 /100{WBCs}
Neutro Abs: 7.1 10*3/uL (ref 1.7–7.7)
Other: 0 %
Promyelocytes Absolute: 0 %

## 2017-02-01 LAB — MAGNESIUM: MAGNESIUM: 2 mg/dL (ref 1.7–2.4)

## 2017-02-01 LAB — COMPREHENSIVE METABOLIC PANEL
ALK PHOS: 73 U/L (ref 38–126)
ALT: 57 U/L (ref 17–63)
ANION GAP: 10 (ref 5–15)
AST: 29 U/L (ref 15–41)
Albumin: 2.9 g/dL — ABNORMAL LOW (ref 3.5–5.0)
BILIRUBIN TOTAL: 0.8 mg/dL (ref 0.3–1.2)
BUN: 41 mg/dL — ABNORMAL HIGH (ref 6–20)
CO2: 36 mmol/L — AB (ref 22–32)
CREATININE: 1.8 mg/dL — AB (ref 0.61–1.24)
Calcium: 8.6 mg/dL — ABNORMAL LOW (ref 8.9–10.3)
Chloride: 86 mmol/L — ABNORMAL LOW (ref 101–111)
GFR calc Af Amer: 38 mL/min — ABNORMAL LOW (ref >60)
GFR calc non Af Amer: 33 mL/min — ABNORMAL LOW (ref >60)
Glucose, Bld: 177 mg/dL — ABNORMAL HIGH (ref 65–99)
Potassium: 3.9 mmol/L (ref 3.5–5.1)
Sodium: 132 mmol/L — ABNORMAL LOW (ref 135–145)
TOTAL PROTEIN: 6.8 g/dL (ref 6.5–8.1)

## 2017-02-01 LAB — TRIGLYCERIDES: Triglycerides: 114 mg/dL (ref <150)

## 2017-02-01 LAB — GLUCOSE, CAPILLARY
GLUCOSE-CAPILLARY: 137 mg/dL — AB (ref 65–99)
GLUCOSE-CAPILLARY: 149 mg/dL — AB (ref 65–99)
Glucose-Capillary: 152 mg/dL — ABNORMAL HIGH (ref 65–99)
Glucose-Capillary: 149 mg/dL — ABNORMAL HIGH (ref 65–99)

## 2017-02-01 LAB — PHOSPHORUS: PHOSPHORUS: 4.7 mg/dL — AB (ref 2.5–4.6)

## 2017-02-01 LAB — PREALBUMIN: Prealbumin: 8.7 mg/dL — ABNORMAL LOW (ref 18–38)

## 2017-02-01 SURGERY — LAPAROTOMY, EXPLORATORY
Anesthesia: General | Site: Abdomen

## 2017-02-01 MED ORDER — DEXTROSE 5 % IV SOLN
2.0000 g | INTRAVENOUS | Status: AC
  Administered 2017-02-01: 2 g via INTRAVENOUS

## 2017-02-01 MED ORDER — PROPOFOL 10 MG/ML IV BOLUS
INTRAVENOUS | Status: DC | PRN
  Administered 2017-02-01: 120 mg via INTRAVENOUS

## 2017-02-01 MED ORDER — SUCCINYLCHOLINE CHLORIDE 20 MG/ML IJ SOLN
INTRAMUSCULAR | Status: AC
  Filled 2017-02-01: qty 1

## 2017-02-01 MED ORDER — BUPIVACAINE LIPOSOME 1.3 % IJ SUSP
INTRAMUSCULAR | Status: DC | PRN
  Administered 2017-02-01: 20 mL

## 2017-02-01 MED ORDER — ONDANSETRON HCL 4 MG/2ML IJ SOLN
4.0000 mg | Freq: Once | INTRAMUSCULAR | Status: AC
  Administered 2017-02-01: 4 mg via INTRAVENOUS

## 2017-02-01 MED ORDER — NEOSTIGMINE METHYLSULFATE 10 MG/10ML IV SOLN
INTRAVENOUS | Status: DC | PRN
  Administered 2017-02-01: 2 mg via INTRAVENOUS

## 2017-02-01 MED ORDER — ROCURONIUM BROMIDE 50 MG/5ML IV SOLN
INTRAVENOUS | Status: AC
  Filled 2017-02-01: qty 1

## 2017-02-01 MED ORDER — FENTANYL CITRATE (PF) 250 MCG/5ML IJ SOLN
INTRAMUSCULAR | Status: AC
  Filled 2017-02-01: qty 5

## 2017-02-01 MED ORDER — ROCURONIUM 10MG/ML (10ML) SYRINGE FOR MEDFUSION PUMP - OPTIME
INTRAVENOUS | Status: DC | PRN
  Administered 2017-02-01: 10 mg via INTRAVENOUS
  Administered 2017-02-01: 35 mg via INTRAVENOUS
  Administered 2017-02-01: 5 mg via INTRAVENOUS
  Administered 2017-02-01: 10 mg via INTRAVENOUS

## 2017-02-01 MED ORDER — M.V.I. ADULT IV INJ
INJECTION | INTRAVENOUS | Status: AC
  Administered 2017-02-01: 18:00:00 via INTRAVENOUS
  Filled 2017-02-01: qty 960

## 2017-02-01 MED ORDER — SUCCINYLCHOLINE 20MG/ML (10ML) SYRINGE FOR MEDFUSION PUMP - OPTIME
INTRAMUSCULAR | Status: DC | PRN
  Administered 2017-02-01: 100 mg via INTRAVENOUS

## 2017-02-01 MED ORDER — PROPOFOL 10 MG/ML IV BOLUS
INTRAVENOUS | Status: AC
  Filled 2017-02-01: qty 20

## 2017-02-01 MED ORDER — PHENYLEPHRINE HCL 10 MG/ML IJ SOLN
INTRAMUSCULAR | Status: DC | PRN
  Administered 2017-02-01 (×5): 80 ug via INTRAVENOUS

## 2017-02-01 MED ORDER — DEXTROSE 5 % IV SOLN
INTRAVENOUS | Status: AC
  Filled 2017-02-01: qty 2

## 2017-02-01 MED ORDER — DEXTROSE 5 % IV SOLN
2.0000 g | Freq: Once | INTRAVENOUS | Status: AC
  Administered 2017-02-02: 2 g via INTRAVENOUS
  Filled 2017-02-01: qty 2

## 2017-02-01 MED ORDER — FAT EMULSION 20 % IV EMUL
250.0000 mL | INTRAVENOUS | Status: AC
  Administered 2017-02-01: 250 mL via INTRAVENOUS
  Filled 2017-02-01: qty 250

## 2017-02-01 MED ORDER — POVIDONE-IODINE 10 % EX OINT
TOPICAL_OINTMENT | CUTANEOUS | Status: AC
  Filled 2017-02-01: qty 1

## 2017-02-01 MED ORDER — ENOXAPARIN SODIUM 40 MG/0.4ML ~~LOC~~ SOLN
40.0000 mg | SUBCUTANEOUS | Status: DC
  Administered 2017-02-02: 40 mg via SUBCUTANEOUS
  Filled 2017-02-01: qty 0.4

## 2017-02-01 MED ORDER — MIDAZOLAM HCL 2 MG/2ML IJ SOLN
1.0000 mg | INTRAMUSCULAR | Status: AC
  Administered 2017-02-01: 2 mg via INTRAVENOUS

## 2017-02-01 MED ORDER — INSULIN ASPART 100 UNIT/ML ~~LOC~~ SOLN
0.0000 [IU] | SUBCUTANEOUS | Status: DC
  Administered 2017-02-01: 2 [IU] via SUBCUTANEOUS
  Administered 2017-02-01 – 2017-02-02 (×3): 3 [IU] via SUBCUTANEOUS
  Administered 2017-02-02: 2 [IU] via SUBCUTANEOUS

## 2017-02-01 MED ORDER — BUPIVACAINE LIPOSOME 1.3 % IJ SUSP
INTRAMUSCULAR | Status: AC
  Filled 2017-02-01: qty 20

## 2017-02-01 MED ORDER — HYDROMORPHONE HCL 1 MG/ML IJ SOLN
0.2500 mg | INTRAMUSCULAR | Status: DC | PRN
  Administered 2017-02-01 (×2): 0.25 mg via INTRAVENOUS
  Filled 2017-02-01: qty 1

## 2017-02-01 MED ORDER — SODIUM CHLORIDE 0.9 % IR SOLN
Status: DC | PRN
  Administered 2017-02-01: 1000 mL

## 2017-02-01 MED ORDER — METRONIDAZOLE IN NACL 5-0.79 MG/ML-% IV SOLN
500.0000 mg | Freq: Three times a day (TID) | INTRAVENOUS | Status: DC
  Administered 2017-02-01: 500 mg via INTRAVENOUS

## 2017-02-01 MED ORDER — METRONIDAZOLE IN NACL 5-0.79 MG/ML-% IV SOLN
500.0000 mg | Freq: Three times a day (TID) | INTRAVENOUS | Status: AC
  Administered 2017-02-01 – 2017-02-02 (×2): 500 mg via INTRAVENOUS
  Filled 2017-02-01 (×2): qty 100

## 2017-02-01 MED ORDER — MUPIROCIN 2 % EX OINT
TOPICAL_OINTMENT | Freq: Two times a day (BID) | CUTANEOUS | Status: DC
  Administered 2017-02-01 (×2): via NASAL
  Administered 2017-02-02: 1 via NASAL
  Administered 2017-02-02 – 2017-02-05 (×7): via NASAL
  Administered 2017-02-06: 1 via NASAL
  Administered 2017-02-06 – 2017-02-08 (×5): via NASAL
  Filled 2017-02-01 (×2): qty 22

## 2017-02-01 MED ORDER — SODIUM CHLORIDE 0.9 % IJ SOLN
INTRAMUSCULAR | Status: AC
  Filled 2017-02-01: qty 20

## 2017-02-01 MED ORDER — GLYCOPYRROLATE 0.2 MG/ML IJ SOLN
INTRAMUSCULAR | Status: DC | PRN
  Administered 2017-02-01: .5 mg via INTRAVENOUS

## 2017-02-01 MED ORDER — FENTANYL CITRATE (PF) 100 MCG/2ML IJ SOLN
INTRAMUSCULAR | Status: DC | PRN
Start: 2017-02-01 — End: 2017-02-01
  Administered 2017-02-01 (×8): 50 ug via INTRAVENOUS

## 2017-02-01 MED ORDER — LACTATED RINGERS IV SOLN
INTRAVENOUS | Status: DC
  Administered 2017-02-01 (×4): via INTRAVENOUS

## 2017-02-01 MED ORDER — MIDAZOLAM HCL 2 MG/2ML IJ SOLN
INTRAMUSCULAR | Status: AC
  Filled 2017-02-01: qty 2

## 2017-02-01 MED ORDER — PHENYLEPHRINE 40 MCG/ML (10ML) SYRINGE FOR IV PUSH (FOR BLOOD PRESSURE SUPPORT)
PREFILLED_SYRINGE | INTRAVENOUS | Status: AC
  Filled 2017-02-01: qty 10

## 2017-02-01 MED ORDER — METRONIDAZOLE IN NACL 5-0.79 MG/ML-% IV SOLN
INTRAVENOUS | Status: AC
  Filled 2017-02-01: qty 100

## 2017-02-01 MED ORDER — ONDANSETRON HCL 4 MG/2ML IJ SOLN
INTRAMUSCULAR | Status: AC
  Filled 2017-02-01: qty 2

## 2017-02-01 MED ORDER — LIDOCAINE HCL (CARDIAC) 10 MG/ML IV SOLN
INTRAVENOUS | Status: DC | PRN
  Administered 2017-02-01: 40 mg via INTRAVENOUS

## 2017-02-01 MED ORDER — POVIDONE-IODINE 10 % EX OINT
TOPICAL_OINTMENT | CUTANEOUS | Status: DC | PRN
  Administered 2017-02-01: 1 via TOPICAL

## 2017-02-01 MED ORDER — LIDOCAINE HCL (PF) 1 % IJ SOLN
INTRAMUSCULAR | Status: AC
  Filled 2017-02-01: qty 5

## 2017-02-01 MED ORDER — HYDROMORPHONE HCL 1 MG/ML IJ SOLN
0.5000 mg | INTRAMUSCULAR | Status: DC | PRN
  Administered 2017-02-01 – 2017-02-09 (×21): 0.5 mg via INTRAVENOUS
  Filled 2017-02-01 (×22): qty 1

## 2017-02-01 SURGICAL SUPPLY — 89 items
APPLIER CLIP 11 MED OPEN (CLIP)
APPLIER CLIP 13 LRG OPEN (CLIP)
APR CLP LRG 13 20 CLIP (CLIP)
APR CLP MED 11 20 MLT OPN (CLIP)
BAG HAMPER (MISCELLANEOUS) ×3 IMPLANT
BARRIER SKIN 2 3/4 (OSTOMY) IMPLANT
BARRIER SKIN 2 3/4 INCH (OSTOMY)
BLADE SURG SZ10 CARB STEEL (BLADE) ×3 IMPLANT
BRR SKN FLT 2.75X2.25 2 PC (OSTOMY)
CELLS DAT CNTRL 66122 CELL SVR (MISCELLANEOUS) IMPLANT
CHLORAPREP W/TINT 26ML (MISCELLANEOUS) ×3 IMPLANT
CLAMP POUCH DRAINAGE QUIET (OSTOMY) IMPLANT
CLIP APPLIE 11 MED OPEN (CLIP) IMPLANT
CLIP APPLIE 13 LRG OPEN (CLIP) IMPLANT
CLOTH BEACON ORANGE TIMEOUT ST (SAFETY) ×3 IMPLANT
COVER LIGHT HANDLE STERIS (MISCELLANEOUS) ×9 IMPLANT
COVER MAYO STAND XLG (DRAPE) IMPLANT
DRAIN PENROSE 18X1/2 LTX STRL (DRAIN) ×2 IMPLANT
DRAPE PROXIMA HALF (DRAPES) IMPLANT
DRAPE WARM FLUID 44X44 (DRAPE) ×3 IMPLANT
DRSG OPSITE POSTOP 4X10 (GAUZE/BANDAGES/DRESSINGS) ×3 IMPLANT
ELECT BLADE 6 FLAT ULTRCLN (ELECTRODE) ×1 IMPLANT
ELECT REM PT RETURN 9FT ADLT (ELECTROSURGICAL) ×6
ELECTRODE REM PT RTRN 9FT ADLT (ELECTROSURGICAL) ×2 IMPLANT
EVACUATOR DRAINAGE 10X20 100CC (DRAIN) IMPLANT
EVACUATOR SILICONE 100CC (DRAIN)
GAUZE SPONGE 4X4 12PLY STRL (GAUZE/BANDAGES/DRESSINGS) ×3 IMPLANT
GAUZE SPONGE 4X4 16PLY XRAY LF (GAUZE/BANDAGES/DRESSINGS) ×6 IMPLANT
GLOVE BIOGEL PI IND STRL 7.5 (GLOVE) ×3 IMPLANT
GLOVE BIOGEL PI INDICATOR 7.5 (GLOVE) ×6
GLOVE ECLIPSE 7.0 STRL STRAW (GLOVE) ×12 IMPLANT
GOWN STRL REUS W/TWL LRG LVL3 (GOWN DISPOSABLE) ×9 IMPLANT
HANDLE SUCTION POOLE (INSTRUMENTS) ×1 IMPLANT
INST SET MAJOR GENERAL (KITS) ×3 IMPLANT
KIT ROOM TURNOVER APOR (KITS) ×3 IMPLANT
MANIFOLD NEPTUNE II (INSTRUMENTS) ×3 IMPLANT
NS IRRIG 1000ML POUR BTL (IV SOLUTION) ×3 IMPLANT
PACK ABDOMINAL MAJOR (CUSTOM PROCEDURE TRAY) ×3 IMPLANT
PACK PERI GYN (CUSTOM PROCEDURE TRAY) IMPLANT
PAD ABD 5X9 TENDERSORB (GAUZE/BANDAGES/DRESSINGS) IMPLANT
PAD ARMBOARD 7.5X6 YLW CONV (MISCELLANEOUS) ×7 IMPLANT
PENCIL HANDSWITCHING (ELECTRODE) ×3 IMPLANT
POUCH OSTOMY 2 3/4  H 3804 (WOUND CARE)
POUCH OSTOMY 2 3/4 H 3804 (WOUND CARE)
POUCH OSTOMY 2 PC DRNBL 2.75 (WOUND CARE) IMPLANT
RELOAD LINEAR CUT PROX 55 BLUE (ENDOMECHANICALS) ×9 IMPLANT
RELOAD PROXIMATE 75MM BLUE (ENDOMECHANICALS) IMPLANT
RELOAD STAPLE 55 3.8 BLU REG (ENDOMECHANICALS) IMPLANT
RELOAD STAPLE 75 3.8 BLU REG (ENDOMECHANICALS) IMPLANT
RELOAD STAPLER LINEAR PROX 30 (STAPLE) IMPLANT
RETRACTOR WND ALEXIS 25 LRG (MISCELLANEOUS) IMPLANT
RETRACTOR WOUND ALXS 34CM XLRG (MISCELLANEOUS) IMPLANT
RTRCTR WOUND ALEXIS 18CM MED (MISCELLANEOUS)
RTRCTR WOUND ALEXIS 25CM LRG (MISCELLANEOUS) ×3
RTRCTR WOUND ALEXIS 34CM XLRG (MISCELLANEOUS)
SEALER TISSUE G2 CVD JAW 35 (ENDOMECHANICALS) IMPLANT
SEALER TISSUE G2 CVD JAW 45CM (ENDOMECHANICALS)
SEALER TISSUE X1 CVD JAW (INSTRUMENTS) IMPLANT
SET BASIN LINEN APH (SET/KITS/TRAYS/PACK) ×3 IMPLANT
SPONGE INTESTINAL PEANUT (DISPOSABLE) IMPLANT
SPONGE LAP 18X18 X RAY DECT (DISPOSABLE) ×3 IMPLANT
STAPLER CIRC CVD 29MM 37CM (STAPLE) IMPLANT
STAPLER GUN LINEAR PROX 60 (STAPLE) IMPLANT
STAPLER PROXIMATE 55 BLUE (STAPLE) ×3 IMPLANT
STAPLER PROXIMATE 75MM BLUE (STAPLE) IMPLANT
STAPLER RELOAD LINEAR PROX 30 (STAPLE)
STAPLER SYS INTERNAL RELOAD SS (MISCELLANEOUS) IMPLANT
STAPLER VISISTAT (STAPLE) ×3 IMPLANT
STAPLER VISISTAT 35W (STAPLE) IMPLANT
SUCTION POOLE HANDLE (INSTRUMENTS) ×3
SUT CHROMIC 0 SH (SUTURE) IMPLANT
SUT CHROMIC 2 0 SH (SUTURE) IMPLANT
SUT CHROMIC 3 0 SH 27 (SUTURE) IMPLANT
SUT ETHILON 3 0 FSL (SUTURE) IMPLANT
SUT PDS AB 0 CTX 60 (SUTURE) ×6 IMPLANT
SUT PROLENE 3 0 PS 2 (SUTURE) IMPLANT
SUT SILK 0 FSL (SUTURE) IMPLANT
SUT SILK 2 0 (SUTURE)
SUT SILK 2 0 REEL (SUTURE) IMPLANT
SUT SILK 2-0 18XBRD TIE 12 (SUTURE) IMPLANT
SUT SILK 3 0 SH CR/8 (SUTURE) ×3 IMPLANT
SUT VIC AB 0 CT1 18XCR BRD 8 (SUTURE) IMPLANT
SUT VIC AB 0 CT1 8-18 (SUTURE)
SUT VICRYL AB 2 0 TIES (SUTURE) ×2 IMPLANT
SUT VICRYL AB 3 0 TIES (SUTURE) IMPLANT
TOWEL BLUE STERILE X RAY DET (MISCELLANEOUS) ×3 IMPLANT
TRAY FOLEY BAG SILVER LF 16FR (CATHETERS) ×1 IMPLANT
TRAY FOLEY CATH 16FR SILVER (SET/KITS/TRAYS/PACK) ×3 IMPLANT
YANKAUER SUCT BULB TIP 10FT TU (MISCELLANEOUS) ×3 IMPLANT

## 2017-02-01 NOTE — Anesthesia Procedure Notes (Signed)
Procedure Name: Intubation Date/Time: 02/21/2017 9:25 AM Performed by: [name] E Pre-anesthesia Checklist: Patient identified, Patient being monitored, Timeout performed, Emergency Drugs available and Suction available Patient Re-evaluated:Patient Re-evaluated prior to inductionOxygen Delivery Method: Circle system utilized Preoxygenation: Pre-oxygenation with 100% oxygen Intubation Type: IV induction, Rapid sequence and Cricoid Pressure applied Ventilation: Mask ventilation without difficulty Laryngoscope Size: Mac and 3 Grade View: Grade I Tube type: Oral Tube size: 7.0 mm Number of attempts: 1 Airway Equipment and Method: Stylet Placement Confirmation: ETT inserted through vocal cords under direct vision,  positive ETCO2 and breath sounds checked- equal and bilateral Secured at: 21 cm Tube secured with: Tape Dental Injury: Teeth and Oropharynx as per pre-operative assessment

## 2017-02-01 NOTE — Progress Notes (Signed)
SURGICAL POST-OPERATIVE NOTE   Patient seen and examined, no acute events since surgery. Patient reports moderate peri-incisional abdominal pain, worse with coughing, and a sore throat. He otherwise denies any N/V, fever/chills, CP, or SOB.   Review of Systems:  Constitutional: denies fever, chills  HEENT: denies cough or congestion  Respiratory: denies any shortness of breath  Cardiovascular: denies chest pain or palpitations  Gastrointestinal: denies N/V, diarrhea or constipation  Musculoskeletal: denies pain, decreased motor or sensation  Neurological: denies HA or vision/hearing changes   Vital signs  Vitals:   02/10/2017 1315 02/18/2017 1330 02/07/2017 1345 01/30/2017 1418  BP: 126/88 107/63 (!) 109/58 109/68  Pulse: (!) 104 (!) 112 (!) 111 (!) 105  Resp: 19 15 18 18   Temp:    98.6 F (37 C)  TempSrc:      SpO2: 100% 95% 94% 95%  Weight:      Height:           Height: 6' (182.9 cm)  Weight: 73.2 kg (161 lb 6.4 oz) BMI (Calculated): 21.9   Intake/Output:  Total I/O In: 3000 [I.V.:3000] Out: 610 [Urine:150; Other:400; Blood:60]   Physical Exam:  General: Alert, cooperative and no distress  Neuro: CNII - XII grossly intact and symmetric, no motor or sensory deficits  HENT: Normocephalic, without obvious abnormality  Eyes: PERRL, EOM's grossly intact and symmetric  Lungs: Breathing non-labored at rest  Cardiovascular: Regular rate and sinus rhythm  Gastrointestinal: Abdomen soft with moderate peri-incisional tenderness to palpation, ND, no palpable pulsatile mass  Extremities: UE and LE FROM, NT, no edema or wounds   Labs:  CBC:  Lab Results  Component Value Date   WBC 9.7 02/12/2017   RBC 5.03 02/07/2017   BMP:  Lab Results  Component Value Date   GLUCOSE 177 (H) 02/24/2017   CO2 36 (H) 02/26/2017   BUN 41 (H) 02/18/2017   CREATININE 1.80 (H) 01/31/2017   CALCIUM 8.6 (L) 02/02/2017     Imaging/Diagnostic studies:   Assessment/Plan:  83 y.o. male POD #0 s/p  exploratory laparotomy with lysis of adhesions and repair of Left-sided enterotomies x4 for refractory small bowel obstruction secondary to internal hernia associated with post-surgical adhesions following remote hand-assisted Left partial nephrectomy for malignancy, complicated by small-moderate amount of narcotics for post-surgical pain s/p recent spine surgery and bypertinent comorbidities including HTN, chronic bradycardia with Right BBB, BPH, and osteoarthritis with chronic back pain.   - NPO, TPN, IVF  - pain control as needed  - NG tube for nasogastric decompression - monitor ongoing bowel function and abdominal exam             - remove NG tube and start clear liquids when patient passing flatus - medical management as per medical team - ambulation encouraged - DVT prophylaxis  All of the above findings and recommendations were discussed with the patient and his family, and all of patient's and his family's questions were answered to their expressed satisfaction.  -- Marilynne Drivers Rosana Hoes, MD, [place]: [facility] Office: [phone]

## 2017-02-01 NOTE — Transfer of Care (Signed)
Immediate Anesthesia Transfer of Care Note  Patient: Joshua Downs  Procedure(s) Performed: Procedure(s): EXPLORATORY LAPAROTOMY (N/A) LYSIS OF ADHESION (N/A)  Patient Location: PACU  Anesthesia Type:General  Level of Consciousness: awake  Airway & Oxygen Therapy: Patient Spontanous Breathing  Post-op Assessment: Report given to RN  Post vital signs: Reviewed and stable  Last Vitals:  Vitals:   01/31/17 2220 02/10/2017 0837  BP: 111/70 105/79  Pulse: (!) 113 97  Resp: 15 18  Temp: 36.4 C 36.9 C    Last Pain:  Vitals:   02/05/2017 0837  TempSrc: Oral  PainSc: 7       Patients Stated Pain Goal: 5 (40/98/11 9147)  Complications: No apparent anesthesia complications

## 2017-02-01 NOTE — Progress Notes (Signed)
PROGRESS NOTE    Joshua Downs  IRJ:[id number] DOB: [birth year] DOA: 01/03/2017 PCP: Deloria Lair, MD     Brief Narrative:  83 year old man initially admitted to the hospital on 3/28 due to abdominal pain and found to have a small bowel obstruction. CT scan of the abdomen showed a high-grade small bowel obstruction with transition point in the mid abdomen. Was also noted to have acute kidney injury. NG tube was placed in the emergency department, surgical services were consulted. NG tube was initially removed on 3/30 and was reinserted on the same day due to recurrence of distention. NG tube was again removed on 4/3 due to patient's symptoms improving, unfortunately he has failed again with increased vomiting and NG tube has been replaced on 4/4 with plans to go to the OR on 4/5 for operative management.   Assessment & Plan:   Principal Problem:   Small bowel obstruction Active Problems:   Acute renal failure (ARF) (HCC)   Hyperglycemia   Protein-calorie malnutrition, severe   Small bowel obstruction -He has failed conservative management and went to OR on 4/5 for ex lap and lysis of adhesions. -Follow surgical recommendations.  Severe protein caloric malnutrition -Continue TPN.  Hypernatremia -Resolved.  Hypertension -By mouth meds on hold given nothing by mouth state. -BP has been well controlled.   DVT prophylaxis: Lovenox Code Status: Full code Family Communication: Patient only Disposition Plan: To or on 4/5  Consultants:   Surgery, Dr. Rosana Hoes  Procedures:   None  Antimicrobials:  Anti-infectives    Start     Dose/Rate Route Frequency Ordered Stop   02/02/17 0900  cefTRIAXone (ROCEPHIN) 2 g in dextrose 5 % 50 mL IVPB     2 g 100 mL/hr over 30 Minutes Intravenous  Once 02/12/2017 1429     02/05/2017 1730  metroNIDAZOLE (FLAGYL) IVPB 500 mg     500 mg 100 mL/hr over 60 Minutes Intravenous Every 8 hours 02/18/2017 1429 02/02/17 0929   02/18/2017 0822  cefTRIAXone  (ROCEPHIN) 2 g in dextrose 5 % 50 mL IVPB     2 g 100 mL/hr over 30 Minutes Intravenous On call to O.R. 02/22/2017 3016 02/02/2017 0920   01/30/2017 0822  metroNIDAZOLE (FLAGYL) IVPB 500 mg  Status:  Discontinued     500 mg 100 mL/hr over 60 Minutes Intravenous Every 8 hours 02/25/2017 0822 02/22/2017 1330       Subjective: Increased abdominal distention, multiple episodes of emesis overnight  Objective: Vitals:   02/26/2017 1315 01/28/2017 1330 02/25/2017 1345 02/13/2017 1418  BP: 126/88 107/63 (!) 109/58 109/68  Pulse: (!) 104 (!) 112 (!) 111 (!) 105  Resp: 19 15 18 18   Temp:    98.6 F (37 C)  TempSrc:      SpO2: 100% 95% 94% 95%  Weight:      Height:        Intake/Output Summary (Last 24 hours) at 02/18/2017 1658 Last data filed at 02/13/2017 1345  Gross per 24 hour  Intake             3941 ml  Output             2060 ml  Net             1881 ml   Filed Weights   12/29/2016 1204 01/23/2017 1807  Weight: 76.2 kg (168 lb) 73.2 kg (161 lb 6.4 oz)    Examination:  General exam: Alert, awake, oriented x 3 Respiratory system: Clear to  auscultation. Respiratory effort normal. Cardiovascular system:RRR. No murmurs, rubs, gallops. Gastrointestinal system: Abdomen is distended, hypoactive bowel sounds heard. Central nervous system: Alert and oriented. No focal neurological deficits. Extremities: No C/C/E, +pedal pulses Skin: No rashes, lesions or ulcers Psychiatry: Judgement and insight appear normal. Mood & affect appropriate.     Data Reviewed: I have personally reviewed following labs and imaging studies  CBC:  Recent Labs Lab 02/21/2017 0520  WBC 9.7  NEUTROABS 7.1  HGB 14.6  HCT 43.4  MCV 86.3  PLT 163   Basic Metabolic Panel:  Recent Labs Lab 01/27/17 0604 01/28/17 0636 01/29/17 0443 01/30/17 0553 02/03/2017 0520  NA 146* 144 141 139 132*  K 4.6 4.1 4.1 4.1 3.9  CL 107 105 103 101 86*  CO2 27 31 32 32 36*  GLUCOSE 122* 172* 161* 155* 177*  BUN 50* 37* 26* 27* 41*    CREATININE 1.14 1.07 1.15 1.29* 1.80*  CALCIUM 9.1 9.0 8.7* 8.6* 8.6*  MG  --  2.1  --  1.9 2.0  PHOS  --   --   --   --  4.7*   GFR: Estimated Creatinine Clearance: 31.6 mL/min (A) (by C-G formula based on SCr of 1.8 mg/dL (H)). Liver Function Tests:  Recent Labs Lab 02/08/2017 0520  AST 29  ALT 57  ALKPHOS 73  BILITOT 0.8  PROT 6.8  ALBUMIN 2.9*   No results for input(s): LIPASE, AMYLASE in the last 168 hours. No results for input(s): AMMONIA in the last 168 hours. Coagulation Profile: No results for input(s): INR, PROTIME in the last 168 hours. Cardiac Enzymes: No results for input(s): CKTOTAL, CKMB, CKMBINDEX, TROPONINI in the last 168 hours. BNP (last 3 results) No results for input(s): PROBNP in the last 8760 hours. HbA1C: No results for input(s): HGBA1C in the last 72 hours. CBG:  Recent Labs Lab 01/29/17 1634 02/12/2017 0819 02/18/2017 1250 02/16/2017 1613  GLUCAP 133* 152* 137* 149*   Lipid Profile:  Recent Labs  02/11/2017 0520  TRIG 114   Thyroid Function Tests: No results for input(s): TSH, T4TOTAL, FREET4, T3FREE, THYROIDAB in the last 72 hours. Anemia Panel: No results for input(s): VITAMINB12, FOLATE, FERRITIN, TIBC, IRON, RETICCTPCT in the last 72 hours. Urine analysis: No results found for: COLORURINE, APPEARANCEUR, LABSPEC, [place], GLUCOSEU, HGBUR, BILIRUBINUR, KETONESUR, PROTEINUR, UROBILINOGEN, NITRITE, LEUKOCYTESUR Sepsis Labs: @LABRCNTIP (procalcitonin:4,lacticidven:4)  ) Recent Results (from the past 240 hour(s))  Surgical pcr screen     Status: Abnormal   Collection Time: 01/31/17 11:46 PM  Result Value Ref Range Status   MRSA, PCR NEGATIVE NEGATIVE Final   Staphylococcus aureus POSITIVE (A) NEGATIVE Final    Comment:        The Xpert SA Assay (FDA approved for NASAL specimens in patients over 69 years of age), is one component of a comprehensive surveillance program.  Test performance has been validated by [facility] for patients  greater than or equal to 43 year old. It is not intended to diagnose infection nor to guide or monitor treatment. RESULT CALLED TO, READ BACK BY AND VERIFIED WITH:  TETREAULT,H @ 8453 ON 02/21/2017 BY JUW          Radiology Studies: Ct Abdomen Pelvis W Contrast  Result Date: 01/31/2017 CLINICAL DATA:  Left renal carcinoma 2004 with partial nephrectomy. Severe small-bowel obstruction, nausea and vomiting. EXAM: CT ABDOMEN AND PELVIS WITH CONTRAST TECHNIQUE: Multidetector CT imaging of the abdomen and pelvis was performed using the standard protocol following bolus administration of intravenous  contrast. CONTRAST:  170mL ISOVUE-300 IOPAMIDOL (ISOVUE-300) INJECTION 61% COMPARISON:  01/02/2017 FINDINGS: Lower chest: Top normal size cardiac chambers. No pericardial effusion. Left basilar atelectasis with minimal bronchiectasis. Hepatobiliary: Homogeneous attenuation of the liver without space-occupying mass. Nondistended gallbladder with gallstones. Pancreas: Unremarkable. No pancreatic ductal dilatation or surrounding inflammatory changes. Spleen: Normal in size without focal abnormality. Adrenals/Urinary Tract: Stable right-sided renal cysts. Normal appearing adrenal glands. No obstructive uropathy. Unremarkable urinary bladder. No solid enhancing renal mass in either kidney. Stomach/Bowel: Gastric tube is seen within fluid-filled distended stomach, coiled back upon itself within the gastric antrum. Markedly dilated air and fluid-filled small bowel loops measuring up to 5.6 cm are again identified a transition point in the mid pelvis similar to that seen previously possibly related to an adhesion. Mid to distal ileal loops superior contracted. Large bowel is nondistended. There is no free air. Vascular/Lymphatic: Aortoiliac atherosclerosis. Opacification of the celiac axis, SMA and IMA. Reproductive: Mildly enlarged prostate up to 5.8 cm in maximum dimension. Other: No free fluid. Musculoskeletal: Left  iliopsoas bursal fluid containing ill-defined a amorphous calcifications possibly representing changes of osteochondral bodies from the left hip. Bilateral axial joint space narrowing of the hips. Postop change of the lumbar spine. IMPRESSION: 1. Relatively unchanged appearance of high-grade small bowel obstruction to the level of the mid ileum. Transition zone is less well-defined on current exam but noted on prior study to be within the mid upper pelvis. Gastric tube is seen within distended stomach slightly coiled back upon itself within the antrum. 2. Stable right-sided renal cysts. 3. Left iliopsoas bursal fluid with calcifications possibly representing osteochondral bodies. 4. Aortoiliac atherosclerosis. Electronically Signed   By: Ashley Royalty M.D.   On: 01/31/2017 22:22   Dg Abd Portable 1v  Result Date: 01/31/2017 CLINICAL DATA:  NG tube placement EXAM: PORTABLE ABDOMEN - 1 VIEW COMPARISON:  None. FINDINGS: There is significant gaseous distension of small bowel with progression from prior exam. In mid abdomen small bowel measures at least 7.3 cm in diameter with progression from prior exam. NG tube is noted coiled within proximal stomach with tip in proximal stomach lateral. Stable postsurgical changes lumbar spine. IMPRESSION: Significant diffuse gaseous distension of the small bowel up to 7.3 cm highly suspicious for small bowel obstruction. There is progression from prior exam. NG tube coiled within proximal stomach with tip in proximal stomach laterally. Electronically Signed   By: Lahoma Crocker M.D.   On: 01/31/2017 11:22        Scheduled Meds: . [START ON 02/02/2017] cefTRIAXone (ROCEPHIN)  IV  2 g Intravenous Once  . diclofenac sodium  2 g Topical Daily  . [START ON 02/02/2017] enoxaparin (LOVENOX) injection  40 mg Subcutaneous Q24H  . feeding supplement  1 Container Oral BID BM  . feeding supplement (PRO-STAT SUGAR FREE 64)  30 mL Oral BID  . insulin aspart  0-15 Units Subcutaneous Q4H  .  LORazepam  0.5 mg Intravenous QHS  . metronidazole  500 mg Intravenous Q8H  . mupirocin ointment   Nasal BID   Continuous Infusions: . dextrose 5 % and 0.45 % NaCl with KCl 20 mEq/L 60 mL/hr at 02/11/2017 1458  . Marland KitchenTPN (CLINIMIX-E) Adult     And  . fat emulsion    . Marland KitchenTPN (CLINIMIX-E) Adult 20 mL/hr at 01/31/2017 0900     LOS: 8 days    Time spent: 25 minutes. Greater than 50% of this time was spent in direct contact with the patient coordinating care.  Lelon Frohlich, MD [facility] Pager [phone]  If 7PM-7AM, please contact night-coverage [email or web address] Password [facility] 02/05/2017, 4:58 PM

## 2017-02-01 NOTE — Progress Notes (Signed)
PHARMACY - ADULT TOTAL PARENTERAL NUTRITION CONSULT NOTE   Pharmacy Consult for TPN Indication: Bowel obstruction, post op 4/5  Patient Measurements: Height: 6' (182.9 cm) Weight: 161 lb 6.4 oz (73.2 kg) IBW/kg (Calculated) : 77.6 TPN AdjBW (KG): 73.2 Body mass index is 21.89 kg/m.  Assessment: 83 year old male with history of hypertension, remote left kidney carcinoma status post partial nephrectomy, chronic bradycardia, recent back surgery who presented with abdominal pain for past 3 days. Denied any fevers, chills, nausea or vomiting. Denied dysuria or diarrhea.  Pt had surgery on 02/18/2017 for SBO  GI: Pt has gone approximately 10 days with minimal to no nutrition. At this time, TPN is warranted if patient shows intolerance to clear liquids, as he on advancement before.   Insulin requirements in the past 24 hours: none BMP Latest Ref Rng & Units 02/21/2017 01/30/2017 01/29/2017  Glucose 65 - 99 mg/dL 177(H) 155(H) 161(H)  BUN 6 - 20 mg/dL 41(H) 27(H) 26(H)  Creatinine 0.61 - 1.24 mg/dL 1.80(H) 1.29(H) 1.15  Sodium 135 - 145 mmol/L 132(L) 139 141  Potassium 3.5 - 5.1 mmol/L 3.9 4.1 4.1  Chloride 101 - 111 mmol/L 86(L) 101 103  CO2 22 - 32 mmol/L 36(H) 32 32  Calcium 8.9 - 10.3 mg/dL 8.6(L) 8.6(L) 8.7(L)   TPN Access: central IV access TPN start date: 01/31/17 Current Nutrition: none  Nutritional Goals (per RD recommendation on 01/30/2017): Estimated Nutritional Needs:  Kcal:  2050-2250 (28-31 kcal/kg bw) Protein:  88-102 g Pro (1.2-1.4 g/kg bw) Fluid:  >1.8 L (25 ml/kg bw)  Plan: will not advance TPN rate until sugars under better control Continue Clinimix E 5/15 at 97ml/hr, titrate to goal rate as tolerated 20% lipid emulsion at 5ml/hr (infuse over 12 hours) IVMF (D5 1/2NS with KCL 24meq/L) to 60 ml/hr when TPN starts, IVF orders per MD This provides 48 g of protein and 1162 kCals per day meeting ~50% of protein and ~53% of kCal needs MVI and trace elements in TPN Order SSI q4h  CBG checks Monitor TPN labs, renal fxn, fluid balance, glucose tolerance  [place] Crane, Jhalil Silvera A 02/18/2017,2:33 PM

## 2017-02-01 NOTE — Progress Notes (Signed)
Attempted to call Dr. Rosana Hoes to alert him of staph positive result on patient. Call is not going through, will inform oncoming nurse.

## 2017-02-01 NOTE — Progress Notes (Signed)
Patient seen and examined with no changes since yesterday's documentation. Will proceed with surgery today as scheduled.  -- Marilynne Drivers Rosana Hoes, MD, [place]: [facility] Office #: [phone]

## 2017-02-01 NOTE — Anesthesia Postprocedure Evaluation (Signed)
Anesthesia Post Note  Patient: Joshua Downs  Procedure(s) Performed: Procedure(s) (LRB): EXPLORATORY LAPAROTOMY (N/A) LYSIS OF ADHESION (N/A)  Patient location during evaluation: PACU Anesthesia Type: General Level of consciousness: awake and alert and oriented Pain management: pain level controlled Vital Signs Assessment: post-procedure vital signs reviewed and stable Respiratory status: spontaneous breathing Cardiovascular status: blood pressure returned to baseline Postop Assessment: adequate PO intake Anesthetic complications: no     Last Vitals:  Vitals:   02/08/2017 1315 02/11/2017 1330  BP: 126/88 107/63  Pulse: (!) 104 (!) 112  Resp: 19 15  Temp:      Last Pain:  Vitals:   01/28/2017 1315  TempSrc:   PainSc: 4                  [name]

## 2017-02-01 NOTE — Anesthesia Preprocedure Evaluation (Signed)
Anesthesia Evaluation  Patient identified by MRN, date of birth, ID band Patient awake    Reviewed: Allergy & Precautions, NPO status , Patient's Chart, lab work & pertinent test results  History of Anesthesia Complications Negative for: history of anesthetic complications  Airway Mallampati: I  TM Distance: >3 FB Neck ROM: Full    Dental  (+) Edentulous Upper, Edentulous Lower   Pulmonary neg pulmonary ROS, former smoker,    Pulmonary exam normal        Cardiovascular hypertension, Pt. on medications  Rhythm:regular Rate:Normal     Neuro/Psych    GI/Hepatic   Endo/Other    Renal/GU Renal InsufficiencyRenal disease     Musculoskeletal  (+) Arthritis ,   Abdominal   Peds  Hematology   Anesthesia Other Findings SBO on NG suction.  Reproductive/Obstetrics                             Anesthesia Physical Anesthesia Plan  ASA: III  Anesthesia Plan: General   Post-op Pain Management:    Induction: Intravenous, Rapid sequence and Cricoid pressure planned  Airway Management Planned: Oral ETT  Additional Equipment:   Intra-op Plan:   Post-operative Plan: Extubation in OR  Informed Consent: I have reviewed the patients History and Physical, chart, labs and discussed the procedure including the risks, benefits and alternatives for the proposed anesthesia with the patient or authorized representative who has indicated his/her understanding and acceptance.     Plan Discussed with:   Anesthesia Plan Comments:         Anesthesia Quick Evaluation

## 2017-02-01 NOTE — Op Note (Signed)
SURGICAL OPERATIVE REPORT  DATE OF PROCEDURE: 02/05/2017  ATTENDING Surgeon(s): Vickie Epley, MD  ANESTHESIA: GETA  PRE-OPERATIVE DIAGNOSIS: Complete small bowel obstruction secondary to internal hernia attributable to post-surgical adhesions (K56.52)  POST-OPERATIVE DIAGNOSIS: Complete small bowel obstruction secondary to internal hernia attributable to post-surgical adhesions (K56.52)  PROCEDURE(S):  1.) Exploratory laparotomy (cpt: 49000) 2.) Extensive sharp (scissors) lysis of dense fibrous intra-intestinal adhesions as well as adhesions to the Left anterior abdominal wall/peritoneum requiring 2.5 hours for just the lysis of adhesions, accounting for the majority of the procedure (cpt: 62694 #59 or #22) 3.) Repair of Left-sided enterotomies x4 (oversewn x3, stapled enteroplasty x1)  INTRAOPERATIVE FINDINGS: Small bowel obstruction likely secondary to mid-abdominal internal hernia associated with focal intra-mesenteric adhesive bands, additional extensive dense fibrous adhesions between indistinguishable loops of small intestine and between small intestine and the Left anterior abdominal wall overlying the site of patient's previous Left partial nephrectomy for malignancy  INTRAVENOUS FLUIDS: 3000 mL LR + 60 mL TPN  ESTIMATED BLOOD LOSS: 50 mL   URINE OUTPUT: 150 mL   SPECIMENS: None   IMPLANTS: None  DRAINS: None   COMPLICATIONS: None apparent   CONDITION AT END OF PROCEDURE: Hemodynamically stable and extubated   DISPOSITION OF PATIENT: PACU  INDICATIONS FOR PROCEDURE:  Patient is a 83 y.o. male who presented to [facility] ED with worsening abdominal pain, distention, nausea, and vomiting x4 days following spine surgery 3 weeks earlier, since which time he lost 25 lbs. After 36 hours, his pain resolved, patient passed a small BM, NG tube was removed, and patient's diet was advanced to clear liquids. Throughout the same day, however, he became distended with recurrent  abdominal pain, vomited, and NG tube was reinserted. At that time, patient denied having passed any flatus while in the hospital. Accordingly, NG tube was kept in for another 48 hours, after which patient began reporting "rumbling" and passed flatus the following day. Again, NG tube was discontinued, and patient resumed clear liquids diet. Despite ongoing flatus, both reported and observed, he again overnight became distended and vomited once. All risks, benefits, and alternatives to above procedure were discussed with the patient, all of patient's and his family's questions were answered to their expressed satisfaction, and informed consent was obtained and documented. PICC was inserted, and TPN was initiated.  DETAILS OF PROCEDURE: Patient was brought to the operating suite and appropriately identified. General anesthesia was administered along with appropriate pre-operative antibiotics, endotracheal intubation was performed by anesthetist, and Foley catheter was placed. In supine position, operative site was prepped and draped in the usual sterile fashion, and following a brief time out, a vertical incision was made in the midline above and below the umbilicus and to the Left of the umbilicus using a #85 blade scalpel and extended deep through subcutaneous tissues to expose fascia, which was divided between the rectus abdominal muscles using cutting electrocautery. Upon dissecting through preperitoneal fat, the peritoneum was entered and opened along the length of the incision, taking care to avoid injury to underlying dilated bowel.  Several loops of dilated mid-abdominal small intestine were externalized between which was found to be intramesentary dense fibrous adhesions, around which a segment of small intestine was wrapped, consistent with the transition point visualized on CT imaging. These adhesions were sharply lysed, and the internal herniation was released. Some additional adhesiolysis was  performed near this location, and bowel was able to be evaluated proximally to ligament of Treitz. The liquid and gaseous contents of this dilated  small intestine were carefully expressed proximally into the stomach, where it was confirmed to be evacuated by continuous nasogastric suction with the tip of the NG tube confirmed to be within the stomach. However, 1 - 3 feet distal to the relieved internal hernia was a solid block of dense fibrous adhesions within several loops of small intestine and between those loops of small intestine and the anterior and Left lateral abdominal peritoneum overlying the patient's prior Left partial nephrectomy performed for malignancy. The remainder of the abdomen was explored with no additional findings.  This second area of densely adherent small intestine and peritoneum was then meticulously and painstakingly sharply dissected from adjacent loops of small intestine, followed by attempted dissection of the peritoneum from the indistinguishable underlying small intestine. This slow and steady systematic sharp scissor lysis of adhesions continued, accounting for 2.5 hours of the 3 hour procedural time, taking care to avoid injury to small intestine or corresponding mesentery. During lysis of adhesions, however, 4 enterotomies were noted including 3 along a single short segment of smal intestine; these enterotomies were able to be oversewn using 3-0 silk sutures in Lembert fashion. It appeared, however, that primary repair of the fourth enterotomy along an adjacent segment of small intestine would cause stenosis of that segment, so a GIA-55 linear cutting stapler was advanced through the limbs of intestine proximal and distal to the enterotomy and fired to create a widely patent common channel. The opening of the enterotomy was then repaired by firing a second load of the GIA-55 linear cutting stapler across the enterotomy's opening, and the staple line was oversewn using 3-0 silk  Lembert sutures, over which omentum was placed following final inspection to confirm a viable hemostatic and widely patent anastomosis without tension. Further segments of small intestine were systematically freed by extensive sharp lysis of adhesions until these final dense fibrotic Left-sided small intestinal adhesions were likewise released from each other and peritoneum. All of the tissues evaluated were clearly viable and hemostatic upon close inspection.  The abdominal wall was then re-approximated in layers with #1 looped PDS sutures from the top and bottom of the incision, and the subcutaneous incision was copiously irrigated with sterile saline. Exparel (72-hour release liposomal formulation of bupivicane) was injected into the fascia and subcutaneously, and surgical skin staples were then used to re-approximate skin. Skin was then cleaned and dried, and a sterile dressing was applied. Patient was then safely able to be extubated, awakened, and transferred to PACU for post-operative monitoring and care.  I was present for all aspects of the above procedure, and there were no complications apparent.

## 2017-02-02 ENCOUNTER — Encounter (HOSPITAL_COMMUNITY): Payer: Self-pay | Admitting: Surgery

## 2017-02-02 LAB — GLUCOSE, CAPILLARY
GLUCOSE-CAPILLARY: 160 mg/dL — AB (ref 65–99)
GLUCOSE-CAPILLARY: 136 mg/dL — AB (ref 65–99)
Glucose-Capillary: 163 mg/dL — ABNORMAL HIGH (ref 65–99)
Glucose-Capillary: 125 mg/dL — ABNORMAL HIGH (ref 65–99)
Glucose-Capillary: 116 mg/dL — ABNORMAL HIGH (ref 65–99)
Glucose-Capillary: 142 mg/dL — ABNORMAL HIGH (ref 65–99)

## 2017-02-02 LAB — COMPREHENSIVE METABOLIC PANEL
ALBUMIN: 2.2 g/dL — AB (ref 3.5–5.0)
ALK PHOS: 52 U/L (ref 38–126)
ALT: 33 U/L (ref 17–63)
AST: 20 U/L (ref 15–41)
Anion gap: 8 (ref 5–15)
BILIRUBIN TOTAL: 0.7 mg/dL (ref 0.3–1.2)
BUN: 43 mg/dL — AB (ref 6–20)
CALCIUM: 7.8 mg/dL — AB (ref 8.9–10.3)
CO2: 32 mmol/L (ref 22–32)
Chloride: 92 mmol/L — ABNORMAL LOW (ref 101–111)
Creatinine, Ser: 2 mg/dL — ABNORMAL HIGH (ref 0.61–1.24)
GFR calc Af Amer: 34 mL/min — ABNORMAL LOW (ref >60)
GFR calc non Af Amer: 29 mL/min — ABNORMAL LOW (ref >60)
GLUCOSE: 170 mg/dL — AB (ref 65–99)
Potassium: 4.7 mmol/L (ref 3.5–5.1)
Sodium: 132 mmol/L — ABNORMAL LOW (ref 135–145)
TOTAL PROTEIN: 5.5 g/dL — AB (ref 6.5–8.1)

## 2017-02-02 LAB — MAGNESIUM: Magnesium: 1.6 mg/dL — ABNORMAL LOW (ref 1.7–2.4)

## 2017-02-02 LAB — PHOSPHORUS: Phosphorus: 4.8 mg/dL — ABNORMAL HIGH (ref 2.5–4.6)

## 2017-02-02 MED ORDER — LACTATED RINGERS IV SOLN
INTRAVENOUS | Status: DC
  Administered 2017-02-02 – 2017-02-04 (×3): via INTRAVENOUS

## 2017-02-02 MED ORDER — SODIUM CHLORIDE 0.9 % IV BOLUS (SEPSIS)
1000.0000 mL | Freq: Once | INTRAVENOUS | Status: AC
  Administered 2017-02-02: 1000 mL via INTRAVENOUS

## 2017-02-02 MED ORDER — LACTATED RINGERS IV BOLUS (SEPSIS)
1000.0000 mL | Freq: Once | INTRAVENOUS | Status: AC
Start: 2017-02-02 — End: 2017-02-02
  Administered 2017-02-02: 1000 mL via INTRAVENOUS

## 2017-02-02 MED ORDER — FAT EMULSION 20 % IV EMUL
250.0000 mL | INTRAVENOUS | Status: AC
  Administered 2017-02-02: 250 mL via INTRAVENOUS
  Filled 2017-02-02: qty 250

## 2017-02-02 MED ORDER — INSULIN ASPART 100 UNIT/ML ~~LOC~~ SOLN
0.0000 [IU] | Freq: Four times a day (QID) | SUBCUTANEOUS | Status: DC
  Administered 2017-02-02 – 2017-02-03 (×2): 2 [IU] via SUBCUTANEOUS
  Administered 2017-02-03 (×2): 3 [IU] via SUBCUTANEOUS
  Administered 2017-02-03 – 2017-02-04 (×2): 2 [IU] via SUBCUTANEOUS
  Administered 2017-02-04 (×2): 3 [IU] via SUBCUTANEOUS
  Administered 2017-02-04: 2 [IU] via SUBCUTANEOUS
  Administered 2017-02-05: 3 [IU] via SUBCUTANEOUS
  Administered 2017-02-05: 2 [IU] via SUBCUTANEOUS
  Administered 2017-02-05: 3 [IU] via SUBCUTANEOUS
  Administered 2017-02-05 – 2017-02-06 (×2): 2 [IU] via SUBCUTANEOUS
  Administered 2017-02-06: 3 [IU] via SUBCUTANEOUS

## 2017-02-02 MED ORDER — TRACE MINERALS CR-CU-MN-SE-ZN 10-1000-500-60 MCG/ML IV SOLN
60.0000 mL/h | INTRAVENOUS | Status: AC
  Administered 2017-02-02: 19:00:00 via INTRAVENOUS
  Filled 2017-02-02: qty 1440

## 2017-02-02 MED ORDER — ENOXAPARIN SODIUM 30 MG/0.3ML ~~LOC~~ SOLN
30.0000 mg | SUBCUTANEOUS | Status: DC
Start: 2017-02-03 — End: 2017-02-03
  Administered 2017-02-03: 30 mg via SUBCUTANEOUS
  Filled 2017-02-02: qty 0.3

## 2017-02-02 NOTE — Progress Notes (Signed)
Spoke to Dr. Rosana Hoes about care of patient. MD wanted fluids to be running to have a total input of 125 ml an hour.  A bag of LR was hung according to MD order to total 125 with patient's tpn.  NG tube not discontinued or foley not discontinued per MD.

## 2017-02-02 NOTE — Progress Notes (Addendum)
PROGRESS NOTE    Joshua Downs:[id number] DOB: [birth year] DOA: 01/01/2017 PCP: Deloria Lair, MD     Brief Narrative:  83 year old man initially admitted to the hospital on 3/28 due to abdominal pain and found to have a small bowel obstruction. CT scan of the abdomen showed a high-grade small bowel obstruction with transition point in the mid abdomen. Was also noted to have acute kidney injury. NG tube was placed in the emergency department, surgical services were consulted. NG tube was initially removed on 3/30 and was reinserted on the same day due to recurrence of distention. NG tube was again removed on 4/3 due to patient's symptoms improving, unfortunately he has failed again with increased vomiting and NG tube has been replaced on 4/4. OR on 4/5 for ex lap with lysis of adhesions.   Assessment & Plan:   Principal Problem:   Small bowel obstruction Active Problems:   Acute renal failure (ARF) (HCC)   Hyperglycemia   Protein-calorie malnutrition, severe   Small bowel obstruction -He has failed conservative management and went to OR on 4/5 for ex lap and lysis of adhesions. -Follow surgical recommendations: keep NG in today. -Continue TPN. -Has received a couple boluses of IVF today due to hypotension and elevated HR, likely due to prolonged NPO state and fluid losses during surgery. -Is currently receiving TPN.  Severe protein caloric malnutrition -Continue TPN. -Appreciate dietitian and pharmacy recommendations.  Hypernatremia -Resolved.  Hypertension -By mouth meds on hold given nothing by mouth state. -BP low normal today.  Acute on CKD Stage III -Baseline Cr aroung 1.1-1.2. -Cr is 2.0 today; suspect due to prerenal azotemia. -Increase IVF. -Recheck renal function in am.   DVT prophylaxis: Lovenox Code Status: Full code Family Communication: Patient only Disposition Plan: To be determined pending medical stability.  Consultants:   Surgery, Dr.  Rosana Hoes  Procedures:   None  Antimicrobials:  Anti-infectives    Start     Dose/Rate Route Frequency Ordered Stop   02/02/17 0900  cefTRIAXone (ROCEPHIN) 2 g in dextrose 5 % 50 mL IVPB     2 g 100 mL/hr over 30 Minutes Intravenous  Once 02/26/2017 1429 02/02/17 0930   02/08/2017 1730  metroNIDAZOLE (FLAGYL) IVPB 500 mg     500 mg 100 mL/hr over 60 Minutes Intravenous Every 8 hours 02/08/2017 1429 02/02/17 0325   02/08/2017 0822  cefTRIAXone (ROCEPHIN) 2 g in dextrose 5 % 50 mL IVPB     2 g 100 mL/hr over 30 Minutes Intravenous On call to O.R. 01/29/2017 4401 01/31/2017 0920   02/17/2017 0272  metroNIDAZOLE (FLAGYL) IVPB 500 mg  Status:  Discontinued     500 mg 100 mL/hr over 60 Minutes Intravenous Every 8 hours 01/28/2017 0822 02/23/2017 1330       Subjective: Is hungry, has some abdominal pain especially with movement or coughing.  Objective: Vitals:   02/02/17 0403 02/02/17 0930 02/02/17 1116 02/02/17 1300  BP: (!) 91/53 (!) 101/53 106/63 95/60  Pulse: (!) 119 (!) 123 (!) 123 (!) 131  Resp: 12   18  Temp: 98.9 F (37.2 C) 100 F (37.8 C)  98.2 F (36.8 C)  TempSrc: Oral Oral  Oral  SpO2: 98% 97% 98% 98%  Weight:      Height:        Intake/Output Summary (Last 24 hours) at 02/02/17 1640 Last data filed at 02/02/17 1559  Gross per 24 hour  Intake  1413.33 ml  Output             1595 ml  Net          -181.67 ml   Filed Weights   01/22/2017 1204 01/18/2017 1807  Weight: 76.2 kg (168 lb) 73.2 kg (161 lb 6.4 oz)    Examination:  General exam: Alert, awake, oriented x 3 Respiratory system: Clear to auscultation. Respiratory effort normal. Cardiovascular system:RRR. No murmurs, rubs, gallops. Gastrointestinal system: Abdomen is distended, hypoactive bowel sounds heard. Central nervous system: Alert and oriented. No focal neurological deficits. Extremities: No C/C/E, +pedal pulses Skin: No rashes, lesions or ulcers Psychiatry: Judgement and insight appear normal. Mood &  affect appropriate.     Data Reviewed: I have personally reviewed following labs and imaging studies  CBC:  Recent Labs Lab 01/28/2017 0520  WBC 9.7  NEUTROABS 7.1  HGB 14.6  HCT 43.4  MCV 86.3  PLT 161   Basic Metabolic Panel:  Recent Labs Lab 01/28/17 0636 01/29/17 0443 01/30/17 0553 02/23/2017 0520 02/02/17 0518  NA 144 141 139 132* 132*  K 4.1 4.1 4.1 3.9 4.7  CL 105 103 101 86* 92*  CO2 31 32 32 36* 32  GLUCOSE 172* 161* 155* 177* 170*  BUN 37* 26* 27* 41* 43*  CREATININE 1.07 1.15 1.29* 1.80* 2.00*  CALCIUM 9.0 8.7* 8.6* 8.6* 7.8*  MG 2.1  --  1.9 2.0 1.6*  PHOS  --   --   --  4.7* 4.8*   GFR: Estimated Creatinine Clearance: 28.5 mL/min (A) (by C-G formula based on SCr of 2 mg/dL (H)). Liver Function Tests:  Recent Labs Lab 02/14/2017 0520 02/02/17 0518  AST 29 20  ALT 57 33  ALKPHOS 73 52  BILITOT 0.8 0.7  PROT 6.8 5.5*  ALBUMIN 2.9* 2.2*   No results for input(s): LIPASE, AMYLASE in the last 168 hours. No results for input(s): AMMONIA in the last 168 hours. Coagulation Profile: No results for input(s): INR, PROTIME in the last 168 hours. Cardiac Enzymes: No results for input(s): CKTOTAL, CKMB, CKMBINDEX, TROPONINI in the last 168 hours. BNP (last 3 results) No results for input(s): PROBNP in the last 8760 hours. HbA1C: No results for input(s): HGBA1C in the last 72 hours. CBG:  Recent Labs Lab 02/02/17 0118 02/02/17 0400 02/02/17 0800 02/02/17 1129 02/02/17 1635  GLUCAP 160* 163* 125* 116* 142*   Lipid Profile:  Recent Labs  02/12/2017 0520  TRIG 114   Thyroid Function Tests: No results for input(s): TSH, T4TOTAL, FREET4, T3FREE, THYROIDAB in the last 72 hours. Anemia Panel: No results for input(s): VITAMINB12, FOLATE, FERRITIN, TIBC, IRON, RETICCTPCT in the last 72 hours. Urine analysis: No results found for: COLORURINE, APPEARANCEUR, LABSPEC, PHURINE, GLUCOSEU, HGBUR, BILIRUBINUR, KETONESUR, PROTEINUR, UROBILINOGEN, NITRITE,  LEUKOCYTESUR Sepsis Labs: @LABRCNTIP (procalcitonin:4,lacticidven:4)  ) Recent Results (from the past 240 hour(s))  Surgical pcr screen     Status: Abnormal   Collection Time: 01/31/17 11:46 PM  Result Value Ref Range Status   MRSA, PCR NEGATIVE NEGATIVE Final   Staphylococcus aureus POSITIVE (A) NEGATIVE Final    Comment:        The Xpert SA Assay (FDA approved for NASAL specimens in patients over 31 years of age), is one component of a comprehensive surveillance program.  Test performance has been validated by [facility] for patients greater than or equal to 5 year old. It is not intended to diagnose infection nor to guide or monitor treatment. RESULT CALLED TO, READ BACK  BY AND VERIFIED WITH:  TETREAULT,H @ 2035 ON 02/06/2017 BY JUW          Radiology Studies: Ct Abdomen Pelvis W Contrast  Result Date: 01/31/2017 CLINICAL DATA:  Left renal carcinoma 2004 with partial nephrectomy. Severe small-bowel obstruction, nausea and vomiting. EXAM: CT ABDOMEN AND PELVIS WITH CONTRAST TECHNIQUE: Multidetector CT imaging of the abdomen and pelvis was performed using the standard protocol following bolus administration of intravenous contrast. CONTRAST:  147mL ISOVUE-300 IOPAMIDOL (ISOVUE-300) INJECTION 61% COMPARISON:  01/07/2017 FINDINGS: Lower chest: Top normal size cardiac chambers. No pericardial effusion. Left basilar atelectasis with minimal bronchiectasis. Hepatobiliary: Homogeneous attenuation of the liver without space-occupying mass. Nondistended gallbladder with gallstones. Pancreas: Unremarkable. No pancreatic ductal dilatation or surrounding inflammatory changes. Spleen: Normal in size without focal abnormality. Adrenals/Urinary Tract: Stable right-sided renal cysts. Normal appearing adrenal glands. No obstructive uropathy. Unremarkable urinary bladder. No solid enhancing renal mass in either kidney. Stomach/Bowel: Gastric tube is seen within fluid-filled distended stomach, coiled  back upon itself within the gastric antrum. Markedly dilated air and fluid-filled small bowel loops measuring up to 5.6 cm are again identified a transition point in the mid pelvis similar to that seen previously possibly related to an adhesion. Mid to distal ileal loops superior contracted. Large bowel is nondistended. There is no free air. Vascular/Lymphatic: Aortoiliac atherosclerosis. Opacification of the celiac axis, SMA and IMA. Reproductive: Mildly enlarged prostate up to 5.8 cm in maximum dimension. Other: No free fluid. Musculoskeletal: Left iliopsoas bursal fluid containing ill-defined a amorphous calcifications possibly representing changes of osteochondral bodies from the left hip. Bilateral axial joint space narrowing of the hips. Postop change of the lumbar spine. IMPRESSION: 1. Relatively unchanged appearance of high-grade small bowel obstruction to the level of the mid ileum. Transition zone is less well-defined on current exam but noted on prior study to be within the mid upper pelvis. Gastric tube is seen within distended stomach slightly coiled back upon itself within the antrum. 2. Stable right-sided renal cysts. 3. Left iliopsoas bursal fluid with calcifications possibly representing osteochondral bodies. 4. Aortoiliac atherosclerosis. Electronically Signed   By: Ashley Royalty M.D.   On: 01/31/2017 22:22        Scheduled Meds: . diclofenac sodium  2 g Topical Daily  . [START ON 02/03/2017] enoxaparin (LOVENOX) injection  30 mg Subcutaneous Q24H  . insulin aspart  0-15 Units Subcutaneous Q4H  . LORazepam  0.5 mg Intravenous QHS  . mupirocin ointment   Nasal BID  . sodium chloride  1,000 mL Intravenous Once   Continuous Infusions: . Marland KitchenTPN (CLINIMIX-E) Adult     And  . fat emulsion    . lactated ringers 85 mL/hr at 02/02/17 1115  . Marland KitchenTPN (CLINIMIX-E) Adult 40 mL/hr at 02/21/2017 1806     LOS: 9 days    Time spent: 25 minutes. Greater than 50% of this time was spent in direct  contact with the patient coordinating care.     Lelon Frohlich, MD [facility] Pager [phone]  If 7PM-7AM, please contact night-coverage [email or web address] Password [email or web address] 02/02/2017, 4:40 PM

## 2017-02-02 NOTE — Care Management Important Message (Signed)
Important Message  Patient Details  Name: CASPAR FAVILA MRN: [id number] Date of Birth: 1930-12-12   Medicare Important Message Given:  Yes    Sherald Barge, RN 02/02/2017, 10:40 AM

## 2017-02-02 NOTE — Plan of Care (Signed)
Problem: Nutrition: Goal: Adequate nutrition will be maintained Outcome: Progressing Pt currently NPO with TPN and Lipids infusing.

## 2017-02-02 NOTE — Anesthesia Postprocedure Evaluation (Signed)
Anesthesia Post Note  Patient: Joshua Downs  Procedure(s) Performed: Procedure(s) (LRB): EXPLORATORY LAPAROTOMY (N/A) LYSIS OF ADHESION (N/A)  Patient location during evaluation: Nursing Unit Anesthesia Type: General Level of consciousness: awake and alert, oriented and patient cooperative Pain management: pain level controlled Vital Signs Assessment: post-procedure vital signs reviewed and stable Respiratory status: spontaneous breathing, patient connected to nasal cannula oxygen, nonlabored ventilation and respiratory function stable Cardiovascular status: blood pressure returned to baseline Postop Assessment: no signs of nausea or vomiting Anesthetic complications: no     Last Vitals:  Vitals:   02/02/17 0403 02/02/17 0930  BP: (!) 91/53 (!) 101/53  Pulse: (!) 119 (!) 123  Resp: 12   Temp: 37.2 C 37.8 C    Last Pain:  Vitals:   02/02/17 0930  TempSrc: Oral  PainSc:                  Isma Tietje J

## 2017-02-02 NOTE — Progress Notes (Signed)
Dr. Jerilee Hoh paged and made aware of pt's BP 90/60's and HR 130's. New orders received will continue to monitor.

## 2017-02-02 NOTE — Evaluation (Signed)
Physical Therapy Re-Evaluation Patient Details Name: Joshua Downs MRN: [id number] DOB: 1931-06-24 Today's Date: 02/02/2017   History of Present Illness  83 y.o. male with medical history significant of macular degeneration; HTN; remote left kidney CA s/p partial nephrectomy; chronic bradycardia; and recent back surgery presenting with abdominal pain.  He came by his PCP office on Monday and tried to get an appointment - his abdomen was swelling, no BMs, no UOP, n/v.  Symptoms started Saturday night into Sunday morning.  He was seen today and they sent him to the ER.  Started with periumbilical pain - fleeting but would take your breath.  Got to the point where he didn't want to eat/drink.  Black emesis.  Last night about 0100, vomited more black emesis.  Last BM was Saturday AM and was normal.  No fevers.  Unable to pee up to once a day right now.  Last void was last night.  No burning with urination.  Abdominal surgeries: hernia; renal cancer.  CT: Severe high-grade SBO with abrupt transition point in the mid-abdomen.  02/02/17 Pt is now s/p exploratory laparotomy with lysis of adhesions and repair of Left-sided enterotomies x4 for refractory small bowel obstruction secondary to internal hernia associated with post-surgical adhesions following remote hand-assisted Left partial nephrectomy for malignancy, complicated by small-moderate amount of narcotics for post-surgical pain s/p recent spine surgery.    Clinical Impression  Pt received in bed, wife, and sister both present, and pt is agreeable to PT re-evaluation.  Pt was educated on log roll technique to reduce pain with transfer supine<>sit.  He required Mod A to perform this transfer.  Once sitting on the EOB, he requires increased time to scoot forward and get feet flat of the floor.  He required Mod A for transfer sit<>Stand with RW.  He was only able to transfer bed<>chair today due to increased fatigue.  While sitting in the chair, he was able to go  through a few exercises.  Encouraged to to mobilize with the nursing staff and sit up in the chair at meal times.  Depending on his progress, he may benefit from SNF at d/c.     Follow Up Recommendations Home health PT;SNF (Pending pt's progress)    Equipment Recommendations  None recommended by PT    Recommendations for Other Services       Precautions / Restrictions Precautions Precautions: Fall Precaution Comments: LOG ROLL for bed mobility.  Recent back surgery on the 01/04/2017 - Left LUMBAR ONE - LUMBAR TWO  LAMINOTOMY AND MICRODISCECTOMY, and now s/p abdominal ex lap Restrictions Weight Bearing Restrictions: No      Mobility  Bed Mobility Overal bed mobility: Needs Assistance Bed Mobility: Rolling Rolling: Mod assist Sidelying to sit: Mod assist       General bed mobility comments: Pt instructed on using log roll for supine<>sidelying<>sit.  Increased time for pt to scoot to the EOB.    Transfers Overall transfer level: Needs assistance Equipment used: Rolling walker (2 wheeled) Transfers: Sit to/from [facility] Sit to Stand: Mod assist Stand pivot transfers: Min assist       General transfer comment: increased time and vc's for hand placement.  Ambulation/Gait Ambulation/Gait assistance:  (NA - due to fatigue)              Stairs            Wheelchair Mobility    Modified Rankin (Stroke Patients Only)       Balance Overall  balance assessment: Needs assistance Sitting-balance support: Feet supported;Bilateral upper extremity supported Sitting balance-Leahy Scale: Fair     Standing balance support: Bilateral upper extremity supported Standing balance-Leahy Scale: Fair                               Pertinent Vitals/Pain      Home Living   Living Arrangements: Children (dtr - home most of the time.  )   Type of Home: House Home Access: Stairs to enter   Entrance Stairs-Number of Steps: front - 2, and  4-5 on the back that aren't used much.  Home Layout: One level Home Equipment: Cane - single point;Walker - 2 wheels;Bedside commode;Shower seat (lift chair)      Prior Function     Gait / Transfers Assistance Needed: Pt uses the cane most of the time due to balance due to back surgeries.  Still driving, Unlimited community ambulation.   ADL's / Homemaking Assistance Needed: independent        Hand Dominance   Dominant Hand: Right    Extremity/Trunk Assessment   Upper Extremity Assessment Upper Extremity Assessment: Generalized weakness;LUE deficits/detail LUE Deficits / Details: shoulder with chronic weakness due to previous injury.     Lower Extremity Assessment Lower Extremity Assessment: Generalized weakness       Communication   Communication: No difficulties  Cognition Arousal/Alertness: Awake/alert Behavior During Therapy: WFL for tasks assessed/performed Overall Cognitive Status: Within Functional Limits for tasks assessed                                        General Comments      Exercises General Exercises - Upper Extremity Shoulder Flexion: Strengthening;Both;10 reps;Seated Shoulder Flexion Limitations: L shoulder within available range.  General Exercises - Lower Extremity Gluteal Sets: Strengthening;Both;10 reps;Seated Long Arc Quad: Strengthening;Both;10 reps;Seated Hip Flexion/Marching: Strengthening;Both;10 reps;Seated   Assessment/Plan    PT Assessment Patient needs continued PT services  PT Problem List Decreased strength;Decreased activity tolerance;Decreased balance;Decreased mobility;Decreased skin integrity;Pain       PT Treatment Interventions DME instruction;Gait training;Functional mobility training;Therapeutic activities;Therapeutic exercise;Balance training;Patient/family education    PT Goals (Current goals can be found in the Care Plan section)  Acute Rehab PT Goals Patient Stated Goal: To get stronger and go  back home.  PT Goal Formulation: With patient Time For Goal Achievement: 02/07/17 Potential to Achieve Goals: Good    Frequency Min 3X/week   Barriers to discharge        Co-evaluation               End of Session Equipment Utilized During Treatment: Gait belt Activity Tolerance: Patient limited by fatigue Patient left: in chair;with call bell/phone within reach;with family/visitor present Nurse Communication: Mobility status PT Visit Diagnosis: Muscle weakness (generalized) (M62.81);Other abnormalities of gait and mobility (R26.89)    Time: 9983-3825 PT Time Calculation (min) (ACUTE ONLY): 32 min   Charges:   PT Evaluation $PT Re-evaluation: 1 Procedure PT Treatments $Therapeutic Activity: 8-22 mins   PT G Codes:        Beth Klayton Monie, PT, DPT X: [phone]

## 2017-02-02 NOTE — Progress Notes (Signed)
Progress Hospital Day(s): 9.   Post op day(s): 1 Day Post-Op.   Interval History: Patient seen and examined, no acute events or new complaints overnight. Patient reports his pain as "sore" but "tolerable" and worse when coughing, denies N/V, fever/chills, CP, or SOB. He also describes that he felt a little disoriented this morning following surgery yesterday, though recognizes he is in a hospital and his reason for being at a hospital. As expected, no flatus or BM yet.  Review of Systems:  Constitutional: denies fever, chills  HEENT: denies cough or congestion  Respiratory: denies any shortness of breath  Cardiovascular: denies chest pain or palpitations  Gastrointestinal: abdominal pain, N/V, and bowel function as per interval history Genitourinary: denies burning with urination or urinary frequency Musculoskeletal: denies pain, decreased motor or sensation Integumentary: denies any other rashes or skin discolorations Neurological: denies HA or vision/hearing changes   Vital signs in last 24 hours: [min-max] current  Temp:  [97.7 F (36.5 C)-99.1 F (37.3 C)] 98.9 F (37.2 C) (04/06 0403) Pulse Rate:  [86-133] 119 (04/06 0403) Resp:  [12-23] 12 (04/06 0403) BP: (91-144)/(51-88) 91/53 (04/06 0403) SpO2:  [93 %-100 %] 98 % (04/06 0403)     Height: 6' (182.9 cm) Weight: 73.2 kg (161 lb 6.4 oz) BMI (Calculated): 21.9   Intake/Output this shift:  No intake/output data recorded.   Intake/Output last 2 shifts:  @IOLAST2SHIFTS @   Physical Exam:  Constitutional: alert, cooperative and no distress  HENT: normocephalic without obvious abnormality  Eyes: PERRL, EOM's grossly intact and symmetric  Neuro: CN II - XII grossly intact and symmetric without deficit  Respiratory: breathing non-labored at rest  Cardiovascular: regular rate and sinus rhythm  Gastrointestinal: soft, appropriately moderate peri-incisional tenderness to palpation, and non-distended with incision  well-approximated without erythema or drainage Musculoskeletal: UE and LE FROM, no edema or wounds, motor and sensation grossly intact, NT   Labs:  CBC Latest Ref Rng & Units 02/03/2017 01/25/2017 01/04/2017  WBC 4.0 - 10.5 K/uL 9.7 4.3 5.9  Hemoglobin 13.0 - 17.0 g/dL 14.6 13.3 14.9  Hematocrit 39.0 - 52.0 % 43.4 39.6 43.3  Platelets 150 - 400 K/uL 286 253 296   CMP Latest Ref Rng & Units 02/02/2017 02/18/2017 01/30/2017  Glucose 65 - 99 mg/dL 170(H) 177(H) 155(H)  BUN 6 - 20 mg/dL 43(H) 41(H) 27(H)  Creatinine 0.61 - 1.24 mg/dL 2.00(H) 1.80(H) 1.29(H)  Sodium 135 - 145 mmol/L 132(L) 132(L) 139  Potassium 3.5 - 5.1 mmol/L 4.7 3.9 4.1  Chloride 101 - 111 mmol/L 92(L) 86(L) 101  CO2 22 - 32 mmol/L 32 36(H) 32  Calcium 8.9 - 10.3 mg/dL 7.8(L) 8.6(L) 8.6(L)  Total Protein 6.5 - 8.1 g/dL 5.5(L) 6.8 -  Total Bilirubin 0.3 - 1.2 mg/dL 0.7 0.8 -  Alkaline Phos 38 - 126 U/L 52 73 -  AST 15 - 41 U/L 20 29 -  ALT 17 - 63 U/L 33 57 -    Imaging studies: No new pertinent imaging studies   Assessment/Plan:  83 y.o. male doing overall okay, but continues to appear significantly hypovolemic as he appeared likewise during surgery yesterday, now POD #1 s/p exploratory laparotomy with lysis of adhesions and repair of Left-sided enterotomies x4 for refractory small bowel obstruction secondary to internal hernia associated with post-surgical adhesions following remote hand-assisted Left partial nephrectomy for malignancy, complicated by small-moderate amount of narcotics for post-surgical pain s/p recent spine surgery and bypertinent comorbidities including HTN, chronic bradycardia with Right BBB,  BPH, and osteoarthritis with chronic back pain.   - NPO, TPN, IVF             - pain control as needed - NG tube for nasogastric decompression  - okay with a few ice chips and losenges for comfort - monitor ongoing bowel function and abdominal exam  - 1L bolus LR + maintenance  fluids to total 125 mL/hr including TPN  - keep foley today for strict monitoring of urine output considering hypovolemia - will likely plan to remove nasogastric tube +/- start CLD by 4/8 - ambulation and incentive spirometry encouraged - medical management as per medical team - DVT prophylaxis  All of the above findings and recommendations were discussed with the patient and his family, and all of patient's and his family's questions were answered to their expressed satisfaction.  -- Marilynne Drivers Rosana Hoes, MD, [place]: [facility] Office: [phone]

## 2017-02-02 NOTE — Addendum Note (Signed)
Addendum  created 02/02/17 1049 by Charmaine Downs, CRNA   Sign clinical note

## 2017-02-02 NOTE — Progress Notes (Signed)
PHARMACY - ADULT TOTAL PARENTERAL NUTRITION CONSULT NOTE   Pharmacy Consult for TPN Indication: Bowel obstruction, post op 4/5  Patient Measurements: Height: 6' (182.9 cm) Weight: 161 lb 6.4 oz (73.2 kg) IBW/kg (Calculated) : 77.6 TPN AdjBW (KG): 73.2 Body mass index is 21.89 kg/m.  Assessment: 83 year old male with history of hypertension, remote left kidney carcinoma status post partial nephrectomy, chronic bradycardia, recent back surgery who presented with abdominal pain for past 3 days. Denied any fevers, chills, nausea or vomiting. Denied dysuria or diarrhea.  Pt had surgery on 02/10/2017 for SBO. Slight increase in Scr noted. Glucose control improving.  GI: Pt has gone approximately 10 days with minimal to no nutrition. At this time, TPN is warranted if patient shows intolerance to clear liquids, as he on advancement before.   Insulin requirements in the past 24 hours: none BMP Latest Ref Rng & Units 02/02/2017 02/11/2017 01/30/2017  Glucose 65 - 99 mg/dL 170(H) 177(H) 155(H)  BUN 6 - 20 mg/dL 43(H) 41(H) 27(H)  Creatinine 0.61 - 1.24 mg/dL 2.00(H) 1.80(H) 1.29(H)  Sodium 135 - 145 mmol/L 132(L) 132(L) 139  Potassium 3.5 - 5.1 mmol/L 4.7 3.9 4.1  Chloride 101 - 111 mmol/L 92(L) 86(L) 101  CO2 22 - 32 mmol/L 32 36(H) 32  Calcium 8.9 - 10.3 mg/dL 7.8(L) 8.6(L) 8.6(L)   TPN Access: central IV access TPN start date: 01/31/17 Current Nutrition: none  Nutritional Goals (per RD recommendation on 01/30/2017): Estimated Nutritional Needs:  Kcal:  2050-2250 (28-31 kcal/kg bw) Protein:  88-102 g Pro (1.2-1.4 g/kg bw) Fluid:  >1.8 L (25 ml/kg bw)  Plan: Advance TPN rate Increase Clinimix E 5/15 to 2ml/hr, titrate to goal rate as tolerated 20% lipid emulsion at 89ml/hr (infuse over 12 hours) Total IV fluid rate 173mls/hr (D5 1/2NS with KCL 9meq/L) to 65 ml/hr when TPN starts, IVF orders per MD for total fluid rate of 161mls/hr This provides 72 g of protein and total calories: 1502 kCals per  day .Meeting about 75% of estimated needs. MVI and trace elements in TPN Change SSI q6h CBG checks Monitor TPN labs, renal fxn, fluid balance, glucose tolerance  Joshua Downs, Joshua Downs, Joshua Downs Clinical Pharmacist Pager [phone] 02/02/2017,10:44 AM

## 2017-02-02 NOTE — Progress Notes (Signed)
Nutrition Follow-up  DOCUMENTATION CODES:  Severe malnutrition in context of acute illness/injury   Resolving  INTERVENTION:  Clinically Dehydrated per MD. Pt receiving ~50% fluid needs with current TPN infusion  BG has been within reccomended 140-180 mg/dl range. Would recommend advancing TPN to goal to meet 100% kcal, pro needs and to help with adequate fluids.   Pt's kcal/ Protein/ needs are now increased due to acute surgical intervention. Please see recs below  Would consider adding Insulin to bag to help prevent initial hyperglycemia vs pt showing hyperglycemia with subsequent correction with SSI.    NUTRITION DIAGNOSIS:  Malnutrition (Severe) related to inability to eat, nausea, vomiting (caused by SBO) as evidenced by percent weight loss, energy intake < or equal to 50% for > or equal to 5 days.  Resolving with TPN support- needs to be advanced  GOAL:  Patient will meet greater than or equal to 90% of their needs  Not met, only 50% needs met  MONITOR:  Diet advancement, Labs, Weight trends  REASON FOR ASSESSMENT:  Malnutrition Screening Tool    ASSESSMENT:  83 y/o male PMHx HTN, Remote L kidney cancer s/p partial nephrectomy. Had recent elective back surgery 3/8. Developed abdominal pain with distension Saturday-Sunday, followed by n/v and poor appetite. Ct shows high grade SBO and AKI . admitted for management.   Interval Hx:  3/30: NGT pulled, Diet advanced to Clear liquids , but later in afternoon, showed distension and no flatus-> NGT reinserted, NPO  4/3: NGT again pulled and diet advanced to CL-failed diet-n/v.  4/4: NGT reinserted, NPO, TPN initiated-indication:Bowel obstruction, malnutrition. 4/5 Surgery-exlap+ extensive LOA.    Patient seen POD 1. NGT still present. No real complaints other than pain.   New bed weight shows wt over >170 lbs-not thought to be accurate- No lower extremity edema.    Per MD, no attempts at oral nutrition today,  potentially tomorrow depending on pt appears later this afternoon.   Current TPN: 960 mls, Clinimix 5/15 and 250 mls 20% intralipid over 12 hrs.   TPN not advanced due to poor sugar control (>150). Current TPN providing  48 g Pro, 144 g Dex. GIR only 1.4 Provides: 1181.6 kcals/day  Has not been able to meet Kcal/Pro/Fluid needs with TPN because of this. IVF to be ordered.    Current Lactated Ringers @ 85 providing ~ 2 liters per 24 hrs to meet 100% needs  Pt's kcal/ Protein needs are now increased due to acute surgical intervention. Please see recs below  Meds: Ativan, D5 IVF, providing 17 kcals/hr, 408 kcals/day Labs: BG: 150-170, albumin: 2.9, tg 114, Phos 4.7, Mag 2, K 3.9   Recent Labs Lab 01/30/17 0553 01/28/2017 0520 02/02/17 0518  NA 139 132* 132*  K 4.1 3.9 4.7  CL 101 86* 92*  CO2 32 36* 32  BUN 27* 41* 43*  CREATININE 1.29* 1.80* 2.00*  CALCIUM 8.6* 8.6* 7.8*  MG 1.9 2.0 1.6*  PHOS  --  4.7* 4.8*  GLUCOSE 155* 177* 170*    Diet Order:  TPN (CLINIMIX-E) Adult  Skin: Fresh surgical incision to abdomen   Last BM:  4/3  Height:  Ht Readings from Last 1 Encounters:  01/25/2017 6' (1.829 m)   Weight:  Wt Readings from Last 1 Encounters:  01/10/2017 161 lb 6.4 oz (73.2 kg)   Wt Readings from Last 10 Encounters:  12/28/2016 161 lb 6.4 oz (73.2 kg)  12/30/2016 168 lb 3.2 oz (76.3 kg)  01/04/17 179 lb (81.2  kg)  01/03/17 179 lb 1.6 oz (81.2 kg)  07/06/16 182 lb (82.6 kg)  05/23/16 181 lb 9.6 oz (82.4 kg)  04/13/15 179 lb (81.2 kg)  02/18/15 182 lb (82.6 kg)  12/18/13 182 lb 12.2 oz (82.9 kg)  10/18/13 195 lb (88.5 kg)   Ideal Body Weight:  80.91 kg   BMI:  Body mass index is 21.89 kg/m.  Estimated Nutritional Needs:  Kcal:  2250-2500 kcals (31-34 kcal/kg bw) Protein:  110-125 g (1.5-1.7 g/kg bw) Fluid:  2.2-2.5 L fluid (1 ml/kcal)  EDUCATION NEEDS:  No education needs identified at this time  Burtis Junes RD, LDN, [place] [facility] Pager:  [phone] 02/02/2017 12:45 PM

## 2017-02-03 LAB — GLUCOSE, CAPILLARY
GLUCOSE-CAPILLARY: 147 mg/dL — AB (ref 65–99)
GLUCOSE-CAPILLARY: 136 mg/dL — AB (ref 65–99)
Glucose-Capillary: 161 mg/dL — ABNORMAL HIGH (ref 65–99)
Glucose-Capillary: 171 mg/dL — ABNORMAL HIGH (ref 65–99)

## 2017-02-03 LAB — BASIC METABOLIC PANEL
ANION GAP: 8 (ref 5–15)
BUN: 34 mg/dL — AB (ref 6–20)
CALCIUM: 7.8 mg/dL — AB (ref 8.9–10.3)
CO2: 30 mmol/L (ref 22–32)
CREATININE: 1.51 mg/dL — AB (ref 0.61–1.24)
Chloride: 96 mmol/L — ABNORMAL LOW (ref 101–111)
GFR calc Af Amer: 47 mL/min — ABNORMAL LOW (ref >60)
GFR, EST NON AFRICAN AMERICAN: 41 mL/min — AB (ref >60)
GLUCOSE: 164 mg/dL — AB (ref 65–99)
Potassium: 3.8 mmol/L (ref 3.5–5.1)
Sodium: 134 mmol/L — ABNORMAL LOW (ref 135–145)

## 2017-02-03 LAB — CBC
HCT: 40.5 % (ref 39.0–52.0)
Hemoglobin: 13.4 g/dL (ref 13.0–17.0)
MCH: 28.5 pg (ref 26.0–34.0)
MCHC: 33.1 g/dL (ref 30.0–36.0)
MCV: 86 fL (ref 78.0–100.0)
Platelets: 209 10*3/uL (ref 150–400)
RBC: 4.71 MIL/uL (ref 4.22–5.81)
RDW: 14.4 % (ref 11.5–15.5)
WBC: 9.3 10*3/uL (ref 4.0–10.5)

## 2017-02-03 MED ORDER — FAT EMULSION 20 % IV EMUL
250.0000 mL | INTRAVENOUS | Status: AC
  Administered 2017-02-03: 250 mL via INTRAVENOUS
  Filled 2017-02-03: qty 250

## 2017-02-03 MED ORDER — ENOXAPARIN SODIUM 40 MG/0.4ML ~~LOC~~ SOLN
40.0000 mg | SUBCUTANEOUS | Status: DC
  Administered 2017-02-04: 40 mg via SUBCUTANEOUS
  Filled 2017-02-03: qty 0.4

## 2017-02-03 MED ORDER — TRACE MINERALS CR-CU-MN-SE-ZN 10-1000-500-60 MCG/ML IV SOLN
80.0000 mL/h | INTRAVENOUS | Status: AC
  Administered 2017-02-03: 17:00:00 via INTRAVENOUS
  Filled 2017-02-03: qty 1920

## 2017-02-03 MED ORDER — ACETAMINOPHEN 10 MG/ML IV SOLN
1000.0000 mg | Freq: Four times a day (QID) | INTRAVENOUS | Status: AC
  Administered 2017-02-03 – 2017-02-04 (×3): 1000 mg via INTRAVENOUS
  Filled 2017-02-03 (×3): qty 100

## 2017-02-03 NOTE — Progress Notes (Signed)
[place] Hospital Day(s): 10.   Post op day(s): 2 Days Post-Op.   Interval History: Patient seen and examined, no acute events or new complaints overnight. Patient reports pain somewhat improved today, but still requiring narcotic pain medication for pain control, denies flatus, N/V, fever/chills, CP, or SOB.  Review of Systems:  Constitutional: denies fever, chills  HEENT: denies cough or congestion  Respiratory: denies any shortness of breath  Cardiovascular: denies chest pain or palpitations  Gastrointestinal: abdominal pain, N/V, and bowel function as per interval history Genitourinary: denies burning with urination or urinary frequency Musculoskeletal: denies pain, decreased motor or sensation Integumentary: denies any other rashes or skin discolorations Neurological: denies HA or vision/hearing changes   Vital signs in last 24 hours: [min-max] current  Temp:  [97.7 F (36.5 C)-99 F (37.2 C)] 99 F (37.2 C) (04/07 0500) Pulse Rate:  [123-132] 130 (04/07 0500) Resp:  [18-20] 18 (04/07 0500) BP: (91-106)/(57-63) 105/63 (04/07 0500) SpO2:  [95 %-98 %] 96 % (04/07 0500)     Height: 6' (182.9 cm) Weight: 73.2 kg (161 lb 6.4 oz) BMI (Calculated): 21.9   Intake/Output this shift:  Total I/O In: 300 [P.O.:120; I.V.:180] Out: 700 [Emesis/NG output:700]   Intake/Output last 2 shifts:  @IOLAST2SHIFTS @   Physical Exam:  Constitutional: alert, cooperative, and no distress, though somewhat lethargic after receiving narcotic pain medication, and therefore less able to utilize incentive spirometer as well as he was yesterday HENT: normocephalic without obvious abnormality  Eyes: PERRL, EOM's grossly intact and symmetric  Neuro: CN II - XII grossly intact and symmetric without deficit  Respiratory: breathing non-labored at rest  Cardiovascular: regular rate and sinus rhythm  Gastrointestinal: soft, moderate peri-incisional tenderness with Right abdomen non-tender,  and Left abdomen minimally to mildly tender, ND Musculoskeletal: UE and LE FROM, no edema or wounds, motor and sensation grossly intact, NT   Labs:  CBC Latest Ref Rng & Units 02/03/2017 02/17/2017 01/25/2017  WBC 4.0 - 10.5 K/uL 9.3 9.7 4.3  Hemoglobin 13.0 - 17.0 g/dL 13.4 14.6 13.3  Hematocrit 39.0 - 52.0 % 40.5 43.4 39.6  Platelets 150 - 400 K/uL 209 286 253   CMP Latest Ref Rng & Units 02/03/2017 02/02/2017 01/28/2017  Glucose 65 - 99 mg/dL 164(H) 170(H) 177(H)  BUN 6 - 20 mg/dL 34(H) 43(H) 41(H)  Creatinine 0.61 - 1.24 mg/dL 1.51(H) 2.00(H) 1.80(H)  Sodium 135 - 145 mmol/L 134(L) 132(L) 132(L)  Potassium 3.5 - 5.1 mmol/L 3.8 4.7 3.9  Chloride 101 - 111 mmol/L 96(L) 92(L) 86(L)  CO2 22 - 32 mmol/L 30 32 36(H)  Calcium 8.9 - 10.3 mg/dL 7.8(L) 7.8(L) 8.6(L)  Total Protein 6.5 - 8.1 g/dL - 5.5(L) 6.8  Total Bilirubin 0.3 - 1.2 mg/dL - 0.7 0.8  Alkaline Phos 38 - 126 U/L - 52 73  AST 15 - 41 U/L - 20 29  ALT 17 - 63 U/L - 33 57    Imaging studies: No new pertinent imaging studies  Assessment/Plan:  83 y.o.malewith improved renal function and urine output, doing otherwise overall okay, now POD #2 s/p exploratory laparotomy with lysis of adhesions and repair of Left-sided enterotomies x6for refractory small bowel obstruction secondary to internal hernia associated with post-surgical adhesions following remote hand-assisted Left partial nephrectomy for malignancy, complicated by small-moderate amount of narcotics for post-surgical pain s/p recent spine surgery and bypertinent comorbidities including HTN, chronic bradycardia with Right BBB, BPH, and osteoarthritis with chronic back pain.    - d/c Foley cath (  ordered) - NPO, TPN, IVF (as ordered)  - will plan to remove NG tube tomorrow             - okay with a few ice chips and losenges for comfort - monitor ongoing bowel function and abdominal exam - pain control as needed (acetaminophen IV x3 doses to  reduce narcotics) - ambulation and incentive spirometry encouraged - medical management as per medical team - DVT prophylaxis  All of the above findings and recommendations were discussed with the patient and his family, and all of patient's and his family'squestions were answered to theirexpressed satisfaction.  -- Marilynne Drivers Rosana Hoes, MD, [place] Office: [phone]

## 2017-02-03 NOTE — Progress Notes (Signed)
PROGRESS NOTE    Joshua Downs  VOZ:[id number] DOB: 1931/02/18 DOA: 01/23/2017 PCP: Deloria Lair, MD     Brief Narrative:  83 year old man initially admitted to the hospital on 3/28 due to abdominal pain and found to have a small bowel obstruction. CT scan of the abdomen showed a high-grade small bowel obstruction with transition point in the mid abdomen. Was also noted to have acute kidney injury. NG tube was placed in the emergency department, surgical services were consulted. NG tube was initially removed on 3/30 and was reinserted on the same day due to recurrence of distention. NG tube was again removed on 4/3 due to patient's symptoms improving, unfortunately he has failed again with increased vomiting and NG tube has been replaced on 4/4. OR on 4/5 for ex lap with lysis of adhesions.   Assessment & Plan:   Principal Problem:   Small bowel obstruction Active Problems:   Acute renal failure (ARF) (HCC)   Hyperglycemia   Protein-calorie malnutrition, severe   Small bowel obstruction -He has failed conservative management and went to OR on 4/5 for ex lap and lysis of adhesions. -Follow surgical recommendations: keep NG in today. -Continue TPN.  Severe protein caloric malnutrition -Continue TPN. -Appreciate dietitian and pharmacy recommendations.  Hypernatremia -Resolved.  Hypertension -By mouth meds on hold given nothing by mouth state. -BP low normal today.  Acute on CKD Stage III -Baseline Cr aroung 1.1-1.2. -Cr maxed at 2.0 today; suspect due to prerenal azotemia. -Cr down to 1.5 today. -Continue IVF. -Recheck renal function in am.   DVT prophylaxis: Lovenox Code Status: Full code Family Communication: Patient only Disposition Plan: To be determined pending medical stability.  Consultants:   Surgery, Dr. Rosana Hoes  Procedures:   None  Antimicrobials:  Anti-infectives    Start     Dose/Rate Route Frequency Ordered Stop   02/02/17 0900  cefTRIAXone  (ROCEPHIN) 2 g in dextrose 5 % 50 mL IVPB     2 g 100 mL/hr over 30 Minutes Intravenous  Once 02/06/2017 1429 02/02/17 0930   01/28/2017 1730  metroNIDAZOLE (FLAGYL) IVPB 500 mg     500 mg 100 mL/hr over 60 Minutes Intravenous Every 8 hours 02/06/2017 1429 02/02/17 0325   02/24/2017 0822  cefTRIAXone (ROCEPHIN) 2 g in dextrose 5 % 50 mL IVPB     2 g 100 mL/hr over 30 Minutes Intravenous On call to O.R. 02/08/2017 4259 02/17/2017 0920   01/31/2017 5638  metroNIDAZOLE (FLAGYL) IVPB 500 mg  Status:  Discontinued     500 mg 100 mL/hr over 60 Minutes Intravenous Every 8 hours 02/07/2017 0822 02/21/2017 1330       Subjective: Is hungry, has some abdominal pain especially with movement or coughing.  Objective: Vitals:   02/02/17 1300 02/02/17 1700 02/02/17 2109 02/03/17 0500  BP: 95/60 (!) 99/59 (!) 91/57 105/63  Pulse: (!) 131 (!) 125 (!) 132 (!) 130  Resp: 18 18 20 18   Temp: 98.2 F (36.8 C) 97.9 F (36.6 C) 97.7 F (36.5 C) 99 F (37.2 C)  TempSrc: Oral Oral Oral Oral  SpO2: 98% 98% 95% 96%  Weight:      Height:        Intake/Output Summary (Last 24 hours) at 02/03/17 1618 Last data filed at 02/03/17 1400  Gross per 24 hour  Intake           4115.5 ml  Output             1925 ml  Net           2190.5 ml   Filed Weights   12/29/2016 1204 12/29/2016 1807  Weight: 76.2 kg (168 lb) 73.2 kg (161 lb 6.4 oz)    Examination:  General exam: Alert, awake, oriented x 3 Respiratory system: Clear to auscultation. Respiratory effort normal. Cardiovascular system:RRR. No murmurs, rubs, gallops. Gastrointestinal system: Abdomen is distended, hypoactive bowel sounds heard. Central nervous system: Alert and oriented. No focal neurological deficits. Extremities: No C/C/E, +pedal pulses Skin: No rashes, lesions or ulcers Psychiatry: Judgement and insight appear normal. Mood & affect appropriate.     Data Reviewed: I have personally reviewed following labs and imaging studies  CBC:  Recent  Labs Lab 02/11/2017 0520 02/03/17 0636  WBC 9.7 9.3  NEUTROABS 7.1  --   HGB 14.6 13.4  HCT 43.4 40.5  MCV 86.3 86.0  PLT 286 229   Basic Metabolic Panel:  Recent Labs Lab 01/28/17 0636 01/29/17 0443 01/30/17 0553 02/24/2017 0520 02/02/17 0518 02/03/17 0636  NA 144 141 139 132* 132* 134*  K 4.1 4.1 4.1 3.9 4.7 3.8  CL 105 103 101 86* 92* 96*  CO2 31 32 32 36* 32 30  GLUCOSE 172* 161* 155* 177* 170* 164*  BUN 37* 26* 27* 41* 43* 34*  CREATININE 1.07 1.15 1.29* 1.80* 2.00* 1.51*  CALCIUM 9.0 8.7* 8.6* 8.6* 7.8* 7.8*  MG 2.1  --  1.9 2.0 1.6*  --   PHOS  --   --   --  4.7* 4.8*  --    GFR: Estimated Creatinine Clearance: 37.7 mL/min (A) (by C-G formula based on SCr of 1.51 mg/dL (H)). Liver Function Tests:  Recent Labs Lab 02/25/2017 0520 02/02/17 0518  AST 29 20  ALT 57 33  ALKPHOS 73 52  BILITOT 0.8 0.7  PROT 6.8 5.5*  ALBUMIN 2.9* 2.2*   No results for input(s): LIPASE, AMYLASE in the last 168 hours. No results for input(s): AMMONIA in the last 168 hours. Coagulation Profile: No results for input(s): INR, PROTIME in the last 168 hours. Cardiac Enzymes: No results for input(s): CKTOTAL, CKMB, CKMBINDEX, TROPONINI in the last 168 hours. BNP (last 3 results) No results for input(s): PROBNP in the last 8760 hours. HbA1C: No results for input(s): HGBA1C in the last 72 hours. CBG:  Recent Labs Lab 02/02/17 1635 02/02/17 1834 02/03/17 0349 02/03/17 0545 02/03/17 1135  GLUCAP 142* 136* 161* 171* 147*   Lipid Profile:  Recent Labs  02/02/2017 0520  TRIG 114   Thyroid Function Tests: No results for input(s): TSH, T4TOTAL, FREET4, T3FREE, THYROIDAB in the last 72 hours. Anemia Panel: No results for input(s): VITAMINB12, FOLATE, FERRITIN, TIBC, IRON, RETICCTPCT in the last 72 hours. Urine analysis: No results found for: COLORURINE, APPEARANCEUR, LABSPEC, [place], GLUCOSEU, HGBUR, BILIRUBINUR, KETONESUR, PROTEINUR, UROBILINOGEN, NITRITE, LEUKOCYTESUR Sepsis  Labs: @LABRCNTIP (procalcitonin:4,lacticidven:4)  ) Recent Results (from the past 240 hour(s))  Surgical pcr screen     Status: Abnormal   Collection Time: 01/31/17 11:46 PM  Result Value Ref Range Status   MRSA, PCR NEGATIVE NEGATIVE Final   Staphylococcus aureus POSITIVE (A) NEGATIVE Final    Comment:        The Xpert SA Assay (FDA approved for NASAL specimens in patients over 34 years of age), is one component of a comprehensive surveillance program.  Test performance has been validated by [facility] for patients greater than or equal to 35 year old. It is not intended to diagnose infection nor to guide  or monitor treatment. RESULT CALLED TO, READ BACK BY AND VERIFIED WITH:  TETREAULT,H @ 2035 ON 02/14/2017 BY JUW          Radiology Studies: No results found.      Scheduled Meds: . acetaminophen  1,000 mg Intravenous Q6H  . diclofenac sodium  2 g Topical Daily  . [START ON 02/04/2017] enoxaparin (LOVENOX) injection  40 mg Subcutaneous Q24H  . insulin aspart  0-15 Units Subcutaneous Q6H  . LORazepam  0.5 mg Intravenous QHS  . mupirocin ointment   Nasal BID   Continuous Infusions: . Marland KitchenTPN (CLINIMIX-E) Adult 60 mL/hr (02/02/17 1844)  . Marland KitchenTPN (CLINIMIX-E) Adult     And  . fat emulsion    . lactated ringers 45 mL/hr at 02/03/17 0348     LOS: 10 days    Time spent: 25 minutes. Greater than 50% of this time was spent in direct contact with the patient coordinating care.     Lelon Frohlich, MD [facility] Pager [phone]  If 7PM-7AM, please contact night-coverage [email or web address] Password [email or web address] 02/03/2017, 4:18 PM

## 2017-02-03 NOTE — Progress Notes (Signed)
Patient has not voided since foley d/ced today, bladder scanned with 50cc noted. MD texted and informed and reported to night shift nurse.

## 2017-02-03 NOTE — Progress Notes (Signed)
PHARMACY - ADULT TOTAL PARENTERAL NUTRITION CONSULT NOTE   Pharmacy Consult for TPN Indication: Bowel obstruction, post op 4/5  Patient Measurements: Height: 6' (182.9 cm) Weight: 161 lb 6.4 oz (73.2 kg) IBW/kg (Calculated) : 77.6 TPN AdjBW (KG): 73.2 Body mass index is 21.89 kg/m.  Assessment: 83 year old male with history of hypertension, remote left kidney carcinoma status post partial nephrectomy, chronic bradycardia, recent back surgery who presented with abdominal pain for past 3 days. Denied any fevers, chills, nausea or vomiting. Denied dysuria or diarrhea.  Pt had surgery on 02/13/2017 for SBO. Slight increase in Scr noted now has improved with increasing fluids. Glucose control improving, still requiring some insulin.  GI: Pt has gone approximately 10 days with minimal to no nutrition. At this time, TPN is warranted if patient shows intolerance to clear liquids, as he on advancement before.   Insulin requirements in the past 24 hours: none BMP Latest Ref Rng & Units 02/03/2017 02/02/2017 02/16/2017  Glucose 65 - 99 mg/dL 164(H) 170(H) 177(H)  BUN 6 - 20 mg/dL 34(H) 43(H) 41(H)  Creatinine 0.61 - 1.24 mg/dL 1.51(H) 2.00(H) 1.80(H)  Sodium 135 - 145 mmol/L 134(L) 132(L) 132(L)  Potassium 3.5 - 5.1 mmol/L 3.8 4.7 3.9  Chloride 101 - 111 mmol/L 96(L) 92(L) 86(L)  CO2 22 - 32 mmol/L 30 32 36(H)  Calcium 8.9 - 10.3 mg/dL 7.8(L) 7.8(L) 8.6(L)   TPN Access: central IV access TPN start date: 01/31/17 Current Nutrition: none  Nutritional Goals (per RD recommendation on 01/30/2017): Estimated Nutritional Needs:  Kcal:  2050-2250 (28-31 kcal/kg bw) Protein:  88-102 g Pro (1.2-1.4 g/kg bw) Fluid:  >1.8 L (25 ml/kg bw)  Plan: Advance TPN rate to goal Increase Clinimix E 5/15 to 39ml/hr 20% lipid emulsion at 53ml/hr (infuse over 12 hours) Total IV fluid rate 122mls/hr  With LR,  per MD This provides 96 g of protein and total calories: 1843 kCals per day  MVI and trace elements in TPN Regular  Insulin 8 units/bag Change SSI q6h CBG checks Monitor TPN labs, renal fxn, fluid balance, glucose tolerance  Isac Sarna, BS [place], [place] Clinical Pharmacist Pager [phone] 02/03/2017,10:09 AM

## 2017-02-04 ENCOUNTER — Inpatient Hospital Stay (HOSPITAL_COMMUNITY): Payer: Medicare Other

## 2017-02-04 LAB — GLUCOSE, CAPILLARY
GLUCOSE-CAPILLARY: 174 mg/dL — AB (ref 65–99)
GLUCOSE-CAPILLARY: 132 mg/dL — AB (ref 65–99)
Glucose-Capillary: 186 mg/dL — ABNORMAL HIGH (ref 65–99)
Glucose-Capillary: 163 mg/dL — ABNORMAL HIGH (ref 65–99)
Glucose-Capillary: 132 mg/dL — ABNORMAL HIGH (ref 65–99)

## 2017-02-04 LAB — BASIC METABOLIC PANEL
Anion gap: 10 (ref 5–15)
BUN: 48 mg/dL — ABNORMAL HIGH (ref 6–20)
CALCIUM: 8.6 mg/dL — AB (ref 8.9–10.3)
CHLORIDE: 93 mmol/L — AB (ref 101–111)
CO2: 33 mmol/L — ABNORMAL HIGH (ref 22–32)
Creatinine, Ser: 1.81 mg/dL — ABNORMAL HIGH (ref 0.61–1.24)
GFR calc Af Amer: 38 mL/min — ABNORMAL LOW (ref >60)
GFR, EST NON AFRICAN AMERICAN: 33 mL/min — AB (ref >60)
GLUCOSE: 155 mg/dL — AB (ref 65–99)
POTASSIUM: 4.3 mmol/L (ref 3.5–5.1)
Sodium: 136 mmol/L (ref 135–145)

## 2017-02-04 MED ORDER — FAT EMULSION 20 % IV EMUL
250.0000 mL | INTRAVENOUS | Status: AC
  Administered 2017-02-04: 250 mL via INTRAVENOUS
  Filled 2017-02-04: qty 250

## 2017-02-04 MED ORDER — SODIUM CHLORIDE 0.9 % IV BOLUS (SEPSIS)
1000.0000 mL | Freq: Once | INTRAVENOUS | Status: AC
  Administered 2017-02-04: 1000 mL via INTRAVENOUS

## 2017-02-04 MED ORDER — SODIUM CHLORIDE 0.9 % IV SOLN
INTRAVENOUS | Status: DC
  Administered 2017-02-04 – 2017-02-13 (×20): via INTRAVENOUS

## 2017-02-04 MED ORDER — SODIUM CHLORIDE 0.9 % IV BOLUS (SEPSIS): 500.0000 mL | Freq: Once | INTRAVENOUS | Status: DC

## 2017-02-04 MED ORDER — M.V.I. ADULT IV INJ
80.0000 mL/h | INJECTION | INTRAVENOUS | Status: AC
  Administered 2017-02-04: 19:00:00 via INTRAVENOUS
  Filled 2017-02-04: qty 1920

## 2017-02-04 MED ORDER — METOPROLOL TARTRATE 5 MG/5ML IV SOLN: 5.0000 mg | Freq: Four times a day (QID) | INTRAVENOUS | Status: DC | PRN

## 2017-02-04 NOTE — Progress Notes (Signed)
Patient's daughter asking if abdominal CT or Xray could be performed tonight.  Dr. Jerilee Hoh notified via text page.

## 2017-02-04 NOTE — Progress Notes (Signed)
Dr. Jerilee Hoh gave order for KUB.  Dr. Arnoldo Morale to see patient in the morning.

## 2017-02-04 NOTE — Progress Notes (Signed)
Late entry:  Patient voided twice today.  Unable to obtain measurement.  Bladder scan approximately 52 ml.

## 2017-02-04 NOTE — Progress Notes (Signed)
Dr. Rosana Hoes gave order for patient to receive one liter bolus of NS.

## 2017-02-04 NOTE — Progress Notes (Addendum)
PHARMACY - ADULT TOTAL PARENTERAL NUTRITION CONSULT NOTE   Pharmacy Consult for TPN Indication: Bowel obstruction, surgery 4/5  Patient Measurements: Height: 6' (182.9 cm) Weight: 161 lb 6.4 oz (73.2 kg) IBW/kg (Calculated) : 77.6 TPN AdjBW (KG): 73.2 Body mass index is 21.89 kg/m.  Assessment: 83 year old male with history of hypertension, remote left kidney carcinoma status post partial nephrectomy, chronic bradycardia, recent back surgery who presented with abdominal pain for past 3 days. Denied any fevers, chills, nausea or vomiting. Denied dysuria or diarrhea.  Pt had surgery on 02/16/2017 for SBO. Slight increase in Scr noted now has improved with increasing fluids. Glucose control improving, still requiring some insulin. Surgeon plans to remove NG tube today.   GI: Pt has gone approximately 10 days with minimal to no nutrition. At this time, TPN is warranted if patient shows intolerance to clear liquids, as he on advancement before.   Insulin requirements in the past 24 hours: none BMP Latest Ref Rng & Units 02/03/2017 02/02/2017 02/18/2017  Glucose 65 - 99 mg/dL 164(H) 170(H) 177(H)  BUN 6 - 20 mg/dL 34(H) 43(H) 41(H)  Creatinine 0.61 - 1.24 mg/dL 1.51(H) 2.00(H) 1.80(H)  Sodium 135 - 145 mmol/L 134(L) 132(L) 132(L)  Potassium 3.5 - 5.1 mmol/L 3.8 4.7 3.9  Chloride 101 - 111 mmol/L 96(L) 92(L) 86(L)  CO2 22 - 32 mmol/L 30 32 36(H)  Calcium 8.9 - 10.3 mg/dL 7.8(L) 7.8(L) 8.6(L)   TPN Access: central IV access TPN start date: 01/31/17 Current Nutrition: none  Nutritional Goals (per RD recommendation on 01/30/2017): Estimated Nutritional Needs:  Kcal:  2050-2250 (28-31 kcal/kg bw) Protein:  88-102 g Pro (1.2-1.4 g/kg bw) Fluid:  >1.8 L (25 ml/kg bw)  Plan:  TPN rate at goal Continue Clinimix E 5/15 at 72ml/hr 20% lipid emulsion at 85ml/hr (infuse over 12 hours) Total IV fluid rate 175mls/hr  With LR,  per MD This provides 96 g of protein and total calories: 1843 kCals per day  MVI  and trace elements in TPN Increase Regular Insulin 10 units/bag Change SSI q6h CBG checks Monitor TPN labs, renal fxn, fluid balance, glucose tolerance  Isac Sarna, BS [place], BCPS Clinical Pharmacist Pager [phone] 02/04/2017,11:38 AM   Addendum: 4/8 at 1430 Pt tachycardic and dry. Fluids changed to NS with boluses and maintenance rate at 180mls/hr in addition to TPN fluids. F/U plan 4/9

## 2017-02-04 NOTE — Progress Notes (Signed)
EKG results in EPIC.  Dr. Jerilee Hoh notified via text page.

## 2017-02-04 NOTE — Progress Notes (Signed)
Late entry:  Patient's heart rate in 80s-90s after 1 liter bolus.  Respirations 34.  BP 93/56.  Dr. Jerilee Hoh gave order to not give the 500 ml bolus and may give the dose of pain medication.

## 2017-02-04 NOTE — Progress Notes (Signed)
PROGRESS NOTE    Joshua Downs  VOZ:[id number] DOB: 10-Aug-1931 DOA: 01/17/2017 PCP: Deloria Lair, MD     Brief Narrative:  83 year old man initially admitted to the hospital on 3/28 due to abdominal pain and found to have a small bowel obstruction. CT scan of the abdomen showed a high-grade small bowel obstruction with transition point in the mid abdomen. Was also noted to have acute kidney injury. NG tube was placed in the emergency department, surgical services were consulted. NG tube was initially removed on 3/30 and was reinserted on the same day due to recurrence of distention. NG tube was again removed on 4/3 due to patient's symptoms improving, unfortunately he has failed again with increased vomiting and NG tube has been replaced on 4/4. OR on 4/5 for ex lap with lysis of adhesions.   Assessment & Plan:   Principal Problem:   Small bowel obstruction Active Problems:   Acute renal failure (ARF) (HCC)   Hyperglycemia   Protein-calorie malnutrition, severe   Small bowel obstruction -He has failed conservative management and went to OR on 4/5 for ex lap and lysis of adhesions. -Follow surgical recommendations: keep NG in today. -Continue TPN.  Severe protein caloric malnutrition -Continue TPN. -Appreciate dietitian and pharmacy recommendations.  Hypernatremia -Resolved.  Hypertension -By mouth meds on hold given nothing by mouth state. -BP low normal today.  Acute on CKD Stage III -Baseline Cr aroung 1.1-1.2. -Cr maxed at 2.0 today; suspect due to prerenal azotemia. -Cr back up  to 1.8 after 1.5 on 4/7. -Has received 2 fluid boluses and have increased IVF rate to 125/hr. -Recheck renal function in am.  Wide Complex Tachycardia -Discussed via phone with cards, Dr. Meda Coffee. -Appears to be simply sinus tach with a wide QRS due to his RBBB. -After fluid bolus, HR has returned to 80s-90s.   DVT prophylaxis: Lovenox Code Status: Full code Family Communication:  Patient only Disposition Plan: To be determined pending medical stability.  Consultants:   Surgery, Dr. Rosana Hoes  Procedures:   None  Antimicrobials:  Anti-infectives    Start     Dose/Rate Route Frequency Ordered Stop   02/02/17 0900  cefTRIAXone (ROCEPHIN) 2 g in dextrose 5 % 50 mL IVPB     2 g 100 mL/hr over 30 Minutes Intravenous  Once 02/11/2017 1429 02/02/17 0930   02/23/2017 1730  metroNIDAZOLE (FLAGYL) IVPB 500 mg     500 mg 100 mL/hr over 60 Minutes Intravenous Every 8 hours 02/17/2017 1429 02/02/17 0325   02/05/2017 0822  cefTRIAXone (ROCEPHIN) 2 g in dextrose 5 % 50 mL IVPB     2 g 100 mL/hr over 30 Minutes Intravenous On call to O.R. 02/12/2017 4259 01/29/2017 0920   02/02/2017 5638  metroNIDAZOLE (FLAGYL) IVPB 500 mg  Status:  Discontinued     500 mg 100 mL/hr over 60 Minutes Intravenous Every 8 hours 02/20/2017 0822 02/04/2017 1330       Subjective: Is hungry, has some abdominal pain especially with movement or coughing.  Objective: Vitals:   02/03/17 2204 02/04/17 0623 02/04/17 1429 02/04/17 1510  BP: 103/60 98/67 (!) 93/56   Pulse: (!) 114 (!) 124 95 94  Resp: 20 20  (!) 34  Temp: 98.1 F (36.7 C) 97.2 F (36.2 C) 98.5 F (36.9 C)   TempSrc: Oral Oral Oral   SpO2: 93% 95%    Weight:      Height:        Intake/Output Summary (Last 24 hours) at 02/04/17  [place] filed at 02/04/17 1150  Gross per 24 hour  Intake             1857 ml  Output             1175 ml  Net              682 ml   Filed Weights   01/21/2017 1204 01/08/2017 1807  Weight: 76.2 kg (168 lb) 73.2 kg (161 lb 6.4 oz)    Examination:  General exam: Alert, awake, oriented x 3 Respiratory system: Clear to auscultation. Respiratory effort normal. Cardiovascular system:RRR. No murmurs, rubs, gallops. Gastrointestinal system: Abdomen is distended, hypoactive bowel sounds heard. Central nervous system: Alert and oriented. No focal neurological deficits. Extremities: No C/C/E, +pedal pulses Skin:  No rashes, lesions or ulcers Psychiatry: Judgement and insight appear normal. Mood & affect appropriate.     Data Reviewed: I have personally reviewed following labs and imaging studies  CBC:  Recent Labs Lab 01/31/2017 0520 02/03/17 0636  WBC 9.7 9.3  NEUTROABS 7.1  --   HGB 14.6 13.4  HCT 43.4 40.5  MCV 86.3 86.0  PLT 286 578   Basic Metabolic Panel:  Recent Labs Lab 01/30/17 0553 02/21/2017 0520 02/02/17 0518 02/03/17 0636 02/04/17 1134  NA 139 132* 132* 134* 136  K 4.1 3.9 4.7 3.8 4.3  CL 101 86* 92* 96* 93*  CO2 32 36* 32 30 33*  GLUCOSE 155* 177* 170* 164* 155*  BUN 27* 41* 43* 34* 48*  CREATININE 1.29* 1.80* 2.00* 1.51* 1.81*  CALCIUM 8.6* 8.6* 7.8* 7.8* 8.6*  MG 1.9 2.0 1.6*  --   --   PHOS  --  4.7* 4.8*  --   --    GFR: Estimated Creatinine Clearance: 31.5 mL/min (A) (by C-G formula based on SCr of 1.81 mg/dL (H)). Liver Function Tests:  Recent Labs Lab 02/21/2017 0520 02/02/17 0518  AST 29 20  ALT 57 33  ALKPHOS 73 52  BILITOT 0.8 0.7  PROT 6.8 5.5*  ALBUMIN 2.9* 2.2*   No results for input(s): LIPASE, AMYLASE in the last 168 hours. No results for input(s): AMMONIA in the last 168 hours. Coagulation Profile: No results for input(s): INR, PROTIME in the last 168 hours. Cardiac Enzymes: No results for input(s): CKTOTAL, CKMB, CKMBINDEX, TROPONINI in the last 168 hours. BNP (last 3 results) No results for input(s): PROBNP in the last 8760 hours. HbA1C: No results for input(s): HGBA1C in the last 72 hours. CBG:  Recent Labs Lab 02/03/17 1653 02/04/17 0144 02/04/17 0627 02/04/17 0807 02/04/17 1148  GLUCAP 136* 186* 174* 163* 132*   Lipid Profile: No results for input(s): CHOL, HDL, LDLCALC, TRIG, CHOLHDL, LDLDIRECT in the last 72 hours. Thyroid Function Tests: No results for input(s): TSH, T4TOTAL, FREET4, T3FREE, THYROIDAB in the last 72 hours. Anemia Panel: No results for input(s): VITAMINB12, FOLATE, FERRITIN, TIBC, IRON, RETICCTPCT  in the last 72 hours. Urine analysis: No results found for: COLORURINE, APPEARANCEUR, LABSPEC, PHURINE, GLUCOSEU, HGBUR, BILIRUBINUR, KETONESUR, PROTEINUR, UROBILINOGEN, NITRITE, LEUKOCYTESUR Sepsis Labs: @LABRCNTIP (procalcitonin:4,lacticidven:4)  ) Recent Results (from the past 240 hour(s))  Surgical pcr screen     Status: Abnormal   Collection Time: 01/31/17 11:46 PM  Result Value Ref Range Status   MRSA, PCR NEGATIVE NEGATIVE Final   Staphylococcus aureus POSITIVE (A) NEGATIVE Final    Comment:        The Xpert SA Assay (FDA approved for NASAL specimens in patients over 21 years of  age), is one component of a comprehensive surveillance program.  Test performance has been validated by [facility] for patients greater than or equal to 67 year old. It is not intended to diagnose infection nor to guide or monitor treatment. RESULT CALLED TO, READ BACK BY AND VERIFIED WITH:  TETREAULT,H @ 3748 ON 01/29/2017 BY JUW          Radiology Studies: No results found.      Scheduled Meds: . diclofenac sodium  2 g Topical Daily  . enoxaparin (LOVENOX) injection  40 mg Subcutaneous Q24H  . insulin aspart  0-15 Units Subcutaneous Q6H  . LORazepam  0.5 mg Intravenous QHS  . mupirocin ointment   Nasal BID  . sodium chloride  500 mL Intravenous Once   Continuous Infusions: . Marland KitchenTPN (CLINIMIX-E) Adult 80 mL/hr (02/03/17 1712)  . Marland KitchenTPN (CLINIMIX-E) Adult     And  . fat emulsion    . sodium chloride 125 mL/hr at 02/04/17 1526     LOS: 11 days    Time spent: 25 minutes. Greater than 50% of this time was spent in direct contact with the patient coordinating care.     Lelon Frohlich, MD [facility] Pager [phone]  If 7PM-7AM, please contact night-coverage [email or web address] Password [facility] 02/04/2017, 4:44 PM

## 2017-02-04 NOTE — Progress Notes (Signed)
   02/04/17 1857  Vitals  BP (!) 82/58  BP Location Right Arm  BP Method Automatic  Patient Position (if appropriate) Lying  Pulse Rate 79  Pulse Rate Source [place]  Dr. Jerilee Hoh notified via text page.  Patient very sleepy but will open eyes and speak.

## 2017-02-04 NOTE — Progress Notes (Signed)
[facility] Day(s): 11.   Post op day(s): 3 Days Post-Op.   Interval History: Patient seen and examined, reports abdominal pain has been slowly improving despite increased abdominal distention since yesterday. Also, patient's urine output was low yesterday with no evidence of intervention. Patient reports ambulating more and doing better with incentive spirometry, denies N/V, fever/chills, CP, or SOB.  Review of Systems:  Constitutional: denies fever, chills  HEENT: denies cough or congestion  Respiratory: denies any shortness of breath  Cardiovascular: denies chest pain or palpitations  Gastrointestinal: abdominal pain, N/V, and bowel function as per interval history Genitourinary: denies burning with urination or urinary frequency Musculoskeletal: denies pain, decreased motor or sensation Integumentary: denies any other rashes or skin discolorations Neurological: denies HA or vision/hearing changes   Vital signs in last 24 hours: [min-max] current  Temp:  [97.2 F (36.2 C)-98.1 F (36.7 C)] 97.2 F (36.2 C) (04/08 0623) Pulse Rate:  [114-124] 124 (04/08 0623) Resp:  [20] 20 (04/08 0623) BP: (98-127)/(60-76) 98/67 (04/08 0623) SpO2:  [93 %-99 %] 95 % (04/08 0623)     Height: 6' (182.9 cm) Weight: 73.2 kg (161 lb 6.4 oz) BMI (Calculated): 21.9   Intake/Output this shift:  No intake/output data recorded.   Intake/Output last 2 shifts:  @IOLAST2SHIFTS @   Physical Exam:  Constitutional: alert, cooperative and no distress  HENT: normocephalic without obvious abnormality  Eyes: PERRL, EOM's grossly intact and symmetric  Neuro: CN II - XII grossly intact and symmetric without deficit  Respiratory: breathing non-labored at rest  Cardiovascular: regular rate and sinus rhythm  Gastrointestinal: soft, somewhat decreased (still moderate) peri-incisional tenderness to palpation (mild on both Left and Right sides) with distention that has decreased significantly  since NG tube was reconnected to suction and flushed with 1 Liter thick bilious drainage  Musculoskeletal: UE and LE FROM, no edema or wounds, motor and sensation grossly intact, NT    Labs:  CBC Latest Ref Rng & Units 02/03/2017 02/21/2017 01/25/2017  WBC 4.0 - 10.5 K/uL 9.3 9.7 4.3  Hemoglobin 13.0 - 17.0 g/dL 13.4 14.6 13.3  Hematocrit 39.0 - 52.0 % 40.5 43.4 39.6  Platelets 150 - 400 K/uL 209 286 253   BMP Latest Ref Rng & Units 02/03/2017 02/02/2017 01/29/2017  Glucose 65 - 99 mg/dL 164(H) 170(H) 177(H)  BUN 6 - 20 mg/dL 34(H) 43(H) 41(H)  Creatinine 0.61 - 1.24 mg/dL 1.51(H) 2.00(H) 1.80(H)  Sodium 135 - 145 mmol/L 134(L) 132(L) 132(L)  Potassium 3.5 - 5.1 mmol/L 3.8 4.7 3.9  Chloride 101 - 111 mmol/L 96(L) 92(L) 86(L)  CO2 22 - 32 mmol/L 30 32 36(H)  Calcium 8.9 - 10.3 mg/dL 7.8(L) 7.8(L) 8.6(L)    Imaging studies: No new pertinent imaging studies   Assessment/Plan:  83 y.o.malewith post-operative ileus and dehydration POD #3s/p exploratory laparotomy with lysis of adhesions and repair of Left-sided enterotomies x83for refractory small bowel obstruction secondary to internal hernia associated with post-surgical adhesions following remote hand-assisted Left partial nephrectomy for malignancy, complicated by small-moderate amount of narcotics for post-surgical pain s/p recent spine surgery and bypertinent comorbidities including HTN, chronic bradycardia with Right BBB, BPH, and osteoarthritis with chronic back pain.   - NPO, TPN, IVF with bolus - okay with a few ice chips and losenges for comfort - monitor ongoing bowel function and abdominal exam             - considering NG tube not working over past 24 hours, will keep and plan to  remove tomorrow - pain control as needed (acetaminophen IV x3 doses yesterday through today to reduce narcotics) - ambulation and incentive spirometry encouraged - medical  management as per medical team - DVT prophylaxis  All of the above findings and recommendations were discussed with the patient and his family, and all of patient's and his family'squestions were answered to theirexpressed satisfaction.  -- Marilynne Drivers Rosana Hoes, MD, Central: [facility] Office: [phone]

## 2017-02-05 ENCOUNTER — Inpatient Hospital Stay (HOSPITAL_COMMUNITY): Payer: Medicare Other

## 2017-02-05 LAB — COMPREHENSIVE METABOLIC PANEL
ALK PHOS: 31 U/L — AB (ref 38–126)
ALT: 15 U/L — AB (ref 17–63)
AST: 20 U/L (ref 15–41)
Albumin: 1.7 g/dL — ABNORMAL LOW (ref 3.5–5.0)
Anion gap: 10 (ref 5–15)
BILIRUBIN TOTAL: 0.5 mg/dL (ref 0.3–1.2)
BUN: 63 mg/dL — ABNORMAL HIGH (ref 6–20)
CALCIUM: 7.9 mg/dL — AB (ref 8.9–10.3)
CO2: 28 mmol/L (ref 22–32)
Chloride: 97 mmol/L — ABNORMAL LOW (ref 101–111)
Creatinine, Ser: 2.12 mg/dL — ABNORMAL HIGH (ref 0.61–1.24)
GFR calc non Af Amer: 27 mL/min — ABNORMAL LOW (ref >60)
GFR, EST AFRICAN AMERICAN: 31 mL/min — AB (ref >60)
Glucose, Bld: 131 mg/dL — ABNORMAL HIGH (ref 65–99)
Potassium: 4.2 mmol/L (ref 3.5–5.1)
SODIUM: 135 mmol/L (ref 135–145)
TOTAL PROTEIN: 5.2 g/dL — AB (ref 6.5–8.1)

## 2017-02-05 LAB — CBC
HEMATOCRIT: 38.5 % — AB (ref 39.0–52.0)
Hemoglobin: 12.9 g/dL — ABNORMAL LOW (ref 13.0–17.0)
MCH: 28.7 pg (ref 26.0–34.0)
MCHC: 33.5 g/dL (ref 30.0–36.0)
MCV: 85.6 fL (ref 78.0–100.0)
Platelets: 253 10*3/uL (ref 150–400)
RBC: 4.5 MIL/uL (ref 4.22–5.81)
RDW: 14.8 % (ref 11.5–15.5)
WBC: 6.5 10*3/uL (ref 4.0–10.5)

## 2017-02-05 LAB — DIFFERENTIAL
BASOS ABS: 0 10*3/uL (ref 0.0–0.1)
BASOS PCT: 0 %
EOS PCT: 1 %
Eosinophils Absolute: 0.1 10*3/uL (ref 0.0–0.7)
LYMPHS ABS: 0.6 10*3/uL — AB (ref 0.7–4.0)
Lymphocytes Relative: 9 %
MONO ABS: 0.5 10*3/uL (ref 0.1–1.0)
Monocytes Relative: 7 %
Neutro Abs: 5.3 10*3/uL (ref 1.7–7.7)
Neutrophils Relative %: 83 %
WBC Morphology: INCREASED

## 2017-02-05 LAB — GLUCOSE, CAPILLARY
GLUCOSE-CAPILLARY: 117 mg/dL — AB (ref 65–99)
GLUCOSE-CAPILLARY: 137 mg/dL — AB (ref 65–99)
Glucose-Capillary: 121 mg/dL — ABNORMAL HIGH (ref 65–99)
Glucose-Capillary: 153 mg/dL — ABNORMAL HIGH (ref 65–99)
Glucose-Capillary: 154 mg/dL — ABNORMAL HIGH (ref 65–99)

## 2017-02-05 LAB — PHOSPHORUS: PHOSPHORUS: 4 mg/dL (ref 2.5–4.6)

## 2017-02-05 LAB — MAGNESIUM: Magnesium: 1.9 mg/dL (ref 1.7–2.4)

## 2017-02-05 LAB — TRIGLYCERIDES: Triglycerides: 165 mg/dL — ABNORMAL HIGH (ref <150)

## 2017-02-05 LAB — PREALBUMIN: Prealbumin: <5 mg/dL — ABNORMAL LOW (ref 18–38)

## 2017-02-05 MED ORDER — SODIUM CHLORIDE 0.9 % IV BOLUS (SEPSIS)
1000.0000 mL | Freq: Once | INTRAVENOUS | Status: AC
  Administered 2017-02-05: 1000 mL via INTRAVENOUS

## 2017-02-05 MED ORDER — IOPAMIDOL (ISOVUE-300) INJECTION 61%
INTRAVENOUS | Status: AC
  Filled 2017-02-05: qty 30

## 2017-02-05 MED ORDER — ENOXAPARIN SODIUM 30 MG/0.3ML ~~LOC~~ SOLN
30.0000 mg | SUBCUTANEOUS | Status: DC
  Administered 2017-02-05 – 2017-02-06 (×2): 30 mg via SUBCUTANEOUS
  Filled 2017-02-05 (×2): qty 0.3

## 2017-02-05 MED ORDER — TRACE MINERALS CR-CU-MN-SE-ZN 10-1000-500-60 MCG/ML IV SOLN
80.0000 mL/h | INTRAVENOUS | Status: AC
  Administered 2017-02-05: 17:00:00 via INTRAVENOUS
  Filled 2017-02-05: qty 1920

## 2017-02-05 MED ORDER — SODIUM CHLORIDE 0.9 % IV BOLUS (SEPSIS)
500.0000 mL | Freq: Once | INTRAVENOUS | Status: AC
  Administered 2017-02-05: 500 mL via INTRAVENOUS

## 2017-02-05 MED ORDER — FAT EMULSION 20 % IV EMUL
250.0000 mL | INTRAVENOUS | Status: AC
  Administered 2017-02-05: 250 mL via INTRAVENOUS
  Filled 2017-02-05: qty 250

## 2017-02-05 NOTE — Progress Notes (Signed)
Pt's BP low, abdomen distended, and NG tube only providing minimal output. Dr. Rosana Hoes notified, also notified him of abdominal xray results. Flushed NG tube and changed all tubing, output increased and distention has decreased some. Will monitor output and vitals throughout shift.

## 2017-02-05 NOTE — Progress Notes (Signed)
PHARMACY - ADULT TOTAL PARENTERAL NUTRITION CONSULT NOTE   Pharmacy Consult for TPN Indication: Bowel obstruction, surgery 4/5  Patient Measurements: Height: 6' (182.9 cm) Weight: 161 lb 6.4 oz (73.2 kg) IBW/kg (Calculated) : 77.6 TPN AdjBW (KG): 73.2 Body mass index is 21.89 kg/m.  Assessment: 83 year old male with history of hypertension, remote left kidney carcinoma status post partial nephrectomy, chronic bradycardia, recent back surgery who presented with abdominal pain for past 3 days. Denied any fevers, chills, nausea or vomiting. Denied dysuria or diarrhea.  Pt had surgery on 02/25/2017 for SBO.  Renal functioning worsening with increase in Scr again noted. MD has given adequate fluid boluses. Patient has still not had return of bowel function. NG tube was replaced. Planning CT scan of abdomen to ensure no other cause for his ileus.  Glucose control improved with BS 132-154.    GI: Pt has gone approximately 10 days with minimal to no nutrition. At this time, TPN is warranted if patient shows intolerance to clear liquids, as he on advancement before.   Insulin requirements in the past 24 hours: none BMP Latest Ref Rng & Units 02/05/2017 02/04/2017 02/03/2017  Glucose 65 - 99 mg/dL 131(H) 155(H) 164(H)  BUN 6 - 20 mg/dL 63(H) 48(H) 34(H)  Creatinine 0.61 - 1.24 mg/dL 2.12(H) 1.81(H) 1.51(H)  Sodium 135 - 145 mmol/L 135 136 134(L)  Potassium 3.5 - 5.1 mmol/L 4.2 4.3 3.8  Chloride 101 - 111 mmol/L 97(L) 93(L) 96(L)  CO2 22 - 32 mmol/L 28 33(H) 30  Calcium 8.9 - 10.3 mg/dL 7.9(L) 8.6(L) 7.8(L)   TPN Access: central IV access TPN start date: 01/31/17 Current Nutrition: none  Nutritional Goals (per RD recommendation on 01/30/2017): Estimated Nutritional Needs:  Kcal:  2050-2250 (28-31 kcal/kg bw) Protein:  88-102 g Pro (1.2-1.4 g/kg bw) Fluid:  >1.8 L (25 ml/kg bw)  Plan:  TPN rate at goal Continue Clinimix E 5/15 at 22ml/hr 20% lipid emulsion at 20ml/hr (infuse over 12 hours) Maintenance  IV fluids per MD TPN provides 96 g of protein and total calories: 1843 kCals per day  MVI and trace elements in TPN Regular Insulin 10 units/bag Change SSI q6h CBG checks Monitor TPN labs, renal fxn, fluid balance, glucose tolerance  Isac Sarna, BS Vena Austria, BCPS Clinical Pharmacist Pager [phone] 02/05/2017,11:59 AM

## 2017-02-05 NOTE — Progress Notes (Signed)
Pt has increased redness to sacrum compared to 02/03/2017, foam has been placed and pt has been encouraged to turn himself if possible to decrease pressure on sacrum. Assisted pt multiple times off of sacrum but he is uncomfortable turned on either side and returns back to supine position. Will continue to educate pt on importance of turning regularly and assist him as necessary to turn.

## 2017-02-05 NOTE — Progress Notes (Signed)
PROGRESS NOTE    Joshua Downs  JHE:[id number] DOB: 1931/03/07 DOA: 01/14/2017 PCP: Deloria Lair, MD     Brief Narrative:  83 year old man initially admitted to the hospital on 3/28 due to abdominal pain and found to have a small bowel obstruction. CT scan of the abdomen showed a high-grade small bowel obstruction with transition point in the mid abdomen. Was also noted to have acute kidney injury. NG tube was placed in the emergency department, surgical services were consulted. NG tube was initially removed on 3/30 and was reinserted on the same day due to recurrence of distention. NG tube was again removed on 4/3 due to patient's symptoms improving, unfortunately he has failed again with increased vomiting and NG tube has been replaced on 4/4. OR on 4/5 for ex lap with lysis of adhesions.   Assessment & Plan:   Principal Problem:   Small bowel obstruction Active Problems:   Acute renal failure (ARF) (HCC)   Hyperglycemia   Protein-calorie malnutrition, severe   Small bowel obstruction -He has failed conservative management and went to OR on 4/5 for ex lap and lysis of adhesions. -Follow surgical recommendations: keep NG in today. -Continue TPN. -CT scan today with large amount of free air but no leakage of contrast. D/W Dr. Arnoldo Morale plan to redo CT in am and after that recommendations will follow. -Still has not had return of bowel function.  Severe protein caloric malnutrition -Continue TPN. -Appreciate dietitian and pharmacy recommendations.  Hypernatremia -Resolved.  Hypertension -By mouth meds on hold given nothing by mouth state. -BP remains low normal.  Acute on CKD Stage III -Baseline Cr aroung 1.1-1.2. -Cr up to 2.12. -Will provide another fluid bolus. I believe this is prerenal azotemia given prolonged NPO state and GI losses (at least 2500 cc past 24 hours NG output). -Recheck renal function in am; if worsened consider renal consultation in am.  Wide Complex  Tachycardia -Discussed via phone with cards, Dr. Meda Coffee and also with Dr. Bronson Ing. -Appears to be simply sinus tach with a wide QRS due to his RBBB. -After fluid bolus, HR has returned to 80s-90s.   DVT prophylaxis: Lovenox Code Status: Full code Family Communication: Sons at bedside updated on plan of care and all questions answered. Disposition Plan: To be determined pending medical stability.  Consultants:   Surgery, Dr. Rosana Hoes  Procedures:   None  Antimicrobials:  Anti-infectives    Start     Dose/Rate Route Frequency Ordered Stop   02/02/17 0900  cefTRIAXone (ROCEPHIN) 2 g in dextrose 5 % 50 mL IVPB     2 g 100 mL/hr over 30 Minutes Intravenous  Once 02/22/2017 1429 02/02/17 0930   02/22/2017 1730  metroNIDAZOLE (FLAGYL) IVPB 500 mg     500 mg 100 mL/hr over 60 Minutes Intravenous Every 8 hours 01/31/2017 1429 02/02/17 0325   02/21/2017 0822  cefTRIAXone (ROCEPHIN) 2 g in dextrose 5 % 50 mL IVPB     2 g 100 mL/hr over 30 Minutes Intravenous On call to O.R. 02/21/2017 1856 02/26/2017 0920   02/14/2017 3149  metroNIDAZOLE (FLAGYL) IVPB 500 mg  Status:  Discontinued     500 mg 100 mL/hr over 60 Minutes Intravenous Every 8 hours 02/13/2017 0822 02/15/2017 1330       Subjective: Is hungry, has some abdominal pain especially with movement or coughing.  Objective: Vitals:   02/05/17 0045 02/05/17 0200 02/05/17 0600 02/05/17 1440  BP: (!) 84/56 (!) 98/54 90/60 91/60   Pulse: 82 82 80 (!)  107  Resp:   18   Temp:   98.9 F (37.2 C)   TempSrc:   Oral   SpO2:   97% 97%  Weight:      Height:        Intake/Output Summary (Last 24 hours) at 02/05/17 1817 Last data filed at 02/05/17 1700  Gross per 24 hour  Intake          3953.78 ml  Output             2025 ml  Net          1928.78 ml   Filed Weights   01/02/2017 1204 01/09/2017 1807  Weight: 76.2 kg (168 lb) 73.2 kg (161 lb 6.4 oz)    Examination:  General exam: Alert, awake, oriented x 3 Respiratory system: Clear to  auscultation. Respiratory effort normal. Cardiovascular system:RRR. No murmurs, rubs, gallops. Gastrointestinal system: Abdomen is distended, hypoactive bowel sounds heard. Central nervous system: Alert and oriented. No focal neurological deficits. Extremities: No C/C/E, +pedal pulses Skin: No rashes, lesions or ulcers Psychiatry: Judgement and insight appear normal. Mood & affect appropriate.     Data Reviewed: I have personally reviewed following labs and imaging studies  CBC:  Recent Labs Lab 02/25/2017 0520 02/03/17 0636 02/05/17 0414  WBC 9.7 9.3 6.5  NEUTROABS 7.1  --  5.3  HGB 14.6 13.4 12.9*  HCT 43.4 40.5 38.5*  MCV 86.3 86.0 85.6  PLT 286 209 354   Basic Metabolic Panel:  Recent Labs Lab 01/30/17 0553 01/29/2017 0520 02/02/17 0518 02/03/17 0636 02/04/17 1134 02/05/17 0414  NA 139 132* 132* 134* 136 135  K 4.1 3.9 4.7 3.8 4.3 4.2  CL 101 86* 92* 96* 93* 97*  CO2 32 36* 32 30 33* 28  GLUCOSE 155* 177* 170* 164* 155* 131*  BUN 27* 41* 43* 34* 48* 63*  CREATININE 1.29* 1.80* 2.00* 1.51* 1.81* 2.12*  CALCIUM 8.6* 8.6* 7.8* 7.8* 8.6* 7.9*  MG 1.9 2.0 1.6*  --   --  1.9  PHOS  --  4.7* 4.8*  --   --  4.0   GFR: Estimated Creatinine Clearance: 26.9 mL/min (A) (by C-G formula based on SCr of 2.12 mg/dL (H)). Liver Function Tests:  Recent Labs Lab 02/25/2017 0520 02/02/17 0518 02/05/17 0414  AST 29 20 20   ALT 57 33 15*  ALKPHOS 73 52 31*  BILITOT 0.8 0.7 0.5  PROT 6.8 5.5* 5.2*  ALBUMIN 2.9* 2.2* 1.7*   No results for input(s): LIPASE, AMYLASE in the last 168 hours. No results for input(s): AMMONIA in the last 168 hours. Coagulation Profile: No results for input(s): INR, PROTIME in the last 168 hours. Cardiac Enzymes: No results for input(s): CKTOTAL, CKMB, CKMBINDEX, TROPONINI in the last 168 hours. BNP (last 3 results) No results for input(s): PROBNP in the last 8760 hours. HbA1C: No results for input(s): HGBA1C in the last 72 hours. CBG:  Recent  Labs Lab 02/05/17 0033 02/05/17 0552 02/05/17 0739 02/05/17 1135 02/05/17 1619  GLUCAP 153* 154* 117* 137* 121*   Lipid Profile:  Recent Labs  02/05/17 0414  TRIG 165*   Thyroid Function Tests: No results for input(s): TSH, T4TOTAL, FREET4, T3FREE, THYROIDAB in the last 72 hours. Anemia Panel: No results for input(s): VITAMINB12, FOLATE, FERRITIN, TIBC, IRON, RETICCTPCT in the last 72 hours. Urine analysis: No results found for: COLORURINE, APPEARANCEUR, LABSPEC, PHURINE, GLUCOSEU, HGBUR, BILIRUBINUR, KETONESUR, PROTEINUR, UROBILINOGEN, NITRITE, LEUKOCYTESUR Sepsis Labs: @LABRCNTIP (procalcitonin:4,lacticidven:4)  ) Recent Results (from the  past 240 hour(s))  Surgical pcr screen     Status: Abnormal   Collection Time: 01/31/17 11:46 PM  Result Value Ref Range Status   MRSA, PCR NEGATIVE NEGATIVE Final   Staphylococcus aureus POSITIVE (A) NEGATIVE Final    Comment:        The Xpert SA Assay (FDA approved for NASAL specimens in patients over 40 years of age), is one component of a comprehensive surveillance program.  Test performance has been validated by [facility] for patients greater than or equal to 58 year old. It is not intended to diagnose infection nor to guide or monitor treatment. RESULT CALLED TO, READ BACK BY AND VERIFIED WITH:  TETREAULT,H @ 1275 ON 02/02/2017 BY JUW          Radiology Studies: Ct Abdomen Pelvis Wo Contrast  Result Date: 02/05/2017 CLINICAL DATA:  Small bowel obstruction. Status post laparotomy and lysis of adhesions on 01/29/2017. EXAM: CT ABDOMEN AND PELVIS WITHOUT CONTRAST TECHNIQUE: Multidetector CT imaging of the abdomen and pelvis was performed following the standard protocol without IV contrast. COMPARISON:  01/31/2017 FINDINGS: Lower chest: New small right pleural effusion with overlying dependent atelectasis in the lower lobe. Milder dependent atelectasis in the left lower lobe. Coronary artery atherosclerosis. Venous catheter  tip in the lower SVC. Hepatobiliary: No focal liver abnormality is seen on this unenhanced study. Unchanged gallstones. No biliary dilatation. Pancreas: Unremarkable. Spleen: Unremarkable. Adrenals/Urinary Tract: Unremarkable adrenal glands. No hydronephrosis. Unchanged right renal cysts measuring up to 2.1 cm in size. Unremarkable bladder. Stomach/Bowel: Enteric tube terminates in the gastric body. Small bowel dilatation has greatly decreased from the prior CT. Oral contrast is present in multiple loops of proximal and mid small bowel which remain mildly dilated up to approximately 4 cm in diameter. There is a transition to decompressed distal small bowel in the left mid to lower abdomen at the site of new suture material (series 2, image 68). A small amount of gas is present in the transverse colon. The colon is otherwise decompressed. There is sigmoid colon diverticulosis without definite evidence of acute diverticulitis. No extraluminal contrast material is identified. Vascular/Lymphatic: Abdominal aortic atherosclerosis without aneurysm. No enlarged lymph nodes. Reproductive: Mild prostatic enlargement. Other: Postsurgical changes from interval exploratory laparotomy with skin staples in the anterior abdominal wall. Large volume intraperitoneal free air with moderate volume intraperitoneal free fluid. Two large gas and fluid collections in the upper abdomen anterior to the colon measuring 12-15 cm each. Diffuse subcutaneous fat stranding, mildly asymmetric in the right flank region. Musculoskeletal: Left iliopsoas bursal fluid and calcification as previously seen. Prior L2-L5 fusion. Advanced disc degeneration at L1-2. IMPRESSION: 1. Postoperative changes from interval laparotomy. Large amount of intraperitoneal free air and moderate volume free fluid. Large gas and fluid collections in the upper abdomen. 2. Mild small bowel dilatation, improved from the prior study though with transition point in the left mid  to lower abdomen concerning for residual/recurrent obstruction. 3. New small right pleural effusion.  Bibasilar atelectasis. 4. Aortic atherosclerosis. Electronically Signed   By: Logan Bores M.D.   On: 02/05/2017 15:43   Dg Abd 1 View  Result Date: 02/04/2017 CLINICAL DATA:  Abdominal pain, vomiting, and distention. Small bowel obstruction. EXAM: ABDOMEN - 1 VIEW COMPARISON:  01/31/2017 FINDINGS: Nasogastric tube is again seen within the proximal stomach. Multiple dilated small bowel loops show improvement since previous study, however there remains a paucity of colonic gas. Skin staples are seen in the lower abdomen. Multiple calcified gallstones again  seen in the right upper quadrant. Lumbar spine fusion hardware again seen. IMPRESSION: Dilated small bowel loops show mild improvement, but remains suspicious for small bowel obstruction. Nasogastric tube in proximal stomach. Cholelithiasis. Electronically Signed   By: Earle Gell M.D.   On: 02/04/2017 19:56   Dg Chest Port 1 View  Result Date: 02/05/2017 CLINICAL DATA:  Assess positioning of the nasogastric tube EXAM: PORTABLE CHEST 1 VIEW COMPARISON:  Portable chest x-ray of January 27, 2017 FINDINGS: The nasogastric tubes proximal port projects above the GE junction. The tip projects below the inferior margin of the image. There is gas under the left hemidiaphragm which may lie within bowel but free air is not excluded. There is some gas in the upper quadrant on the left. The cardiac silhouette is enlarged. The pulmonary vascularity is engorged. IMPRESSION: The esophagogastric tube's proximal port projects above the GE junction. Advancement by 10 cm is needed. Possible free extraluminal gas within the abdomen though the findings could lie within bowel. An acute abdominal series is recommended. These results will be called to the ordering clinician or representative by the Radiologist Assistant, and communication documented in the PACS or zVision Dashboard.  Electronically Signed   By: David  Martinique M.D.   On: 02/05/2017 11:15        Scheduled Meds: . diclofenac sodium  2 g Topical Daily  . enoxaparin (LOVENOX) injection  30 mg Subcutaneous Q24H  . insulin aspart  0-15 Units Subcutaneous Q6H  . iopamidol      . LORazepam  0.5 mg Intravenous QHS  . mupirocin ointment   Nasal BID  . sodium chloride  500 mL Intravenous Once   Continuous Infusions: . Marland KitchenTPN (CLINIMIX-E) Adult 80 mL/hr (02/05/17 1726)   And  . fat emulsion 250 mL (02/05/17 1725)  . sodium chloride 125 mL/hr at 02/05/17 1035     LOS: 12 days    Time spent: 25 minutes. Greater than 50% of this time was spent in direct contact with the patient coordinating care.     Lelon Frohlich, MD [facility] Pager [phone]  If 7PM-7AM, please contact night-coverage [email or web address] Password [facility] 02/05/2017, 6:17 PM

## 2017-02-05 NOTE — Progress Notes (Signed)
Pt's BP has dropped again, notified hospitalist, bolus ordered and provided, will continue to monitor.

## 2017-02-05 NOTE — Progress Notes (Signed)
4 Days Post-Op  Subjective: Patient denies a significant abdominal pain. Has not had a bowel movement or flatus yet.  Objective: Vital signs in last 24 hours: Temp:  [98.5 F (36.9 C)-98.9 F (37.2 C)] 98.9 F (37.2 C) (04/09 0600) Pulse Rate:  [76-95] 80 (04/09 0600) Resp:  [18-34] 18 (04/09 0600) BP: (82-142)/(50-96) 90/60 (04/09 0600) SpO2:  [95 %-97 %] 97 % (04/09 0600) Last BM Date: 01/30/17  Intake/Output from previous day: 04/08 0701 - 04/09 0700 In: 3053.8 [I.V.:2739.8; IV Piggyback:314] Out: 2825 [Urine:300; Emesis/NG output:2525] Intake/Output this shift: Total I/O In: 900 [NG/GT:900] Out: -   General appearance: alert, cooperative and no distress Resp: clear to auscultation bilaterally Cardio: regular rate and rhythm, S1, S2 normal, no murmur, click, rub or gallop GI: Distended with minimal bowel sounds. Incision healing well. No rigidity noted.  Lab Results:   Recent Labs  02/03/17 0636 02/05/17 0414  WBC 9.3 6.5  HGB 13.4 12.9*  HCT 40.5 38.5*  PLT 209 253   BMET  Recent Labs  02/04/17 1134 02/05/17 0414  NA 136 135  K 4.3 4.2  CL 93* 97*  CO2 33* 28  GLUCOSE 155* 131*  BUN 48* 63*  CREATININE 1.81* 2.12*  CALCIUM 8.6* 7.9*   PT/INR No results for input(s): LABPROT, INR in the last 72 hours.  Studies/Results: Dg Abd 1 View  Result Date: 02/04/2017 CLINICAL DATA:  Abdominal pain, vomiting, and distention. Small bowel obstruction. EXAM: ABDOMEN - 1 VIEW COMPARISON:  01/31/2017 FINDINGS: Nasogastric tube is again seen within the proximal stomach. Multiple dilated small bowel loops show improvement since previous study, however there remains a paucity of colonic gas. Skin staples are seen in the lower abdomen. Multiple calcified gallstones again seen in the right upper quadrant. Lumbar spine fusion hardware again seen. IMPRESSION: Dilated small bowel loops show mild improvement, but remains suspicious for small bowel obstruction. Nasogastric tube  in proximal stomach. Cholelithiasis. Electronically Signed   By: Earle Gell M.D.   On: 02/04/2017 19:56   Dg Chest Port 1 View  Result Date: 02/05/2017 CLINICAL DATA:  Assess positioning of the nasogastric tube EXAM: PORTABLE CHEST 1 VIEW COMPARISON:  Portable chest x-ray of January 27, 2017 FINDINGS: The nasogastric tubes proximal port projects above the GE junction. The tip projects below the inferior margin of the image. There is gas under the left hemidiaphragm which may lie within bowel but free air is not excluded. There is some gas in the upper quadrant on the left. The cardiac silhouette is enlarged. The pulmonary vascularity is engorged. IMPRESSION: The esophagogastric tube's proximal port projects above the GE junction. Advancement by 10 cm is needed. Possible free extraluminal gas within the abdomen though the findings could lie within bowel. An acute abdominal series is recommended. These results will be called to the ordering clinician or representative by the Radiologist Assistant, and communication documented in the PACS or zVision Dashboard. Electronically Signed   By: David  Martinique M.D.   On: 02/05/2017 11:15    Anti-infectives: Anti-infectives    Start     Dose/Rate Route Frequency Ordered Stop   02/02/17 0900  cefTRIAXone (ROCEPHIN) 2 g in dextrose 5 % 50 mL IVPB     2 g 100 mL/hr over 30 Minutes Intravenous  Once 01/29/2017 1429 02/02/17 0930   02/10/2017 1730  metroNIDAZOLE (FLAGYL) IVPB 500 mg     500 mg 100 mL/hr over 60 Minutes Intravenous Every 8 hours 01/29/2017 1429 02/02/17 0325   02/26/2017 6283  cefTRIAXone (ROCEPHIN) 2 g in dextrose 5 % 50 mL IVPB     2 g 100 mL/hr over 30 Minutes Intravenous On call to O.R. 02/04/2017 3567 02/06/2017 0920   01/28/2017 0141  metroNIDAZOLE (FLAGYL) IVPB 500 mg  Status:  Discontinued     500 mg 100 mL/hr over 60 Minutes Intravenous Every 8 hours 02/20/2017 0822 02/10/2017 1330      Assessment/Plan: s/p Procedure(s): EXPLORATORY LAPAROTOMY LYSIS  OF ADHESION Impression: Postoperative day 4, status post exploratory lap, lysis of adhesions, enterotomy repair. Patient still has not had return of bowel function. His NG tube was replaced. Do recommend CT scan of abdomen to make sure there is no other cause for his ileus. His renal insufficiency is worsening and if this does not correct itself, would suggest nephrology consultation. He has been receiving adequate fluid boluses. We'll monitor closely with you. I did have a long talk with the patient and family. All questions were answered.  LOS: 12 days    Aviva Signs 02/05/2017

## 2017-02-06 ENCOUNTER — Inpatient Hospital Stay (HOSPITAL_COMMUNITY): Payer: Medicare Other

## 2017-02-06 DIAGNOSIS — E440 Moderate protein-calorie malnutrition: Secondary | ICD-10-CM | POA: Insufficient documentation

## 2017-02-06 LAB — CBC
HCT: 38.9 % — ABNORMAL LOW (ref 39.0–52.0)
HEMOGLOBIN: 12.9 g/dL — AB (ref 13.0–17.0)
MCH: 28.5 pg (ref 26.0–34.0)
MCHC: 33.2 g/dL (ref 30.0–36.0)
MCV: 85.9 fL (ref 78.0–100.0)
Platelets: 218 10*3/uL (ref 150–400)
RBC: 4.53 MIL/uL (ref 4.22–5.81)
RDW: 15.1 % (ref 11.5–15.5)
WBC: 7.4 10*3/uL (ref 4.0–10.5)

## 2017-02-06 LAB — GLUCOSE, CAPILLARY
GLUCOSE-CAPILLARY: 110 mg/dL — AB (ref 65–99)
GLUCOSE-CAPILLARY: 129 mg/dL — AB (ref 65–99)
GLUCOSE-CAPILLARY: 137 mg/dL — AB (ref 65–99)
GLUCOSE-CAPILLARY: 156 mg/dL — AB (ref 65–99)
Glucose-Capillary: 139 mg/dL — ABNORMAL HIGH (ref 65–99)
Glucose-Capillary: 153 mg/dL — ABNORMAL HIGH (ref 65–99)
Glucose-Capillary: 146 mg/dL — ABNORMAL HIGH (ref 65–99)

## 2017-02-06 LAB — MAGNESIUM: MAGNESIUM: 1.9 mg/dL (ref 1.7–2.4)

## 2017-02-06 LAB — BASIC METABOLIC PANEL
ANION GAP: 9 (ref 5–15)
BUN: 69 mg/dL — ABNORMAL HIGH (ref 6–20)
CALCIUM: 7.6 mg/dL — AB (ref 8.9–10.3)
CO2: 32 mmol/L (ref 22–32)
Chloride: 95 mmol/L — ABNORMAL LOW (ref 101–111)
Creatinine, Ser: 1.75 mg/dL — ABNORMAL HIGH (ref 0.61–1.24)
GFR calc non Af Amer: 34 mL/min — ABNORMAL LOW (ref >60)
GFR, EST AFRICAN AMERICAN: 39 mL/min — AB (ref >60)
Glucose, Bld: 156 mg/dL — ABNORMAL HIGH (ref 65–99)
Potassium: 3.7 mmol/L (ref 3.5–5.1)
Sodium: 136 mmol/L (ref 135–145)

## 2017-02-06 LAB — PHOSPHORUS: PHOSPHORUS: 4.6 mg/dL (ref 2.5–4.6)

## 2017-02-06 MED ORDER — INSULIN ASPART 100 UNIT/ML ~~LOC~~ SOLN
0.0000 [IU] | Freq: Three times a day (TID) | SUBCUTANEOUS | Status: DC
  Administered 2017-02-06: 3 [IU] via SUBCUTANEOUS
  Administered 2017-02-07: 2 [IU] via SUBCUTANEOUS
  Administered 2017-02-07 (×2): 3 [IU] via SUBCUTANEOUS
  Administered 2017-02-08: 2 [IU] via SUBCUTANEOUS
  Administered 2017-02-08: 3 [IU] via SUBCUTANEOUS
  Administered 2017-02-09: 2 [IU] via SUBCUTANEOUS
  Administered 2017-02-09 (×2): 3 [IU] via SUBCUTANEOUS
  Administered 2017-02-10: 2 [IU] via SUBCUTANEOUS
  Administered 2017-02-10 – 2017-02-11 (×2): 3 [IU] via SUBCUTANEOUS
  Administered 2017-02-11: 2 [IU] via SUBCUTANEOUS
  Administered 2017-02-12: 3 [IU] via SUBCUTANEOUS
  Administered 2017-02-12 – 2017-02-13 (×2): 2 [IU] via SUBCUTANEOUS

## 2017-02-06 MED ORDER — BISACODYL 10 MG RE SUPP
10.0000 mg | Freq: Two times a day (BID) | RECTAL | Status: DC
  Administered 2017-02-06 – 2017-02-07 (×4): 10 mg via RECTAL
  Filled 2017-02-06 (×4): qty 1

## 2017-02-06 MED ORDER — FAT EMULSION 20 % IV EMUL
250.0000 mL | INTRAVENOUS | Status: AC
  Administered 2017-02-06: 250 mL via INTRAVENOUS
  Filled 2017-02-06: qty 250

## 2017-02-06 MED ORDER — PIPERACILLIN-TAZOBACTAM 3.375 G IVPB
3.3750 g | Freq: Three times a day (TID) | INTRAVENOUS | Status: DC
  Administered 2017-02-06 – 2017-02-14 (×22): 3.375 g via INTRAVENOUS
  Filled 2017-02-06 (×22): qty 50

## 2017-02-06 MED ORDER — TRACE MINERALS CR-CU-MN-SE-ZN 10-1000-500-60 MCG/ML IV SOLN
80.0000 mL/h | INTRAVENOUS | Status: AC
  Administered 2017-02-06: 19:00:00 via INTRAVENOUS
  Filled 2017-02-06: qty 1920

## 2017-02-06 NOTE — Care Management Note (Signed)
Case Management Note  Patient Details  Name: Joshua Downs MRN: [id number] Date of Birth: 04/05/1931  If discussed at [place] Length of Stay Meetings, dates discussed: 02/06/2017   Additional Comments: Pt bowel function has no yet returning after surgery. Further testing being done.   Sherald Barge, RN 02/06/2017, 10:43 AM

## 2017-02-06 NOTE — Progress Notes (Signed)
Nutrition Follow-up  DOCUMENTATION CODES:  Severe malnutrition in context of acute illness/injury   Pt now meets criteria for MODERATE MALNUTRITION in the context of acuteillness as evidenced by mild fat wasting + Mild muscle wasting.  INTERVENTION:  TPN per pharmacy  RD to continue to periodically reassess for changes in malnutrition status and will help establish diet tolerance/renourish as bowel function returns  NUTRITION DIAGNOSIS:  Malnutrition (Severe) related to inability to eat, nausea, vomiting caused by SBO as evidenced by loss of >5% bw in 3 weeks and an oral intake that met < or equal to 50% of needs for > or equal to 5 days  Resolved with TPN  New diagnosis: Altered GI function related to Post op ileus as evidenced by lack of bowel function/need for prolonged tpn.   GOAL:  Patient will meet greater than or equal to 90% of their needs  meeting ~83% kcal needs, 87% Protein  MONITOR:  PO intake, Diet advancement, Labs, Weight trends, I & O's   ASSESSMENT:  83 y/o male PMHx HTN, Remote L kidney cancer s/p partial nephrectomy. Had recent elective back surgery 3/8. Developed abdominal pain with distension Saturday-Sunday, followed by n/v and poor appetite. Ct shows high grade SBO and AKI . admitted for management.   Interval Hx:  3/30: NGT pulled, Diet advanced to Clear liquids , but later in afternoon, showed distension and no flatus->NGT reinserted, NPO  4/3: NGT again pulled and diet advanced to CL-failed diet-n/v.  4/4: NGT reinserted, NPO, TPN initiated-indication:Bowel obstruction, malnutrition. 4/5 Surgery-exlap+ extensive LOA.  4/6 TPN advanced. Ongoing hypotension. AKI with Cr. 2.0 4/7 Tpn advanced to goal. AKI/ hypotension continues 4/8 Wide Complex Tachycardia. AKI/ hypotension continues 4/9 NGT removed. AKI/ hypotension continues. CT shows large free air, no leakage 4/10: No bowel movement. Passed some flatus  Having repeat CT today due to large  amount intra-abdominal air.   Patient seen POD 5. Still NPO  Pt today has very severe abdominal distension since last seen. He also is more confused from RDs last meeting.   RD asked about distension, n/v, but His speech is more garbled and cannot be understood.   Subjectively, also appears to be much weaker. He was able to ambulate last week. Today, he voiced he needed to go to the bathroom and he required large amounts of assistance to get on bedside commode.   Physical exam: Appears to have had acute increase in temporal wasting.   Bed weight was 180 lbs   Current TPN: 1920 mls, Clinimix 5/15 and 250 mls 20% intralipid over 12 hrs. Providing  96 g Pro, 288 g Dex. GIR: 2.7. Provides: 1863 kcals/day  Current NS @ 125 providing ~3 liters per 24 hrs to meet >>100% needs  Meds: Dulcolax, Insulin (10 units in bag)Ativan, D5 IVF, providing 17 kcals/hr, 408 kcals/day Labs: Creat: Intermittently peaks >2. BUN increasing. BG controlled 130-160. Mag/Phos WDL, though phos increased   Recent Labs Lab 02/02/17 0518  02/04/17 1134 02/05/17 0414 02/06/17 0414  NA 132*  < > 136 135 136  K 4.7  < > 4.3 4.2 3.7  CL 92*  < > 93* 97* 95*  CO2 32  < > 33* 28 32  BUN 43*  < > 48* 63* 69*  CREATININE 2.00*  < > 1.81* 2.12* 1.75*  CALCIUM 7.8*  < > 8.6* 7.9* 7.6*  MG 1.6*  --   --  1.9 1.9  PHOS 4.8*  --   --  4.0 4.6  GLUCOSE 170*  < > 155* 131* 156*  < > = values in this interval not displayed.  Diet Order:  .TPN (CLINIMIX-E) Adult .TPN (CLINIMIX-E) Adult  Skin:  Incision to abdomen  Last BM:  4/3  Height:  Ht Readings from Last 1 Encounters:  01/18/2017 6' (1.829 m)   Weight:  Wt Readings from Last 1 Encounters:  01/03/2017 161 lb 6.4 oz (73.2 kg)   Ideal Body Weight:  80.91 kg  BMI:  Body mass index is 21.89 kg/m.  Estimated Nutritional Needs:  Kcal:  2250-2500 kcals (31-34 kcal/kg bw) Protein:  110-125 g (1.5-1.7 g/kg bw) Fluid:  2.2-2.5 L fluid (1 ml/kcal)  EDUCATION  NEEDS:  No education needs identified at this time  Burtis Junes RD, LDN, [facility] Pager: [phone] 02/06/2017 2:49 PM

## 2017-02-06 NOTE — Progress Notes (Signed)
Pharmacy Antibiotic Note  Joshua Downs is a 83 y.o. male admitted on 01/21/2017 with intra abdominal infection.  Pharmacy has been consulted for ZOSYN dosing.  Plan: Zosyn 3.375g IV q8h (4 hour infusion).  Monitor labs, progress, c/s  Height: 6' (182.9 cm) Weight: 161 lb 6.4 oz (73.2 kg) IBW/kg (Calculated) : 77.6  Temp (24hrs), Avg:98.3 F (36.8 C), Min:98.2 F (36.8 C), Max:98.4 F (36.9 C)   Recent Labs Lab 01/29/2017 0520 02/02/17 0518 02/03/17 0636 02/04/17 1134 02/05/17 0414 02/06/17 0414  WBC 9.7  --  9.3  --  6.5 7.4  CREATININE 1.80* 2.00* 1.51* 1.81* 2.12* 1.75*    Estimated Creatinine Clearance: 32.5 mL/min (A) (by C-G formula based on SCr of 1.75 mg/dL (H)).    Allergies  Allergen Reactions  . No Known Allergies    Antimicrobials this admission: Zosyn 4/10 >>   Dose adjustments this admission:  Microbiology results:  BCx:  UCx:   Sputum:    MRSA PCR: positive  Thank you for allowing pharmacy to be a part of this patient's care.  Hart Robinsons A 02/06/2017 4:29 PM

## 2017-02-06 NOTE — Progress Notes (Signed)
PHARMACY - ADULT TOTAL PARENTERAL NUTRITION CONSULT NOTE   Pharmacy Consult for TPN Indication: Bowel obstruction, surgery 4/5  Patient Measurements: Height: 6' (182.9 cm) Weight: 161 lb 6.4 oz (73.2 kg) IBW/kg (Calculated) : 77.6 TPN AdjBW (KG): 73.2 Body mass index is 21.89 kg/m.  Assessment: 83 year old male with history of hypertension, remote left kidney carcinoma status post partial nephrectomy, chronic bradycardia, recent back surgery who presented with abdominal pain for past 3 days. Denied any fevers, chills, nausea or vomiting. Denied dysuria or diarrhea.  Pt had surgery on 02/09/2017 for SBO.  Renal functioning worsening with increase in Scr again noted. MD has given adequate fluid boluses. Patient has still not had return of bowel function. NG tube was replaced. Planning CT scan of abdomen to ensure no other cause for his ileus.  Glucose control improved with BS 132-154.    GI: Pt has gone approximately 10 days with minimal to no nutrition. At this time, TPN is warranted if patient shows intolerance to clear liquids.    Insulin requirements in the past 24 hours: none BMP Latest Ref Rng & Units 02/06/2017 02/05/2017 02/04/2017  Glucose 65 - 99 mg/dL 156(H) 131(H) 155(H)  BUN 6 - 20 mg/dL 69(H) 63(H) 48(H)  Creatinine 0.61 - 1.24 mg/dL 1.75(H) 2.12(H) 1.81(H)  Sodium 135 - 145 mmol/L 136 135 136  Potassium 3.5 - 5.1 mmol/L 3.7 4.2 4.3  Chloride 101 - 111 mmol/L 95(L) 97(L) 93(L)  CO2 22 - 32 mmol/L 32 28 33(H)  Calcium 8.9 - 10.3 mg/dL 7.6(L) 7.9(L) 8.6(L)   TPN Access: central IV access TPN start date: 01/31/17 Current Nutrition: none  Nutritional Goals (per RD recommendation on 01/30/2017): Estimated Nutritional Needs:  Kcal:  2050-2250 (28-31 kcal/kg bw) Protein:  88-102 g Pro (1.2-1.4 g/kg bw) Fluid:  >1.8 L (25 ml/kg bw)  Plan:  TPN rate at goal Continue Clinimix E 5/15 at 67ml/hr 20% lipid emulsion at 38ml/hr (infuse over 12 hours) Maintenance IV fluids per MD TPN  provides 96 g of protein and total calories: 1843 kCals per day  MVI and trace elements in TPN Regular Insulin 10 units/bag Change SSI q6h CBG checks Monitor TPN labs, renal fxn, fluid balance, glucose tolerance  Hart Robinsons, PharmD Clinical Pharmacist Pager:  [phone] 02/06/2017   02/06/2017,10:22 AM

## 2017-02-06 NOTE — Progress Notes (Signed)
5 Days Post-Op  Subjective: Patient accidentally pulled out NG tube last night. It is being replaced. Has not had a bowel movement. He did have a small amount of flatus.  Objective: Vital signs in last 24 hours: Temp:  [98.2 F (36.8 C)-98.4 F (36.9 C)] 98.4 F (36.9 C) (04/10 0453) Pulse Rate:  [66-107] 87 (04/10 0453) Resp:  [20] 20 (04/10 0453) BP: (91-114)/(60-70) 114/70 (04/10 0453) SpO2:  [95 %-97 %] 95 % (04/10 0453) Last BM Date: 01/30/17  Intake/Output from previous day: 04/09 0701 - 04/10 0700 In: 3918.8 [I.V.:3018.8; NG/GT:900] Out: 1275 [Urine:275; Emesis/NG output:1000] Intake/Output this shift: No intake/output data recorded.  General appearance: alert, cooperative and no distress GI: Soft but distended. Incision healing well. Occasional bowel sounds appreciated.  Lab Results:   Recent Labs  02/05/17 0414 02/06/17 0414  WBC 6.5 7.4  HGB 12.9* 12.9*  HCT 38.5* 38.9*  PLT 253 218   BMET  Recent Labs  02/05/17 0414 02/06/17 0414  NA 135 136  K 4.2 3.7  CL 97* 95*  CO2 28 32  GLUCOSE 131* 156*  BUN 63* 69*  CREATININE 2.12* 1.75*  CALCIUM 7.9* 7.6*   PT/INR No results for input(s): LABPROT, INR in the last 72 hours.  Studies/Results: Ct Abdomen Pelvis Wo Contrast  Result Date: 02/05/2017 CLINICAL DATA:  Small bowel obstruction. Status post laparotomy and lysis of adhesions on 02/07/2017. EXAM: CT ABDOMEN AND PELVIS WITHOUT CONTRAST TECHNIQUE: Multidetector CT imaging of the abdomen and pelvis was performed following the standard protocol without IV contrast. COMPARISON:  01/31/2017 FINDINGS: Lower chest: New small right pleural effusion with overlying dependent atelectasis in the lower lobe. Milder dependent atelectasis in the left lower lobe. Coronary artery atherosclerosis. Venous catheter tip in the lower SVC. Hepatobiliary: No focal liver abnormality is seen on this unenhanced study. Unchanged gallstones. No biliary dilatation. Pancreas:  Unremarkable. Spleen: Unremarkable. Adrenals/Urinary Tract: Unremarkable adrenal glands. No hydronephrosis. Unchanged right renal cysts measuring up to 2.1 cm in size. Unremarkable bladder. Stomach/Bowel: Enteric tube terminates in the gastric body. Small bowel dilatation has greatly decreased from the prior CT. Oral contrast is present in multiple loops of proximal and mid small bowel which remain mildly dilated up to approximately 4 cm in diameter. There is a transition to decompressed distal small bowel in the left mid to lower abdomen at the site of new suture material (series 2, image 68). A small amount of gas is present in the transverse colon. The colon is otherwise decompressed. There is sigmoid colon diverticulosis without definite evidence of acute diverticulitis. No extraluminal contrast material is identified. Vascular/Lymphatic: Abdominal aortic atherosclerosis without aneurysm. No enlarged lymph nodes. Reproductive: Mild prostatic enlargement. Other: Postsurgical changes from interval exploratory laparotomy with skin staples in the anterior abdominal wall. Large volume intraperitoneal free air with moderate volume intraperitoneal free fluid. Two large gas and fluid collections in the upper abdomen anterior to the colon measuring 12-15 cm each. Diffuse subcutaneous fat stranding, mildly asymmetric in the right flank region. Musculoskeletal: Left iliopsoas bursal fluid and calcification as previously seen. Prior L2-L5 fusion. Advanced disc degeneration at L1-2. IMPRESSION: 1. Postoperative changes from interval laparotomy. Large amount of intraperitoneal free air and moderate volume free fluid. Large gas and fluid collections in the upper abdomen. 2. Mild small bowel dilatation, improved from the prior study though with transition point in the left mid to lower abdomen concerning for residual/recurrent obstruction. 3. New small right pleural effusion.  Bibasilar atelectasis. 4. Aortic atherosclerosis.  Electronically Signed  By: Logan Bores M.D.   On: 02/05/2017 15:43   Dg Chest 1 View  Result Date: 02/06/2017 CLINICAL DATA:  History of renal malignancy, recent lysis of adhesions EXAM: CHEST 1 VIEW COMPARISON:  Portable chest x-ray of February 05, 2017 FINDINGS: There remains gas-filled bowel under the right hemidiaphragm. There are other loops of gas-filled bowel noted under the left hemidiaphragm. The cardiac silhouette is mildly enlarged. The pulmonary vascularity is prominent centrally. The interstitial markings are mildly prominent though stable. The elevated right hemidiaphragm decreases the excursion of the right lung. There is calcification in the wall of the aortic arch. The nasogastric tube tip lies below the inferior margin of the image. There is a portion of the proximal port seen in the region of the pylorus. IMPRESSION: Hypoinflation of the right lung due to elevation of the hemidiaphragm secondary to presumed gas-filled bowel under this diaphragm. Free intra abdominal air from the laparotomy 5 days ago would likely be reabsorbed by now. Stable cardiomegaly. Thoracic aortic atherosclerosis. An acute abdominal series is recommended to further evaluate the subdiaphragmatic structures and assure that no significant free extraluminal gas is in the present. This would also allow assessment of the positioning of the tip of the nasogastric tube. Electronically Signed   By: David  Martinique M.D.   On: 02/06/2017 07:31   Dg Abd 1 View  Result Date: 02/06/2017 CLINICAL DATA:  NG tube placement. EXAM: ABDOMEN - 1 VIEW COMPARISON:  02/04/2017 FINDINGS: NGT tip is in the proximal duodenum. Air in multiple large and small bowel loops with diminished distention of small bowel since the prior study. Contrast remains in the ascending colon. Multiple gallstones. IMPRESSION: NG tube tip in the duodenum.  Improved distention of small bowel. Electronically Signed   By: Lorriane Shire M.D.   On: 02/06/2017 10:06   Dg  Abd 1 View  Result Date: 02/04/2017 CLINICAL DATA:  Abdominal pain, vomiting, and distention. Small bowel obstruction. EXAM: ABDOMEN - 1 VIEW COMPARISON:  01/31/2017 FINDINGS: Nasogastric tube is again seen within the proximal stomach. Multiple dilated small bowel loops show improvement since previous study, however there remains a paucity of colonic gas. Skin staples are seen in the lower abdomen. Multiple calcified gallstones again seen in the right upper quadrant. Lumbar spine fusion hardware again seen. IMPRESSION: Dilated small bowel loops show mild improvement, but remains suspicious for small bowel obstruction. Nasogastric tube in proximal stomach. Cholelithiasis. Electronically Signed   By: Earle Gell M.D.   On: 02/04/2017 19:56   Dg Chest Port 1 View  Result Date: 02/05/2017 CLINICAL DATA:  Assess positioning of the nasogastric tube EXAM: PORTABLE CHEST 1 VIEW COMPARISON:  Portable chest x-ray of January 27, 2017 FINDINGS: The nasogastric tubes proximal port projects above the GE junction. The tip projects below the inferior margin of the image. There is gas under the left hemidiaphragm which may lie within bowel but free air is not excluded. There is some gas in the upper quadrant on the left. The cardiac silhouette is enlarged. The pulmonary vascularity is engorged. IMPRESSION: The esophagogastric tube's proximal port projects above the GE junction. Advancement by 10 cm is needed. Possible free extraluminal gas within the abdomen though the findings could lie within bowel. An acute abdominal series is recommended. These results will be called to the ordering clinician or representative by the Radiologist Assistant, and communication documented in the PACS or zVision Dashboard. Electronically Signed   By: David  Martinique M.D.   On: 02/05/2017 11:15  Anti-infectives: Anti-infectives    Start     Dose/Rate Route Frequency Ordered Stop   02/02/17 0900  cefTRIAXone (ROCEPHIN) 2 g in dextrose 5 % 50 mL  IVPB     2 g 100 mL/hr over 30 Minutes Intravenous  Once 02/07/2017 1429 02/02/17 0930   02/18/2017 1730  metroNIDAZOLE (FLAGYL) IVPB 500 mg     500 mg 100 mL/hr over 60 Minutes Intravenous Every 8 hours 02/22/2017 1429 02/02/17 0325   01/28/2017 0822  cefTRIAXone (ROCEPHIN) 2 g in dextrose 5 % 50 mL IVPB     2 g 100 mL/hr over 30 Minutes Intravenous On call to O.R. 02/25/2017 [id number] 02/08/2017 0920   01/28/2017 8638  metroNIDAZOLE (FLAGYL) IVPB 500 mg  Status:  Discontinued     500 mg 100 mL/hr over 60 Minutes Intravenous Every 8 hours 02/24/2017 0822 01/28/2017 1330      Assessment/Plan: s/p Procedure(s): EXPLORATORY LAPAROTOMY LYSIS OF ADHESION Impression: Postoperative day 5. Still with ileus. CT scan of abdomen noted. He does have more intra-abdominal air and would be anticipated, but there is no evidence of bowel leak at this time. Repeat CT scan today to see whether there is any leakage of contrast. This was discussed extensively with the patient and family.  LOS: 13 days    Aviva Signs 02/06/2017

## 2017-02-06 NOTE — Progress Notes (Signed)
PROGRESS NOTE    Joshua Downs  XNT:[id number] DOB: 05-Sep-1931 DOA: 12/29/2016 PCP: Deloria Lair, MD     Brief Narrative:  83 year old man initially admitted to the hospital on 3/28 due to abdominal pain and found to have a small bowel obstruction. CT scan of the abdomen showed a high-grade small bowel obstruction with transition point in the mid abdomen. Was also noted to have acute kidney injury. NG tube was placed in the emergency department, surgical services were consulted. NG tube was initially removed on 3/30 and was reinserted on the same day due to recurrence of distention. NG tube was again removed on 4/3 due to patient's symptoms improving, unfortunately he has failed again with increased vomiting and NG tube has been replaced on 4/4. OR on 4/5 for ex lap with lysis of adhesions. Very slow to progress post op. Still with abd pain and distention. CT today shows extravasatioin of contrast concerning for bowel perforation.   Assessment & Plan:   Principal Problem:   Small bowel obstruction Active Problems:   Acute renal failure (ARF) (HCC)   Hyperglycemia   Protein-calorie malnutrition, severe   Malnutrition of moderate degree   Small bowel obstruction -He has failed conservative management and went to OR on 4/5 for ex lap and lysis of adhesions. -Follow surgical recommendations: keep NG in today. -Continue TPN. -CT scan 4/9 with large amount of free air but no leakage of contrast. D/W Dr. Arnoldo Morale plan to redo CT in am and after that recommendations will follow. -Repeat CT 4/10 with extraluminal fluid/gas with extravasation of contrast concerning for bowel perforation. Dr. Arnoldo Morale aware. Will contact IR for possibility of percutaneous drainage. Will start Zosyn. He will discuss these new findings with the family. -Still has not had return of bowel function.  Severe protein caloric malnutrition -Continue TPN. -Appreciate dietitian and pharmacy  recommendations.  Hypernatremia -Resolved.  Hypertension -By mouth meds on hold given nothing by mouth state. -BP remains low normal.  Acute on CKD Stage III -Baseline Cr aroung 1.1-1.2. -Cr maxed at 2.12. -down to 1.75 on 4/10. -Will provide another fluid bolus. I believe this is prerenal azotemia given prolonged NPO state and GI losses from NG suction. -Recheck renal function in am.  Wide Complex Tachycardia -Discussed via phone with cards, Dr. Meda Coffee and also with Dr. Bronson Ing. -Appears to be simply sinus tach with a wide QRS due to his RBBB. -After fluid bolus, HR has returned to 80s-90s.   DVT prophylaxis: Lovenox Code Status: Full code Family Communication: Sons at bedside updated on plan of care and all questions answered on 4/9. Disposition Plan: To be determined pending medical stability.  Consultants:   Surgery, Dr. Rosana Hoes, Dr. Arnoldo Morale  Procedures:   OR 4/5  Antimicrobials:  Anti-infectives    Start     Dose/Rate Route Frequency Ordered Stop   02/02/17 0900  cefTRIAXone (ROCEPHIN) 2 g in dextrose 5 % 50 mL IVPB     2 g 100 mL/hr over 30 Minutes Intravenous  Once 02/05/2017 1429 02/02/17 0930   02/26/2017 1730  metroNIDAZOLE (FLAGYL) IVPB 500 mg     500 mg 100 mL/hr over 60 Minutes Intravenous Every 8 hours 02/08/2017 1429 02/02/17 0325   02/15/2017 0822  cefTRIAXone (ROCEPHIN) 2 g in dextrose 5 % 50 mL IVPB     2 g 100 mL/hr over 30 Minutes Intravenous On call to O.R. 01/28/2017 9675 02/15/2017 0920   02/10/2017 9163  metroNIDAZOLE (FLAGYL) IVPB 500 mg  Status:  Discontinued  500 mg 100 mL/hr over 60 Minutes Intravenous Every 8 hours 02/20/2017 0822 02/22/2017 1330       Subjective: Drowsy after pain meds. Still with mild abdominal pain.  Objective: Vitals:   02/05/17 0600 02/05/17 1440 02/05/17 2109 02/06/17 0453  BP: 90/60 91/60 95/64  114/70  Pulse: 80 (!) 107 66 87  Resp: 18  20 20   Temp: 98.9 F (37.2 C)  98.2 F (36.8 C) 98.4 F (36.9 C)  TempSrc:  Oral  Oral Oral  SpO2: 97% 97% 97% 95%  Weight:      Height:        Intake/Output Summary (Last 24 hours) at 02/06/17 1625 Last data filed at 02/06/17 0309  Gross per 24 hour  Intake          3018.75 ml  Output             1275 ml  Net          1743.75 ml   Filed Weights   01/16/2017 1204 01/23/2017 1807  Weight: 76.2 kg (168 lb) 73.2 kg (161 lb 6.4 oz)    Examination:  General exam: Alert, awake, oriented x 3 Respiratory system: Clear to auscultation. Respiratory effort normal. Cardiovascular system:RRR. No murmurs, rubs, gallops. Gastrointestinal system: Abdomen is distended, with hypoactive bowel sounds. Central nervous system: Alert and oriented. No focal neurological deficits. Extremities: No C/C/E, +pedal pulses Skin: No rashes, lesions or ulcers Psychiatry: Judgement and insight appear normal. Mood & affect appropriate.      Data Reviewed: I have personally reviewed following labs and imaging studies  CBC:  Recent Labs Lab 02/11/2017 0520 02/03/17 0636 02/05/17 0414 02/06/17 0414  WBC 9.7 9.3 6.5 7.4  NEUTROABS 7.1  --  5.3  --   HGB 14.6 13.4 12.9* 12.9*  HCT 43.4 40.5 38.5* 38.9*  MCV 86.3 86.0 85.6 85.9  PLT 286 209 253 010   Basic Metabolic Panel:  Recent Labs Lab 02/22/2017 0520 02/02/17 0518 02/03/17 0636 02/04/17 1134 02/05/17 0414 02/06/17 0414  NA 132* 132* 134* 136 135 136  K 3.9 4.7 3.8 4.3 4.2 3.7  CL 86* 92* 96* 93* 97* 95*  CO2 36* 32 30 33* 28 32  GLUCOSE 177* 170* 164* 155* 131* 156*  BUN 41* 43* 34* 48* 63* 69*  CREATININE 1.80* 2.00* 1.51* 1.81* 2.12* 1.75*  CALCIUM 8.6* 7.8* 7.8* 8.6* 7.9* 7.6*  MG 2.0 1.6*  --   --  1.9 1.9  PHOS 4.7* 4.8*  --   --  4.0 4.6   GFR: Estimated Creatinine Clearance: 32.5 mL/min (A) (by C-G formula based on SCr of 1.75 mg/dL (H)). Liver Function Tests:  Recent Labs Lab 02/24/2017 0520 02/02/17 0518 02/05/17 0414  AST 29 20 20   ALT 57 33 15*  ALKPHOS 73 52 31*  BILITOT 0.8 0.7 0.5  PROT 6.8  5.5* 5.2*  ALBUMIN 2.9* 2.2* 1.7*   No results for input(s): LIPASE, AMYLASE in the last 168 hours. No results for input(s): AMMONIA in the last 168 hours. Coagulation Profile: No results for input(s): INR, PROTIME in the last 168 hours. Cardiac Enzymes: No results for input(s): CKTOTAL, CKMB, CKMBINDEX, TROPONINI in the last 168 hours. BNP (last 3 results) No results for input(s): PROBNP in the last 8760 hours. HbA1C: No results for input(s): HGBA1C in the last 72 hours. CBG:  Recent Labs Lab 02/06/17 0005 02/06/17 0235 02/06/17 0448 02/06/17 0752 02/06/17 1130  GLUCAP 110* 139* 153* 146* 137*   Lipid Profile:  Recent Labs  02/05/17 0414  TRIG 165*   Thyroid Function Tests: No results for input(s): TSH, T4TOTAL, FREET4, T3FREE, THYROIDAB in the last 72 hours. Anemia Panel: No results for input(s): VITAMINB12, FOLATE, FERRITIN, TIBC, IRON, RETICCTPCT in the last 72 hours. Urine analysis: No results found for: COLORURINE, APPEARANCEUR, LABSPEC, PHURINE, GLUCOSEU, HGBUR, BILIRUBINUR, KETONESUR, PROTEINUR, UROBILINOGEN, NITRITE, LEUKOCYTESUR Sepsis Labs: @LABRCNTIP (procalcitonin:4,lacticidven:4)  ) Recent Results (from the past 240 hour(s))  Surgical pcr screen     Status: Abnormal   Collection Time: 01/31/17 11:46 PM  Result Value Ref Range Status   MRSA, PCR NEGATIVE NEGATIVE Final   Staphylococcus aureus POSITIVE (A) NEGATIVE Final    Comment:        The Xpert SA Assay (FDA approved for NASAL specimens in patients over 15 years of age), is one component of a comprehensive surveillance program.  Test performance has been validated by [facility] for patients greater than or equal to 60 year old. It is not intended to diagnose infection nor to guide or monitor treatment. RESULT CALLED TO, READ BACK BY AND VERIFIED WITH:  TETREAULT,H @ 5366 ON 02/23/2017 BY JUW          Radiology Studies: Ct Abdomen Pelvis Wo Contrast  Result Date: 02/06/2017 CLINICAL  DATA:  Follow-up surgical pneumoperitoneum EXAM: CT ABDOMEN AND PELVIS WITHOUT CONTRAST TECHNIQUE: Multidetector CT imaging of the abdomen and pelvis was performed following the standard protocol without IV contrast. COMPARISON:  02/05/2017 FINDINGS: Lower chest: Lung bases are clear. Small right pleural effusion, mildly increased. Associated right lower lobe opacity, likely compressive atelectasis. Hepatobiliary: Unenhanced liver is grossly unremarkable. Cholelithiasis, without associated inflammatory changes. Pancreas: Within normal limits. Spleen: Within normal limits. Adrenals/Urinary Tract: Adrenal glands are within normal limits. Small right renal cysts measuring up to 2.0 cm. Left kidney is unremarkable. No renal, ureteral, or bladder calculi. No hydronephrosis. Bladder is grossly unremarkable. Stomach/Bowel: Stomach is grossly unremarkable. No evidence of bowel obstruction. Suture line from small bowel resection in the left lower abdomen (series 2/image 56). Sigmoid diverticulosis, without evidence of diverticulitis. Fluid/gas collection arising from the right lower abdomen near the cecum (series 2/ image 48), measuring approximately 9.1 x 27.4 cm in greatest axial dimension of the level of the mid abdomen (series 2/ image 39), and extending superiorly with mottled gas/debris along the right hepatic dome (series 2/image 7). This has mildly increased from the recent prior and is worrisome for perforation with extravasation of hyperdense fluid/ contrast (series 2/ image 42). Vascular/Lymphatic: No evidence of abdominal aortic aneurysm. Atherosclerotic calcifications of the abdominal aorta and branch vessels. No suspicious abdominopelvic lymphadenopathy. Reproductive: Prostatomegaly. Other: Additional small to moderate mesenteric/ pelvic ascites. Musculoskeletal: Degenerative changes of the visualized thoracolumbar spine. Status post L2-5 PLIF. IMPRESSION: Fluid/gas collection arising from the right lower  abdomen near the cecum, extending into the anterior mid abdomen, and superiorly along the right hepatic dome. Suspected extravasation of contrast into the collection, suggesting bowel perforation. This has progressed from the recent prior. Postsurgical changes related to prior bowel resection in the left lower abdomen. No evidence of bowel obstruction. Additional small to moderate mesenteric/ pelvic ascites. Small right pleural effusion. Associated right lower lobe opacity, likely atelectasis. Additional ancillary findings as above. These results were called by telephone at the time of interpretation on 02/06/2017 at 4:10 pm to Dr. Aviva Signs , who verbally acknowledged these results. Electronically Signed   By: Julian Hy M.D.   On: 02/06/2017 16:13   Ct Abdomen Pelvis Wo Contrast  Result Date: 02/05/2017 CLINICAL DATA:  Small bowel obstruction. Status post laparotomy and lysis of adhesions on 02/04/2017. EXAM: CT ABDOMEN AND PELVIS WITHOUT CONTRAST TECHNIQUE: Multidetector CT imaging of the abdomen and pelvis was performed following the standard protocol without IV contrast. COMPARISON:  01/31/2017 FINDINGS: Lower chest: New small right pleural effusion with overlying dependent atelectasis in the lower lobe. Milder dependent atelectasis in the left lower lobe. Coronary artery atherosclerosis. Venous catheter tip in the lower SVC. Hepatobiliary: No focal liver abnormality is seen on this unenhanced study. Unchanged gallstones. No biliary dilatation. Pancreas: Unremarkable. Spleen: Unremarkable. Adrenals/Urinary Tract: Unremarkable adrenal glands. No hydronephrosis. Unchanged right renal cysts measuring up to 2.1 cm in size. Unremarkable bladder. Stomach/Bowel: Enteric tube terminates in the gastric body. Small bowel dilatation has greatly decreased from the prior CT. Oral contrast is present in multiple loops of proximal and mid small bowel which remain mildly dilated up to approximately 4 cm in  diameter. There is a transition to decompressed distal small bowel in the left mid to lower abdomen at the site of new suture material (series 2, image 68). A small amount of gas is present in the transverse colon. The colon is otherwise decompressed. There is sigmoid colon diverticulosis without definite evidence of acute diverticulitis. No extraluminal contrast material is identified. Vascular/Lymphatic: Abdominal aortic atherosclerosis without aneurysm. No enlarged lymph nodes. Reproductive: Mild prostatic enlargement. Other: Postsurgical changes from interval exploratory laparotomy with skin staples in the anterior abdominal wall. Large volume intraperitoneal free air with moderate volume intraperitoneal free fluid. Two large gas and fluid collections in the upper abdomen anterior to the colon measuring 12-15 cm each. Diffuse subcutaneous fat stranding, mildly asymmetric in the right flank region. Musculoskeletal: Left iliopsoas bursal fluid and calcification as previously seen. Prior L2-L5 fusion. Advanced disc degeneration at L1-2. IMPRESSION: 1. Postoperative changes from interval laparotomy. Large amount of intraperitoneal free air and moderate volume free fluid. Large gas and fluid collections in the upper abdomen. 2. Mild small bowel dilatation, improved from the prior study though with transition point in the left mid to lower abdomen concerning for residual/recurrent obstruction. 3. New small right pleural effusion.  Bibasilar atelectasis. 4. Aortic atherosclerosis. Electronically Signed   By: Logan Bores M.D.   On: 02/05/2017 15:43   Dg Chest 1 View  Result Date: 02/06/2017 CLINICAL DATA:  History of renal malignancy, recent lysis of adhesions EXAM: CHEST 1 VIEW COMPARISON:  Portable chest x-ray of February 05, 2017 FINDINGS: There remains gas-filled bowel under the right hemidiaphragm. There are other loops of gas-filled bowel noted under the left hemidiaphragm. The cardiac silhouette is mildly  enlarged. The pulmonary vascularity is prominent centrally. The interstitial markings are mildly prominent though stable. The elevated right hemidiaphragm decreases the excursion of the right lung. There is calcification in the wall of the aortic arch. The nasogastric tube tip lies below the inferior margin of the image. There is a portion of the proximal port seen in the region of the pylorus. IMPRESSION: Hypoinflation of the right lung due to elevation of the hemidiaphragm secondary to presumed gas-filled bowel under this diaphragm. Free intra abdominal air from the laparotomy 5 days ago would likely be reabsorbed by now. Stable cardiomegaly. Thoracic aortic atherosclerosis. An acute abdominal series is recommended to further evaluate the subdiaphragmatic structures and assure that no significant free extraluminal gas is in the present. This would also allow assessment of the positioning of the tip of the nasogastric tube. Electronically Signed   By: David  Martinique  M.D.   On: 02/06/2017 07:31   Dg Abd 1 View  Result Date: 02/06/2017 CLINICAL DATA:  NG tube placement. EXAM: ABDOMEN - 1 VIEW COMPARISON:  02/04/2017 FINDINGS: NGT tip is in the proximal duodenum. Air in multiple large and small bowel loops with diminished distention of small bowel since the prior study. Contrast remains in the ascending colon. Multiple gallstones. IMPRESSION: NG tube tip in the duodenum.  Improved distention of small bowel. Electronically Signed   By: Lorriane Shire M.D.   On: 02/06/2017 10:06   Dg Abd 1 View  Result Date: 02/04/2017 CLINICAL DATA:  Abdominal pain, vomiting, and distention. Small bowel obstruction. EXAM: ABDOMEN - 1 VIEW COMPARISON:  01/31/2017 FINDINGS: Nasogastric tube is again seen within the proximal stomach. Multiple dilated small bowel loops show improvement since previous study, however there remains a paucity of colonic gas. Skin staples are seen in the lower abdomen. Multiple calcified gallstones again  seen in the right upper quadrant. Lumbar spine fusion hardware again seen. IMPRESSION: Dilated small bowel loops show mild improvement, but remains suspicious for small bowel obstruction. Nasogastric tube in proximal stomach. Cholelithiasis. Electronically Signed   By: Earle Gell M.D.   On: 02/04/2017 19:56   Dg Chest Port 1 View  Result Date: 02/05/2017 CLINICAL DATA:  Assess positioning of the nasogastric tube EXAM: PORTABLE CHEST 1 VIEW COMPARISON:  Portable chest x-ray of January 27, 2017 FINDINGS: The nasogastric tubes proximal port projects above the GE junction. The tip projects below the inferior margin of the image. There is gas under the left hemidiaphragm which may lie within bowel but free air is not excluded. There is some gas in the upper quadrant on the left. The cardiac silhouette is enlarged. The pulmonary vascularity is engorged. IMPRESSION: The esophagogastric tube's proximal port projects above the GE junction. Advancement by 10 cm is needed. Possible free extraluminal gas within the abdomen though the findings could lie within bowel. An acute abdominal series is recommended. These results will be called to the ordering clinician or representative by the Radiologist Assistant, and communication documented in the PACS or zVision Dashboard. Electronically Signed   By: David  Martinique M.D.   On: 02/05/2017 11:15        Scheduled Meds: . bisacodyl  10 mg Rectal q12n4p  . diclofenac sodium  2 g Topical Daily  . enoxaparin (LOVENOX) injection  30 mg Subcutaneous Q24H  . insulin aspart  0-15 Units Subcutaneous Q6H  . LORazepam  0.5 mg Intravenous QHS  . mupirocin ointment   Nasal BID  . sodium chloride  500 mL Intravenous Once   Continuous Infusions: . Marland KitchenTPN (CLINIMIX-E) Adult 80 mL/hr (02/05/17 1726)  . Marland KitchenTPN (CLINIMIX-E) Adult     And  . fat emulsion    . sodium chloride 125 mL/hr at 02/06/17 1105     LOS: 13 days    Time spent: 25 minutes. Greater than 50% of this time was  spent in direct contact with the patient coordinating care.     Lelon Frohlich, MD [facility] Pager [phone]  If 7PM-7AM, please contact night-coverage [email or web address] Password [email or web address] 02/06/2017, 4:25 PM

## 2017-02-06 NOTE — Progress Notes (Addendum)
Follow-up CT scan of the abdomen reveals contrast extravasation to be from small bowel.    We will undergo IR percutaneous drainage of fluid tomorrow.  We will discuss with family members.  Patient is stable for transport for procedure.  He will return back to [facility].    As patient is only on postoperative day 5, surgical intervention would be difficult until the drainage is under control.

## 2017-02-06 NOTE — Progress Notes (Signed)
Patient ID: Joshua Downs, male   DOB: 01/14/1931, 83 y.o.   MRN: [id number]  Scheduled for abscess drain to be performed in IR at [facility] 4/11  We will call RN in am for time pt needs to be in Radiology

## 2017-02-07 ENCOUNTER — Other Ambulatory Visit: Payer: Self-pay | Admitting: Radiology

## 2017-02-07 DIAGNOSIS — R188 Other ascites: Secondary | ICD-10-CM

## 2017-02-07 DIAGNOSIS — R140 Abdominal distension (gaseous): Secondary | ICD-10-CM

## 2017-02-07 LAB — GLUCOSE, CAPILLARY
Glucose-Capillary: 123 mg/dL — ABNORMAL HIGH (ref 65–99)
Glucose-Capillary: 145 mg/dL — ABNORMAL HIGH (ref 65–99)
Glucose-Capillary: 155 mg/dL — ABNORMAL HIGH (ref 65–99)
Glucose-Capillary: 171 mg/dL — ABNORMAL HIGH (ref 65–99)
Glucose-Capillary: 189 mg/dL — ABNORMAL HIGH (ref 65–99)

## 2017-02-07 LAB — COMPREHENSIVE METABOLIC PANEL
ALBUMIN: 1.4 g/dL — AB (ref 3.5–5.0)
ALK PHOS: 49 U/L (ref 38–126)
ALT: 20 U/L (ref 17–63)
ANION GAP: 7 (ref 5–15)
AST: 26 U/L (ref 15–41)
BILIRUBIN TOTAL: 0.8 mg/dL (ref 0.3–1.2)
BUN: 72 mg/dL — AB (ref 6–20)
CO2: 33 mmol/L — AB (ref 22–32)
Calcium: 7.5 mg/dL — ABNORMAL LOW (ref 8.9–10.3)
Chloride: 99 mmol/L — ABNORMAL LOW (ref 101–111)
Creatinine, Ser: 1.49 mg/dL — ABNORMAL HIGH (ref 0.61–1.24)
GFR calc Af Amer: 48 mL/min — ABNORMAL LOW (ref >60)
GFR calc non Af Amer: 41 mL/min — ABNORMAL LOW (ref >60)
GLUCOSE: 165 mg/dL — AB (ref 65–99)
POTASSIUM: 3.4 mmol/L — AB (ref 3.5–5.1)
SODIUM: 139 mmol/L (ref 135–145)
TOTAL PROTEIN: 4.7 g/dL — AB (ref 6.5–8.1)

## 2017-02-07 LAB — CBC WITH DIFFERENTIAL/PLATELET
BASOS ABS: 0 10*3/uL (ref 0.0–0.1)
Basophils Relative: 0 %
EOS ABS: 0.1 10*3/uL (ref 0.0–0.7)
Eosinophils Relative: 2 %
HCT: 33.9 % — ABNORMAL LOW (ref 39.0–52.0)
Hemoglobin: 11.5 g/dL — ABNORMAL LOW (ref 13.0–17.0)
LYMPHS ABS: 0.6 10*3/uL — AB (ref 0.7–4.0)
Lymphocytes Relative: 8 %
MCH: 28.9 pg (ref 26.0–34.0)
MCHC: 33.9 g/dL (ref 30.0–36.0)
MCV: 85.2 fL (ref 78.0–100.0)
MONO ABS: 0.5 10*3/uL (ref 0.1–1.0)
Monocytes Relative: 7 %
NEUTROS PCT: 83 %
Neutro Abs: 6.1 10*3/uL (ref 1.7–7.7)
PLATELETS: 198 10*3/uL (ref 150–400)
RBC: 3.98 MIL/uL — AB (ref 4.22–5.81)
RDW: 15.3 % (ref 11.5–15.5)
WBC Morphology: INCREASED
WBC: 7.3 10*3/uL (ref 4.0–10.5)

## 2017-02-07 LAB — PROTIME-INR
INR: 1.23
Prothrombin Time: 15.6 seconds — ABNORMAL HIGH (ref 11.4–15.2)

## 2017-02-07 MED ORDER — TRACE MINERALS CR-CU-MN-SE-ZN 10-1000-500-60 MCG/ML IV SOLN
80.0000 mL/h | INTRAVENOUS | Status: AC
  Administered 2017-02-07: 18:00:00 via INTRAVENOUS
  Filled 2017-02-07 (×2): qty 1920

## 2017-02-07 MED ORDER — FAT EMULSION 20 % IV EMUL
250.0000 mL | INTRAVENOUS | Status: AC
  Administered 2017-02-07: 250 mL via INTRAVENOUS
  Filled 2017-02-07: qty 250

## 2017-02-07 NOTE — Progress Notes (Signed)
6 Days Post-Op  Subjective: Patient easily arousable. No complaints noted.  Objective: Vital signs in last 24 hours: Temp:  [97.7 F (36.5 C)-98.6 F (37 C)] 97.7 F (36.5 C) (04/11 0627) Pulse Rate:  [69-106] 106 (04/11 0627) Resp:  [18-20] 20 (04/11 0627) BP: (100-122)/(63-75) 122/72 (04/11 0627) SpO2:  [91 %-97 %] 97 % (04/11 0627) Last BM Date: 01/30/17  Intake/Output from previous day: 04/10 0701 - 04/11 0700 In: 5145.3 [I.V.:5045.3; IV Piggyback:100] Out: 400 [Emesis/NG output:400] Intake/Output this shift: Total I/O In: -  Out: 150 [Urine:150]  General appearance: cooperative and no distress GI: Soft but distended. Incision healing well. Bowel sounds active. No rigidity noted.  Lab Results:   Recent Labs  02/06/17 0414 02/07/17 0410  WBC 7.4 7.3  HGB 12.9* 11.5*  HCT 38.9* 33.9*  PLT 218 198   BMET  Recent Labs  02/06/17 0414 02/07/17 0410  NA 136 139  K 3.7 3.4*  CL 95* 99*  CO2 32 33*  GLUCOSE 156* 165*  BUN 69* 72*  CREATININE 1.75* 1.49*  CALCIUM 7.6* 7.5*   PT/INR  Recent Labs  02/07/17 0410  LABPROT 15.6*  INR 1.23    Studies/Results: Ct Abdomen Pelvis Wo Contrast  Result Date: 02/06/2017 CLINICAL DATA:  Follow-up surgical pneumoperitoneum EXAM: CT ABDOMEN AND PELVIS WITHOUT CONTRAST TECHNIQUE: Multidetector CT imaging of the abdomen and pelvis was performed following the standard protocol without IV contrast. COMPARISON:  02/05/2017 FINDINGS: Lower chest: Lung bases are clear. Small right pleural effusion, mildly increased. Associated right lower lobe opacity, likely compressive atelectasis. Hepatobiliary: Unenhanced liver is grossly unremarkable. Cholelithiasis, without associated inflammatory changes. Pancreas: Within normal limits. Spleen: Within normal limits. Adrenals/Urinary Tract: Adrenal glands are within normal limits. Small right renal cysts measuring up to 2.0 cm. Left kidney is unremarkable. No renal, ureteral, or bladder  calculi. No hydronephrosis. Bladder is grossly unremarkable. Stomach/Bowel: Stomach is grossly unremarkable. No evidence of bowel obstruction. Suture line from small bowel resection in the left lower abdomen (series 2/image 56). Sigmoid diverticulosis, without evidence of diverticulitis. Fluid/gas collection arising from the right lower abdomen near the cecum (series 2/ image 48), measuring approximately 9.1 x 27.4 cm in greatest axial dimension of the level of the mid abdomen (series 2/ image 39), and extending superiorly with mottled gas/debris along the right hepatic dome (series 2/image 7). This has mildly increased from the recent prior and is worrisome for perforation with extravasation of hyperdense fluid/ contrast (series 2/ image 42). Vascular/Lymphatic: No evidence of abdominal aortic aneurysm. Atherosclerotic calcifications of the abdominal aorta and branch vessels. No suspicious abdominopelvic lymphadenopathy. Reproductive: Prostatomegaly. Other: Additional small to moderate mesenteric/ pelvic ascites. Musculoskeletal: Degenerative changes of the visualized thoracolumbar spine. Status post L2-5 PLIF. IMPRESSION: Fluid/gas collection arising from the right lower abdomen near the cecum, extending into the anterior mid abdomen, and superiorly along the right hepatic dome. Suspected extravasation of contrast into the collection, suggesting bowel perforation. This has progressed from the recent prior. Postsurgical changes related to prior bowel resection in the left lower abdomen. No evidence of bowel obstruction. Additional small to moderate mesenteric/ pelvic ascites. Small right pleural effusion. Associated right lower lobe opacity, likely atelectasis. Additional ancillary findings as above. These results were called by telephone at the time of interpretation on 02/06/2017 at 4:10 pm to Dr. Aviva Signs , who verbally acknowledged these results. Electronically Signed   By: Julian Hy M.D.   On:  02/06/2017 16:13   Ct Abdomen Pelvis Wo Contrast  Result Date:  02/05/2017 CLINICAL DATA:  Small bowel obstruction. Status post laparotomy and lysis of adhesions on 02/14/2017. EXAM: CT ABDOMEN AND PELVIS WITHOUT CONTRAST TECHNIQUE: Multidetector CT imaging of the abdomen and pelvis was performed following the standard protocol without IV contrast. COMPARISON:  01/31/2017 FINDINGS: Lower chest: New small right pleural effusion with overlying dependent atelectasis in the lower lobe. Milder dependent atelectasis in the left lower lobe. Coronary artery atherosclerosis. Venous catheter tip in the lower SVC. Hepatobiliary: No focal liver abnormality is seen on this unenhanced study. Unchanged gallstones. No biliary dilatation. Pancreas: Unremarkable. Spleen: Unremarkable. Adrenals/Urinary Tract: Unremarkable adrenal glands. No hydronephrosis. Unchanged right renal cysts measuring up to 2.1 cm in size. Unremarkable bladder. Stomach/Bowel: Enteric tube terminates in the gastric body. Small bowel dilatation has greatly decreased from the prior CT. Oral contrast is present in multiple loops of proximal and mid small bowel which remain mildly dilated up to approximately 4 cm in diameter. There is a transition to decompressed distal small bowel in the left mid to lower abdomen at the site of new suture material (series 2, image 68). A small amount of gas is present in the transverse colon. The colon is otherwise decompressed. There is sigmoid colon diverticulosis without definite evidence of acute diverticulitis. No extraluminal contrast material is identified. Vascular/Lymphatic: Abdominal aortic atherosclerosis without aneurysm. No enlarged lymph nodes. Reproductive: Mild prostatic enlargement. Other: Postsurgical changes from interval exploratory laparotomy with skin staples in the anterior abdominal wall. Large volume intraperitoneal free air with moderate volume intraperitoneal free fluid. Two large gas and fluid  collections in the upper abdomen anterior to the colon measuring 12-15 cm each. Diffuse subcutaneous fat stranding, mildly asymmetric in the right flank region. Musculoskeletal: Left iliopsoas bursal fluid and calcification as previously seen. Prior L2-L5 fusion. Advanced disc degeneration at L1-2. IMPRESSION: 1. Postoperative changes from interval laparotomy. Large amount of intraperitoneal free air and moderate volume free fluid. Large gas and fluid collections in the upper abdomen. 2. Mild small bowel dilatation, improved from the prior study though with transition point in the left mid to lower abdomen concerning for residual/recurrent obstruction. 3. New small right pleural effusion.  Bibasilar atelectasis. 4. Aortic atherosclerosis. Electronically Signed   By: Logan Bores M.D.   On: 02/05/2017 15:43   Dg Chest 1 View  Result Date: 02/06/2017 CLINICAL DATA:  History of renal malignancy, recent lysis of adhesions EXAM: CHEST 1 VIEW COMPARISON:  Portable chest x-ray of February 05, 2017 FINDINGS: There remains gas-filled bowel under the right hemidiaphragm. There are other loops of gas-filled bowel noted under the left hemidiaphragm. The cardiac silhouette is mildly enlarged. The pulmonary vascularity is prominent centrally. The interstitial markings are mildly prominent though stable. The elevated right hemidiaphragm decreases the excursion of the right lung. There is calcification in the wall of the aortic arch. The nasogastric tube tip lies below the inferior margin of the image. There is a portion of the proximal port seen in the region of the pylorus. IMPRESSION: Hypoinflation of the right lung due to elevation of the hemidiaphragm secondary to presumed gas-filled bowel under this diaphragm. Free intra abdominal air from the laparotomy 5 days ago would likely be reabsorbed by now. Stable cardiomegaly. Thoracic aortic atherosclerosis. An acute abdominal series is recommended to further evaluate the  subdiaphragmatic structures and assure that no significant free extraluminal gas is in the present. This would also allow assessment of the positioning of the tip of the nasogastric tube. Electronically Signed   By: David  Martinique M.D.  On: 02/06/2017 07:31   Dg Abd 1 View  Result Date: 02/06/2017 CLINICAL DATA:  NG tube placement. EXAM: ABDOMEN - 1 VIEW COMPARISON:  CT abdomen and pelvis this same day. Single-view of the abdomen earlier today. FINDINGS: NG tube is in place with the tip in the mid to distal body of the stomach. Gaseous distention of bowel is noted. Midline surgical staples are noted. IMPRESSION: NG tube in good position. Electronically Signed   By: Inge Rise M.D.   On: 02/06/2017 20:09   Dg Abd 1 View  Result Date: 02/06/2017 CLINICAL DATA:  NG tube placement. EXAM: ABDOMEN - 1 VIEW COMPARISON:  02/04/2017 FINDINGS: NGT tip is in the proximal duodenum. Air in multiple large and small bowel loops with diminished distention of small bowel since the prior study. Contrast remains in the ascending colon. Multiple gallstones. IMPRESSION: NG tube tip in the duodenum.  Improved distention of small bowel. Electronically Signed   By: Lorriane Shire M.D.   On: 02/06/2017 10:06   Dg Chest Port 1 View  Result Date: 02/05/2017 CLINICAL DATA:  Assess positioning of the nasogastric tube EXAM: PORTABLE CHEST 1 VIEW COMPARISON:  Portable chest x-ray of January 27, 2017 FINDINGS: The nasogastric tubes proximal port projects above the GE junction. The tip projects below the inferior margin of the image. There is gas under the left hemidiaphragm which may lie within bowel but free air is not excluded. There is some gas in the upper quadrant on the left. The cardiac silhouette is enlarged. The pulmonary vascularity is engorged. IMPRESSION: The esophagogastric tube's proximal port projects above the GE junction. Advancement by 10 cm is needed. Possible free extraluminal gas within the abdomen though the  findings could lie within bowel. An acute abdominal series is recommended. These results will be called to the ordering clinician or representative by the Radiologist Assistant, and communication documented in the PACS or zVision Dashboard. Electronically Signed   By: David  Martinique M.D.   On: 02/05/2017 11:15    Anti-infectives: Anti-infectives    Start     Dose/Rate Route Frequency Ordered Stop   02/06/17 1700  piperacillin-tazobactam (ZOSYN) IVPB 3.375 g     3.375 g 12.5 mL/hr over 240 Minutes Intravenous Every 8 hours 02/06/17 1629     02/02/17 0900  cefTRIAXone (ROCEPHIN) 2 g in dextrose 5 % 50 mL IVPB     2 g 100 mL/hr over 30 Minutes Intravenous  Once 02/05/2017 1429 02/02/17 0930   02/08/2017 1730  metroNIDAZOLE (FLAGYL) IVPB 500 mg     500 mg 100 mL/hr over 60 Minutes Intravenous Every 8 hours 01/28/2017 1429 02/02/17 0325   02/20/2017 0822  cefTRIAXone (ROCEPHIN) 2 g in dextrose 5 % 50 mL IVPB     2 g 100 mL/hr over 30 Minutes Intravenous On call to O.R. 01/28/2017 4854 02/18/2017 0920   02/04/2017 6270  metroNIDAZOLE (FLAGYL) IVPB 500 mg  Status:  Discontinued     500 mg 100 mL/hr over 60 Minutes Intravenous Every 8 hours 02/04/2017 0822 01/30/2017 1330      Assessment/Plan: s/p Procedure(s): EXPLORATORY LAPAROTOMY LYSIS OF ADHESION Impression: Small bowel leak, but patient not septic. Patient scheduled for interventional radiologic drainage. Further management pending those results.  LOS: 14 days    Aviva Signs 02/07/2017

## 2017-02-07 NOTE — Progress Notes (Signed)
PROGRESS NOTE    Joshua Downs  XQJ:[id number] DOB: [birth year] DOA: 01/02/2017 PCP: Deloria Lair, MD    Brief Narrative:  83 year old man initially admitted to the hospital on 3/28 due to abdominal pain and found to have a small bowel obstruction. CT scan of the abdomen showed a high-grade small bowel obstruction with transition point in the mid abdomen. Was also noted to have acute kidney injury. NG tube was placed in the emergency department, surgical services were consulted. NG tube was initially removed on 3/30 and was reinserted on the same day due to recurrence of distention. NG tube was again removed on 4/3 due to patient's symptoms improving, unfortunately he has failed again with increased vomiting and NG tube has been replaced on 4/4. OR on 4/5 for ex lap with lysis of adhesions. Very slow to progress post op. Still with abd pain and distention. CT today shows extravasatioin of contrast concerning for bowel perforation. He was going to get IR drainage, but it was defer until tomorrow.  He is stable, alert, with stable hemodynamics, and no leukocytosis.  He is currently on TPN.     Assessment & Plan:   Principal Problem:   Small bowel obstruction Active Problems:   Acute renal failure (ARF) (HCC)   Hyperglycemia   Protein-calorie malnutrition, severe   Malnutrition of moderate degree   Small bowel obstruction -He has failed conservative management and went to OR on 4/5 for ex lap and lysis of adhesions. -Follow surgical recommendations: keep NG in today.  He is still having significant abdominal distention and hypertympani.  -Continue TPN. -CT scan 4/9 with large amount of free air but no leakage of contrast. D/W Dr. Arnoldo Morale plan to redo CT in am and after that recommendations will follow. -Repeat CT 4/10 with extraluminal fluid/gas with extravasation of contrast concerning for bowel perforation. Dr. Arnoldo Morale aware. IR plan to place drainage tube tomorrow.  Continue with Zosyn.   No fever, or leukocytosys.  -Still has not had return of bowel function.  Severe protein caloric malnutrition -Continue TPN. -Appreciate dietitian and pharmacy recommendations.  Hypernatremia -Resolved.  Hypertension -By mouth meds on hold given nothing by mouth state. -BP remains low normal.  Acute on CKD Stage III -Baseline Cr aroung 1.1-1.2. -Cr maxed at 2.12. -down to 1.75 on 4/10. Now at 1.49. -Will provide another fluid bolus. I believe this is prerenal azotemia given prolonged NPO state and GI losses from NG suction.   Wide Complex Tachycardia -Discussed via phone with cards, Dr. Meda Coffee and also with Dr. Bronson Ing. -Appears to be simply sinus tach with a wide QRS due to his RBBB. -After fluid bolus, HR has returned to 80s-90s.   DVT prophylaxis: Lovenox Code Status: Full code Family Communication: Sons at bedside updated on plan of care and all questions answered on 4/9. Disposition Plan: To be determined pending medical stability.  Consultants:   Surgery.   Procedures:   Exp Lap with lysis of adhesion.   Antimicrobials: Anti-infectives    Start     Dose/Rate Route Frequency Ordered Stop   02/06/17 1700  piperacillin-tazobactam (ZOSYN) IVPB 3.375 g     3.375 g 12.5 mL/hr over 240 Minutes Intravenous Every 8 hours 02/06/17 1629     02/02/17 0900  cefTRIAXone (ROCEPHIN) 2 g in dextrose 5 % 50 mL IVPB     2 g 100 mL/hr over 30 Minutes Intravenous  Once 02/23/2017 1429 02/02/17 0930   02/06/2017 1730  metroNIDAZOLE (FLAGYL) IVPB 500 mg  500 mg 100 mL/hr over 60 Minutes Intravenous Every 8 hours 02/06/2017 1429 02/02/17 0325   02/06/2017 0822  cefTRIAXone (ROCEPHIN) 2 g in dextrose 5 % 50 mL IVPB     2 g 100 mL/hr over 30 Minutes Intravenous On call to O.R. 01/29/2017 6222 01/30/2017 0920   02/04/2017 0822  metroNIDAZOLE (FLAGYL) IVPB 500 mg  Status:  Discontinued     500 mg 100 mL/hr over 60 Minutes Intravenous Every 8 hours 02/18/2017 0822 02/15/2017 1330        Subjective: No complaints.   Objective: Vitals:   02/06/17 1432 02/06/17 2012 02/06/17 2200 02/07/17 0627  BP: 113/63  100/75 122/72  Pulse: 82  69 (!) 106  Resp: 18  20 20   Temp: 98.6 F (37 C)  98.4 F (36.9 C) 97.7 F (36.5 C)  TempSrc: Oral  Oral Oral  SpO2: 91% 94% 97% 97%  Weight:      Height:        Intake/Output Summary (Last 24 hours) at 02/07/17 1247 Last data filed at 02/07/17 0745  Gross per 24 hour  Intake          4153.58 ml  Output              550 ml  Net          3603.58 ml   Filed Weights   01/27/2017 1204 01/10/2017 1807  Weight: 76.2 kg (168 lb) 73.2 kg (161 lb 6.4 oz)    Examination:  General exam: Appears calm and comfortable  Respiratory system: Clear to auscultation. Respiratory effort normal. Cardiovascular system: S1 & S2 heard, RRR. No JVD, murmurs, rubs, gallops or clicks. No pedal edema. Gastrointestinal system: Abdomen is distended with hypertypani.  No organomegaly or masses felt. Normal bowel sounds heard. Central nervous system: Alert and oriented. No focal neurological deficits. Extremities: Symmetric 5 x 5 power.  Back with no evidence of infection at surgical site.  Skin: No rashes, lesions or ulcers Psychiatry: Judgement and insight appear normal. Mood & affect appropriate.   Data Reviewed: I have personally reviewed following labs and imaging studies  CBC:  Recent Labs Lab 02/09/2017 0520 02/03/17 0636 02/05/17 0414 02/06/17 0414 02/07/17 0410  WBC 9.7 9.3 6.5 7.4 7.3  NEUTROABS 7.1  --  5.3  --  6.1  HGB 14.6 13.4 12.9* 12.9* 11.5*  HCT 43.4 40.5 38.5* 38.9* 33.9*  MCV 86.3 86.0 85.6 85.9 85.2  PLT 286 209 253 218 979   Basic Metabolic Panel:  Recent Labs Lab 02/18/2017 0520 02/02/17 0518 02/03/17 0636 02/04/17 1134 02/05/17 0414 02/06/17 0414 02/07/17 0410  NA 132* 132* 134* 136 135 136 139  K 3.9 4.7 3.8 4.3 4.2 3.7 3.4*  CL 86* 92* 96* 93* 97* 95* 99*  CO2 36* 32 30 33* 28 32 33*  GLUCOSE 177* 170*  164* 155* 131* 156* 165*  BUN 41* 43* 34* 48* 63* 69* 72*  CREATININE 1.80* 2.00* 1.51* 1.81* 2.12* 1.75* 1.49*  CALCIUM 8.6* 7.8* 7.8* 8.6* 7.9* 7.6* 7.5*  MG 2.0 1.6*  --   --  1.9 1.9  --   PHOS 4.7* 4.8*  --   --  4.0 4.6  --    GFR: Estimated Creatinine Clearance: 38.2 mL/min (A) (by C-G formula based on SCr of 1.49 mg/dL (H)). Liver Function Tests:  Recent Labs Lab 01/31/2017 0520 02/02/17 0518 02/05/17 0414 02/07/17 0410  AST 29 20 20 26   ALT 57 33 15* 20  ALKPHOS 73  52 31* 49  BILITOT 0.8 0.7 0.5 0.8  PROT 6.8 5.5* 5.2* 4.7*  ALBUMIN 2.9* 2.2* 1.7* 1.4*   Coagulation Profile:  Recent Labs Lab 02/07/17 0410  INR 1.23   CBG:  Recent Labs Lab 02/06/17 1657 02/06/17 2230 02/07/17 0005 02/07/17 0557 02/07/17 1149  GLUCAP 129* 156* 171* 189* 145*   Lipid Profile:  Recent Labs  02/05/17 0414  TRIG 165*     Recent Results (from the past 240 hour(s))  Surgical pcr screen     Status: Abnormal   Collection Time: 01/31/17 11:46 PM  Result Value Ref Range Status   MRSA, PCR NEGATIVE NEGATIVE Final   Staphylococcus aureus POSITIVE (A) NEGATIVE Final    Comment:        The Xpert SA Assay (FDA approved for NASAL specimens in patients over 64 years of age), is one component of a comprehensive surveillance program.  Test performance has been validated by [facility] for patients greater than or equal to 96 year old. It is not intended to diagnose infection nor to guide or monitor treatment. RESULT CALLED TO, READ BACK BY AND VERIFIED WITH:  TETREAULT,H @ 8315 ON 02/17/2017 BY JUW      Radiology Studies: Ct Abdomen Pelvis Wo Contrast  Result Date: 02/06/2017 CLINICAL DATA:  Follow-up surgical pneumoperitoneum EXAM: CT ABDOMEN AND PELVIS WITHOUT CONTRAST TECHNIQUE: Multidetector CT imaging of the abdomen and pelvis was performed following the standard protocol without IV contrast. COMPARISON:  02/05/2017 FINDINGS: Lower chest: Lung bases are clear. Small  right pleural effusion, mildly increased. Associated right lower lobe opacity, likely compressive atelectasis. Hepatobiliary: Unenhanced liver is grossly unremarkable. Cholelithiasis, without associated inflammatory changes. Pancreas: Within normal limits. Spleen: Within normal limits. Adrenals/Urinary Tract: Adrenal glands are within normal limits. Small right renal cysts measuring up to 2.0 cm. Left kidney is unremarkable. No renal, ureteral, or bladder calculi. No hydronephrosis. Bladder is grossly unremarkable. Stomach/Bowel: Stomach is grossly unremarkable. No evidence of bowel obstruction. Suture line from small bowel resection in the left lower abdomen (series 2/image 56). Sigmoid diverticulosis, without evidence of diverticulitis. Fluid/gas collection arising from the right lower abdomen near the cecum (series 2/ image 48), measuring approximately 9.1 x 27.4 cm in greatest axial dimension of the level of the mid abdomen (series 2/ image 39), and extending superiorly with mottled gas/debris along the right hepatic dome (series 2/image 7). This has mildly increased from the recent prior and is worrisome for perforation with extravasation of hyperdense fluid/ contrast (series 2/ image 42). Vascular/Lymphatic: No evidence of abdominal aortic aneurysm. Atherosclerotic calcifications of the abdominal aorta and branch vessels. No suspicious abdominopelvic lymphadenopathy. Reproductive: Prostatomegaly. Other: Additional small to moderate mesenteric/ pelvic ascites. Musculoskeletal: Degenerative changes of the visualized thoracolumbar spine. Status post L2-5 PLIF. IMPRESSION: Fluid/gas collection arising from the right lower abdomen near the cecum, extending into the anterior mid abdomen, and superiorly along the right hepatic dome. Suspected extravasation of contrast into the collection, suggesting bowel perforation. This has progressed from the recent prior. Postsurgical changes related to prior bowel resection in  the left lower abdomen. No evidence of bowel obstruction. Additional small to moderate mesenteric/ pelvic ascites. Small right pleural effusion. Associated right lower lobe opacity, likely atelectasis. Additional ancillary findings as above. These results were called by telephone at the time of interpretation on 02/06/2017 at 4:10 pm to Dr. Aviva Signs , who verbally acknowledged these results. Electronically Signed   By: Julian Hy M.D.   On: 02/06/2017 16:13   Ct Abdomen  Pelvis Wo Contrast  Result Date: 02/05/2017 CLINICAL DATA:  Small bowel obstruction. Status post laparotomy and lysis of adhesions on 02/04/2017. EXAM: CT ABDOMEN AND PELVIS WITHOUT CONTRAST TECHNIQUE: Multidetector CT imaging of the abdomen and pelvis was performed following the standard protocol without IV contrast. COMPARISON:  01/31/2017 FINDINGS: Lower chest: New small right pleural effusion with overlying dependent atelectasis in the lower lobe. Milder dependent atelectasis in the left lower lobe. Coronary artery atherosclerosis. Venous catheter tip in the lower SVC. Hepatobiliary: No focal liver abnormality is seen on this unenhanced study. Unchanged gallstones. No biliary dilatation. Pancreas: Unremarkable. Spleen: Unremarkable. Adrenals/Urinary Tract: Unremarkable adrenal glands. No hydronephrosis. Unchanged right renal cysts measuring up to 2.1 cm in size. Unremarkable bladder. Stomach/Bowel: Enteric tube terminates in the gastric body. Small bowel dilatation has greatly decreased from the prior CT. Oral contrast is present in multiple loops of proximal and mid small bowel which remain mildly dilated up to approximately 4 cm in diameter. There is a transition to decompressed distal small bowel in the left mid to lower abdomen at the site of new suture material (series 2, image 68). A small amount of gas is present in the transverse colon. The colon is otherwise decompressed. There is sigmoid colon diverticulosis without  definite evidence of acute diverticulitis. No extraluminal contrast material is identified. Vascular/Lymphatic: Abdominal aortic atherosclerosis without aneurysm. No enlarged lymph nodes. Reproductive: Mild prostatic enlargement. Other: Postsurgical changes from interval exploratory laparotomy with skin staples in the anterior abdominal wall. Large volume intraperitoneal free air with moderate volume intraperitoneal free fluid. Two large gas and fluid collections in the upper abdomen anterior to the colon measuring 12-15 cm each. Diffuse subcutaneous fat stranding, mildly asymmetric in the right flank region. Musculoskeletal: Left iliopsoas bursal fluid and calcification as previously seen. Prior L2-L5 fusion. Advanced disc degeneration at L1-2. IMPRESSION: 1. Postoperative changes from interval laparotomy. Large amount of intraperitoneal free air and moderate volume free fluid. Large gas and fluid collections in the upper abdomen. 2. Mild small bowel dilatation, improved from the prior study though with transition point in the left mid to lower abdomen concerning for residual/recurrent obstruction. 3. New small right pleural effusion.  Bibasilar atelectasis. 4. Aortic atherosclerosis. Electronically Signed   By: Logan Bores M.D.   On: 02/05/2017 15:43   Dg Chest 1 View  Result Date: 02/06/2017 CLINICAL DATA:  History of renal malignancy, recent lysis of adhesions EXAM: CHEST 1 VIEW COMPARISON:  Portable chest x-ray of February 05, 2017 FINDINGS: There remains gas-filled bowel under the right hemidiaphragm. There are other loops of gas-filled bowel noted under the left hemidiaphragm. The cardiac silhouette is mildly enlarged. The pulmonary vascularity is prominent centrally. The interstitial markings are mildly prominent though stable. The elevated right hemidiaphragm decreases the excursion of the right lung. There is calcification in the wall of the aortic arch. The nasogastric tube tip lies below the inferior  margin of the image. There is a portion of the proximal port seen in the region of the pylorus. IMPRESSION: Hypoinflation of the right lung due to elevation of the hemidiaphragm secondary to presumed gas-filled bowel under this diaphragm. Free intra abdominal air from the laparotomy 5 days ago would likely be reabsorbed by now. Stable cardiomegaly. Thoracic aortic atherosclerosis. An acute abdominal series is recommended to further evaluate the subdiaphragmatic structures and assure that no significant free extraluminal gas is in the present. This would also allow assessment of the positioning of the tip of the nasogastric tube. Electronically Signed  By: David  Martinique M.D.   On: 02/06/2017 07:31   Dg Abd 1 View  Result Date: 02/06/2017 CLINICAL DATA:  NG tube placement. EXAM: ABDOMEN - 1 VIEW COMPARISON:  CT abdomen and pelvis this same day. Single-view of the abdomen earlier today. FINDINGS: NG tube is in place with the tip in the mid to distal body of the stomach. Gaseous distention of bowel is noted. Midline surgical staples are noted. IMPRESSION: NG tube in good position. Electronically Signed   By: Inge Rise M.D.   On: 02/06/2017 20:09   Dg Abd 1 View  Result Date: 02/06/2017 CLINICAL DATA:  NG tube placement. EXAM: ABDOMEN - 1 VIEW COMPARISON:  02/04/2017 FINDINGS: NGT tip is in the proximal duodenum. Air in multiple large and small bowel loops with diminished distention of small bowel since the prior study. Contrast remains in the ascending colon. Multiple gallstones. IMPRESSION: NG tube tip in the duodenum.  Improved distention of small bowel. Electronically Signed   By: Lorriane Shire M.D.   On: 02/06/2017 10:06    Scheduled Meds: . bisacodyl  10 mg Rectal q12n4p  . diclofenac sodium  2 g Topical Daily  . insulin aspart  0-15 Units Subcutaneous Q8H  . LORazepam  0.5 mg Intravenous QHS  . mupirocin ointment   Nasal BID  . piperacillin-tazobactam (ZOSYN)  IV  3.375 g Intravenous Q8H   . sodium chloride  500 mL Intravenous Once   Continuous Infusions: . Marland KitchenTPN (CLINIMIX-E) Adult 80 mL/hr (02/06/17 1836)  . Marland KitchenTPN (CLINIMIX-E) Adult     And  . fat emulsion    . sodium chloride 125 mL/hr at 02/07/17 0839     LOS: 14 days   Sallyann Kinnaird, MD [facility]   If 7PM-7AM, please contact night-coverage [email or web address] Password [facility] 02/07/2017, 12:47 PM

## 2017-02-07 NOTE — Progress Notes (Signed)
PT Cancellation Note  Patient Details Name: RANDE ROYLANCE MRN: [id number] DOB: 08-14-1931   Cancelled Treatment:    Reason Eval/Treat Not Completed: Patient not medically ready. Patient is awaiting IR procedure at [facility] scheduled for 4/12 due to small bowel leak, possible future drain placement. Discussed case with attending physician who recommends holding PT until after procedure. Holding PT treatment at this time. Will follow pt progress remotely and re-evaluate patient after procedure. Pt will need new PT order or verbal for continuation of PT services s/p procedure.   2:48 PM, 02/07/17 Etta Grandchild, PT, DPT Physical Therapist - [place] [phone] [facility])  [phone] (mobile)   Maxen Rowland C 02/07/2017, 2:45 PM

## 2017-02-07 NOTE — Progress Notes (Signed)
PHARMACY - ADULT TOTAL PARENTERAL NUTRITION CONSULT NOTE   Pharmacy Consult for TPN Indication: Bowel obstruction, surgery 4/5  Patient Measurements: Height: 6' (182.9 cm) Weight: 161 lb 6.4 oz (73.2 kg) IBW/kg (Calculated) : 77.6 TPN AdjBW (KG): 73.2 Body mass index is 21.89 kg/m.  Assessment: 83 year old male with history of hypertension, remote left kidney carcinoma status post partial nephrectomy, chronic bradycardia, recent back surgery who presented with abdominal pain for past 3 days. Denied any fevers, chills, nausea or vomiting. Denied dysuria or diarrhea.  Pt had surgery on 02/05/2017 for SBO. Patient has still not had return of bowel function. NG tube was replaced. Planning CT scan of abdomen to ensure no other cause for his ileus.  Glucose control improved with BS 132-165.  SCr is starting to improve.    GI: Pt has gone approximately 10 days with minimal to no nutrition. At this time, TPN is warranted if patient shows intolerance to clear liquids.    Insulin requirements in the past 24 hours: none BMP Latest Ref Rng & Units 02/07/2017 02/06/2017 02/05/2017  Glucose 65 - 99 mg/dL 165(H) 156(H) 131(H)  BUN 6 - 20 mg/dL 72(H) 69(H) 63(H)  Creatinine 0.61 - 1.24 mg/dL 1.49(H) 1.75(H) 2.12(H)  Sodium 135 - 145 mmol/L 139 136 135  Potassium 3.5 - 5.1 mmol/L 3.4(L) 3.7 4.2  Chloride 101 - 111 mmol/L 99(L) 95(L) 97(L)  CO2 22 - 32 mmol/L 33(H) 32 28  Calcium 8.9 - 10.3 mg/dL 7.5(L) 7.6(L) 7.9(L)   TPN Access: central IV access TPN start date: 01/31/17 Current Nutrition: none  Nutritional Goals (per RD recommendation on 01/30/2017): Estimated Nutritional Needs:  Kcal:  2050-2250 (28-31 kcal/kg bw) Protein:  88-102 g Pro (1.2-1.4 g/kg bw) Fluid:  >1.8 L (25 ml/kg bw)  Plan:  TPN rate at goal  Continue Clinimix E 5/15 at 64ml/hr  20% lipid emulsion at 44ml/hr (infuse over 12 hours)  Maintenance IV fluids per MD  TPN provides 96 g of protein and total calories: 1843 kCals per day    MVI and trace elements in TPN  Regular Insulin 15 units/bag  Change SSI q8h CBG checks  Monitor TPN labs, renal fxn, fluid balance, glucose tolerance  Hart Robinsons, PharmD Clinical Pharmacist Pager:  [phone] 02/07/2017   02/07/2017,8:03 AM

## 2017-02-07 NOTE — Progress Notes (Signed)
Patient ID: Joshua Downs, male   DOB: May 04, 1931, 83 y.o.   MRN: [id number]  Was scheduled at [facility] today for transgluteal and RUQ abscess drains Unfortunately, IR schedule has had to take on several emergency cases  Must reschedule this pt to 4/12  See new orders RN aware--- will inform pt

## 2017-02-08 ENCOUNTER — Inpatient Hospital Stay (HOSPITAL_COMMUNITY): Payer: Medicare Other

## 2017-02-08 ENCOUNTER — Encounter (HOSPITAL_COMMUNITY): Payer: Self-pay

## 2017-02-08 ENCOUNTER — Ambulatory Visit (HOSPITAL_COMMUNITY)
Admit: 2017-02-08 | Discharge: 2017-02-08 | Disposition: A | Discharge status: Home / Self Care | Payer: Medicare Other | Attending: Radiology | Admitting: Radiology

## 2017-02-08 LAB — GLUCOSE, CAPILLARY
GLUCOSE-CAPILLARY: 128 mg/dL — AB (ref 65–99)
GLUCOSE-CAPILLARY: 153 mg/dL — AB (ref 65–99)
Glucose-Capillary: 149 mg/dL — ABNORMAL HIGH (ref 65–99)

## 2017-02-08 LAB — COMPREHENSIVE METABOLIC PANEL
ALT: 24 U/L (ref 17–63)
AST: 35 U/L (ref 15–41)
Albumin: 1.4 g/dL — ABNORMAL LOW (ref 3.5–5.0)
Alkaline Phosphatase: 88 U/L (ref 38–126)
Anion gap: 6 (ref 5–15)
BUN: 64 mg/dL — ABNORMAL HIGH (ref 6–20)
CO2: 36 mmol/L — ABNORMAL HIGH (ref 22–32)
Calcium: 7.6 mg/dL — ABNORMAL LOW (ref 8.9–10.3)
Chloride: 101 mmol/L (ref 101–111)
Creatinine, Ser: 1.33 mg/dL — ABNORMAL HIGH (ref 0.61–1.24)
GFR calc Af Amer: 55 mL/min — ABNORMAL LOW (ref >60)
GFR calc non Af Amer: 47 mL/min — ABNORMAL LOW (ref >60)
Glucose, Bld: 165 mg/dL — ABNORMAL HIGH (ref 65–99)
Potassium: 3.2 mmol/L — ABNORMAL LOW (ref 3.5–5.1)
Sodium: 143 mmol/L (ref 135–145)
Total Bilirubin: 1.6 mg/dL — ABNORMAL HIGH (ref 0.3–1.2)
Total Protein: 5 g/dL — ABNORMAL LOW (ref 6.5–8.1)

## 2017-02-08 LAB — MAGNESIUM: Magnesium: 2.1 mg/dL (ref 1.7–2.4)

## 2017-02-08 LAB — PHOSPHORUS: Phosphorus: 4.7 mg/dL — ABNORMAL HIGH (ref 2.5–4.6)

## 2017-02-08 MED ORDER — LORAZEPAM 2 MG/ML IJ SOLN
0.5000 mg | Freq: Once | INTRAMUSCULAR | Status: AC
  Administered 2017-02-08: 0.5 mg via INTRAVENOUS
  Filled 2017-02-08: qty 1

## 2017-02-08 MED ORDER — MIDAZOLAM HCL 2 MG/2ML IJ SOLN
INTRAMUSCULAR | Status: AC
  Filled 2017-02-08: qty 2

## 2017-02-08 MED ORDER — FENTANYL CITRATE (PF) 100 MCG/2ML IJ SOLN
INTRAMUSCULAR | Status: AC | PRN
  Administered 2017-02-08 (×2): 50 ug via INTRAVENOUS

## 2017-02-08 MED ORDER — POTASSIUM CHLORIDE 10 MEQ/100ML IV SOLN
10.0000 meq | INTRAVENOUS | Status: AC
  Administered 2017-02-08 (×3): 10 meq via INTRAVENOUS
  Filled 2017-02-08: qty 100

## 2017-02-08 MED ORDER — FAT EMULSION 20 % IV EMUL
250.0000 mL | INTRAVENOUS | Status: AC
  Administered 2017-02-08: 250 mL via INTRAVENOUS
  Filled 2017-02-08: qty 250

## 2017-02-08 MED ORDER — MIDAZOLAM HCL 2 MG/2ML IJ SOLN
INTRAMUSCULAR | Status: AC | PRN
  Administered 2017-02-08: 1 mg via INTRAVENOUS

## 2017-02-08 MED ORDER — TRACE MINERALS CR-CU-MN-SE-ZN 10-1000-500-60 MCG/ML IV SOLN
80.0000 mL/h | INTRAVENOUS | Status: AC
  Administered 2017-02-08: 18:00:00 via INTRAVENOUS
  Filled 2017-02-08: qty 1920

## 2017-02-08 MED ORDER — LIDOCAINE HCL 1 % IJ SOLN
INTRAMUSCULAR | Status: AC
  Filled 2017-02-08: qty 20

## 2017-02-08 MED ORDER — FENTANYL CITRATE (PF) 100 MCG/2ML IJ SOLN
INTRAMUSCULAR | Status: AC
  Filled 2017-02-08: qty 2

## 2017-02-08 NOTE — Sedation Documentation (Signed)
Patient is resting comfortably. 

## 2017-02-08 NOTE — Care Management Note (Signed)
Case Management Note  Patient Details  Name: MEKAI WILKINSON MRN: [id number] Date of Birth: 07-28-1931  If discussed at [place] Length of Stay Meetings, dates discussed:  02/08/2017  Sherald Barge, RN 02/08/2017, 12:51 PM

## 2017-02-08 NOTE — Progress Notes (Signed)
7 Days Post-Op  Subjective: Patient is somewhat somnolent but easily arousable.  Objective: Vital signs in last 24 hours: Temp:  [97.7 F (36.5 C)-98.2 F (36.8 C)] 97.8 F (36.6 C) (04/12 0700) Pulse Rate:  [100-110] 100 (04/12 0700) Resp:  [20-24] 20 (04/12 0700) BP: (100-120)/(66-70) 120/70 (04/12 0700) SpO2:  [97 %-100 %] 100 % (04/12 0700) Last BM Date: 02/07/17  Intake/Output from previous day: 04/11 0701 - 04/12 0700 In: 2163.7 [I.V.:2063.7; IV Piggyback:100] Out: 3075 [Urine:875; Emesis/NG output:2200] Intake/Output this shift: Total I/O In: -  Out: 175 [Urine:175]  General appearance: cooperative and appears stated age GI: Soft, distended. No significant changes noted. Active bowel sounds appreciated. Incision healing well.  Lab Results:   Recent Labs  02/06/17 0414 02/07/17 0410  WBC 7.4 7.3  HGB 12.9* 11.5*  HCT 38.9* 33.9*  PLT 218 198   BMET  Recent Labs  02/07/17 0410 02/08/17 0530  NA 139 143  K 3.4* 3.2*  CL 99* 101  CO2 33* 36*  GLUCOSE 165* 165*  BUN 72* 64*  CREATININE 1.49* 1.33*  CALCIUM 7.5* 7.6*   PT/INR  Recent Labs  02/07/17 0410  LABPROT 15.6*  INR 1.23    Studies/Results: Ct Abdomen Pelvis Wo Contrast  Result Date: 02/06/2017 CLINICAL DATA:  Follow-up surgical pneumoperitoneum EXAM: CT ABDOMEN AND PELVIS WITHOUT CONTRAST TECHNIQUE: Multidetector CT imaging of the abdomen and pelvis was performed following the standard protocol without IV contrast. COMPARISON:  02/05/2017 FINDINGS: Lower chest: Lung bases are clear. Small right pleural effusion, mildly increased. Associated right lower lobe opacity, likely compressive atelectasis. Hepatobiliary: Unenhanced liver is grossly unremarkable. Cholelithiasis, without associated inflammatory changes. Pancreas: Within normal limits. Spleen: Within normal limits. Adrenals/Urinary Tract: Adrenal glands are within normal limits. Small right renal cysts measuring up to 2.0 cm. Left kidney  is unremarkable. No renal, ureteral, or bladder calculi. No hydronephrosis. Bladder is grossly unremarkable. Stomach/Bowel: Stomach is grossly unremarkable. No evidence of bowel obstruction. Suture line from small bowel resection in the left lower abdomen (series 2/image 56). Sigmoid diverticulosis, without evidence of diverticulitis. Fluid/gas collection arising from the right lower abdomen near the cecum (series 2/ image 48), measuring approximately 9.1 x 27.4 cm in greatest axial dimension of the level of the mid abdomen (series 2/ image 39), and extending superiorly with mottled gas/debris along the right hepatic dome (series 2/image 7). This has mildly increased from the recent prior and is worrisome for perforation with extravasation of hyperdense fluid/ contrast (series 2/ image 42). Vascular/Lymphatic: No evidence of abdominal aortic aneurysm. Atherosclerotic calcifications of the abdominal aorta and branch vessels. No suspicious abdominopelvic lymphadenopathy. Reproductive: Prostatomegaly. Other: Additional small to moderate mesenteric/ pelvic ascites. Musculoskeletal: Degenerative changes of the visualized thoracolumbar spine. Status post L2-5 PLIF. IMPRESSION: Fluid/gas collection arising from the right lower abdomen near the cecum, extending into the anterior mid abdomen, and superiorly along the right hepatic dome. Suspected extravasation of contrast into the collection, suggesting bowel perforation. This has progressed from the recent prior. Postsurgical changes related to prior bowel resection in the left lower abdomen. No evidence of bowel obstruction. Additional small to moderate mesenteric/ pelvic ascites. Small right pleural effusion. Associated right lower lobe opacity, likely atelectasis. Additional ancillary findings as above. These results were called by telephone at the time of interpretation on 02/06/2017 at 4:10 pm to Dr. Aviva Signs , who verbally acknowledged these results. Electronically  Signed   By: Julian Hy M.D.   On: 02/06/2017 16:13   Dg Abd 1 View  Result Date: 02/06/2017 CLINICAL DATA:  NG tube placement. EXAM: ABDOMEN - 1 VIEW COMPARISON:  CT abdomen and pelvis this same day. Single-view of the abdomen earlier today. FINDINGS: NG tube is in place with the tip in the mid to distal body of the stomach. Gaseous distention of bowel is noted. Midline surgical staples are noted. IMPRESSION: NG tube in good position. Electronically Signed   By: Inge Rise M.D.   On: 02/06/2017 20:09   Dg Abd 1 View  Result Date: 02/06/2017 CLINICAL DATA:  NG tube placement. EXAM: ABDOMEN - 1 VIEW COMPARISON:  02/04/2017 FINDINGS: NGT tip is in the proximal duodenum. Air in multiple large and small bowel loops with diminished distention of small bowel since the prior study. Contrast remains in the ascending colon. Multiple gallstones. IMPRESSION: NG tube tip in the duodenum.  Improved distention of small bowel. Electronically Signed   By: Lorriane Shire M.D.   On: 02/06/2017 10:06    Anti-infectives: Anti-infectives    Start     Dose/Rate Route Frequency Ordered Stop   02/06/17 1700  piperacillin-tazobactam (ZOSYN) IVPB 3.375 g     3.375 g 12.5 mL/hr over 240 Minutes Intravenous Every 8 hours 02/06/17 1629     02/02/17 0900  cefTRIAXone (ROCEPHIN) 2 g in dextrose 5 % 50 mL IVPB     2 g 100 mL/hr over 30 Minutes Intravenous  Once 02/09/2017 1429 02/02/17 0930   02/04/2017 1730  metroNIDAZOLE (FLAGYL) IVPB 500 mg     500 mg 100 mL/hr over 60 Minutes Intravenous Every 8 hours 02/07/2017 1429 02/02/17 0325   02/23/2017 0822  cefTRIAXone (ROCEPHIN) 2 g in dextrose 5 % 50 mL IVPB     2 g 100 mL/hr over 30 Minutes Intravenous On call to O.R. 02/13/2017 7673 02/02/2017 0920   02/07/2017 4193  metroNIDAZOLE (FLAGYL) IVPB 500 mg  Status:  Discontinued     500 mg 100 mL/hr over 60 Minutes Intravenous Every 8 hours 02/03/2017 0822 02/25/2017 1330      Assessment/Plan: s/p Procedure(s): EXPLORATORY  LAPAROTOMY LYSIS OF ADHESION Impression: Postoperative day 6. Patient to undergo interventional drainage of intra-abdominal fluid collections today.  LOS: 15 days    Aviva Signs 02/08/2017

## 2017-02-08 NOTE — Sedation Documentation (Signed)
Care turned over to [facility] service, all lines and tubes intact, iv solutions and rates unchanged. Son with patient.

## 2017-02-08 NOTE — Progress Notes (Signed)
PROGRESS NOTE    Joshua Downs  GUY:[id number] DOB: 28-Oct-1931 DOA: 12/30/2016 PCP: Deloria Lair, MD    Brief Narrative: 83 year old man initially admitted to the hospital on 3/28 due to abdominal pain and found to have a small bowel obstruction. CT scan of the abdomen showed a high-grade small bowel obstruction with transition point in the mid abdomen. Was also noted to have acute kidney injury. NG tube was placed in the emergency department, surgical services were consulted. NG tube was initially removed on 3/30 and was reinserted on the same day due to recurrence of distention. NG tube was again removed on 4/3 due to patient's symptoms improving, unfortunately he has failed again with increased vomiting and NG tube has been replaced on 4/4. OR on 4/5 for ex lap with lysis of adhesions. Very slow to progress post op. Still with abd pain and distention. CT today shows extravasatioin of contrast concerning for bowel perforation. He was deferred for IR placement of drainage and planned for it today.  He is confused last night, and is lethargic today.  He is on TPN>   Assessment & Plan:   Principal Problem:   Small bowel obstruction Active Problems:   Acute renal failure (ARF) (HCC)   Hyperglycemia   Protein-calorie malnutrition, severe   Malnutrition of moderate degree  Small bowel obstruction -He has failed conservative management and went to OR on 4/5 for ex lap and lysis of adhesions. -Follow surgical recommendations: keep NG in today.  He is still having significant abdominal distention and hypertympani.  -Continue TPN. -CT scan 4/9with large amount of free air but no leakage of contrast. D/W Dr. Arnoldo Morale plan to redo CT and after that recommendations will follow. -Repeat CT 4/10 with extraluminal fluid/gas with extravasation of contrast concerning for bowel perforation. Dr. Arnoldo Morale aware. IR plan to place drainage tube today.  Continue with Zosyn.  No fever, or leukocytosys.  -Still has  not had return of bowel function.  Severe protein caloric malnutrition -Continue TPN. -Appreciate dietitian and pharmacy recommendations.  Hypernatremia -Resolved.  Hypertension -By mouth meds on hold given nothing by mouth state. -BP remains low normal.  Acute on CKD Stage III -Baseline Cr aroung 1.1-1.2. -Cr maxed at2.12. -down to 1.75 on 4/10. Now at 1.3  Wide Complex Tachycardia -Discussed via phone with cards, Dr. Meda Coffee and also with Dr. Bronson Ing. -Appears to be simply sinus tach with a wide QRS due to his RBBB. -After fluid bolus, HR has returned to 80s-90s.  DVT prophylaxis:Lovenox Code Status:Full code Family Communication:Sons at bedside updated on plan of care and all questions answered on 4/9. Disposition Plan:To be determined pending medical stability.  Consultants:   Surgery.   Procedures:  Exp Lap with lysis of adhesion.    Antimicrobials: Anti-infectives    Start     Dose/Rate Route Frequency Ordered Stop   02/06/17 1700  piperacillin-tazobactam (ZOSYN) IVPB 3.375 g     3.375 g 12.5 mL/hr over 240 Minutes Intravenous Every 8 hours 02/06/17 1629     02/02/17 0900  cefTRIAXone (ROCEPHIN) 2 g in dextrose 5 % 50 mL IVPB     2 g 100 mL/hr over 30 Minutes Intravenous  Once 01/29/2017 1429 02/02/17 0930   02/26/2017 1730  metroNIDAZOLE (FLAGYL) IVPB 500 mg     500 mg 100 mL/hr over 60 Minutes Intravenous Every 8 hours 01/30/2017 1429 02/02/17 0325   02/13/2017 0822  cefTRIAXone (ROCEPHIN) 2 g in dextrose 5 % 50 mL IVPB  2 g 100 mL/hr over 30 Minutes Intravenous On call to O.R. 02/07/2017 1245 02/13/2017 0920   02/21/2017 0822  metroNIDAZOLE (FLAGYL) IVPB 500 mg  Status:  Discontinued     500 mg 100 mL/hr over 60 Minutes Intravenous Every 8 hours 02/11/2017 0822 02/11/2017 1330       Subjective:   No complaints.   Appears lethargic.    Objective: Vitals:   02/07/17 1441 02/07/17 1942 02/07/17 2040 02/08/17 0700  BP: 100/66  106/68 120/70    Pulse: (!) 110  (!) 108 100  Resp: (!) 24   20  Temp: 97.7 F (36.5 C)  98.2 F (36.8 C) 97.8 F (36.6 C)  TempSrc: Oral  Oral Oral  SpO2: 99% 97% 100% 100%  Weight:      Height:        Intake/Output Summary (Last 24 hours) at 02/08/17 1158 Last data filed at 02/08/17 0918  Gross per 24 hour  Intake          2163.66 ml  Output             3225 ml  Net         -1061.34 ml   Filed Weights   01/23/2017 1204 01/09/2017 1807  Weight: 76.2 kg (168 lb) 73.2 kg (161 lb 6.4 oz)    Examination:  General exam: Appears calm and comfortable  Respiratory system: Clear to auscultation. Respiratory effort normal. Cardiovascular system: S1 & S2 heard, RRR. No JVD, murmurs, rubs, gallops or clicks. No pedal edema. Gastrointestinal system: Abdomen is distended with hypertypani.  No organomegaly or masses felt. Normal bowel sounds heard. Central nervous system: Alert and oriented. No focal neurological deficits. Extremities: Symmetric 5 x 5 power.  Back with no evidence of infection at surgical site.  Skin: No rashes, lesions or ulcers Psychiatry: Judgement and insight appear normal. Mood & affect appropriate.   Data Reviewed: I have personally reviewed following labs and imaging studies  CBC:  Recent Labs Lab 02/03/17 0636 02/05/17 0414 02/06/17 0414 02/07/17 0410  WBC 9.3 6.5 7.4 7.3  NEUTROABS  --  5.3  --  6.1  HGB 13.4 12.9* 12.9* 11.5*  HCT 40.5 38.5* 38.9* 33.9*  MCV 86.0 85.6 85.9 85.2  PLT 209 253 218 809   Basic Metabolic Panel:  Recent Labs Lab 02/02/17 0518  02/04/17 1134 02/05/17 0414 02/06/17 0414 02/07/17 0410 02/08/17 0530  NA 132*  < > 136 135 136 139 143  K 4.7  < > 4.3 4.2 3.7 3.4* 3.2*  CL 92*  < > 93* 97* 95* 99* 101  CO2 32  < > 33* 28 32 33* 36*  GLUCOSE 170*  < > 155* 131* 156* 165* 165*  BUN 43*  < > 48* 63* 69* 72* 64*  CREATININE 2.00*  < > 1.81* 2.12* 1.75* 1.49* 1.33*  CALCIUM 7.8*  < > 8.6* 7.9* 7.6* 7.5* 7.6*  MG 1.6*  --   --  1.9 1.9   --  2.1  PHOS 4.8*  --   --  4.0 4.6  --  4.7*  < > = values in this interval not displayed. GFR: Estimated Creatinine Clearance: 42.8 mL/min (A) (by C-G formula based on SCr of 1.33 mg/dL (H)). Liver Function Tests:  Recent Labs Lab 02/02/17 0518 02/05/17 0414 02/07/17 0410 02/08/17 0530  AST 20 20 26  35  ALT 33 15* 20 24  ALKPHOS 52 31* 49 88  BILITOT 0.7 0.5 0.8 1.6*  PROT 5.5*  5.2* 4.7* 5.0*  ALBUMIN 2.2* 1.7* 1.4* 1.4*   CBG:  Recent Labs Lab 02/07/17 0557 02/07/17 1149 02/07/17 1758 02/07/17 2231 02/08/17 0700  GLUCAP 189* 145* 123* 155* 149*   Recent Results (from the past 240 hour(s))  Surgical pcr screen     Status: Abnormal   Collection Time: 01/31/17 11:46 PM  Result Value Ref Range Status   MRSA, PCR NEGATIVE NEGATIVE Final   Staphylococcus aureus POSITIVE (A) NEGATIVE Final    Comment:        The Xpert SA Assay (FDA approved for NASAL specimens in patients over 2 years of age), is one component of a comprehensive surveillance program.  Test performance has been validated by [facility] for patients greater than or equal to 56 year old. It is not intended to diagnose infection nor to guide or monitor treatment. RESULT CALLED TO, READ BACK BY AND VERIFIED WITH:  TETREAULT,H @ 4128 ON 02/04/2017 BY JUW      Radiology Studies: Ct Abdomen Pelvis Wo Contrast  Result Date: 02/06/2017 CLINICAL DATA:  Follow-up surgical pneumoperitoneum EXAM: CT ABDOMEN AND PELVIS WITHOUT CONTRAST TECHNIQUE: Multidetector CT imaging of the abdomen and pelvis was performed following the standard protocol without IV contrast. COMPARISON:  02/05/2017 FINDINGS: Lower chest: Lung bases are clear. Small right pleural effusion, mildly increased. Associated right lower lobe opacity, likely compressive atelectasis. Hepatobiliary: Unenhanced liver is grossly unremarkable. Cholelithiasis, without associated inflammatory changes. Pancreas: Within normal limits. Spleen: Within normal  limits. Adrenals/Urinary Tract: Adrenal glands are within normal limits. Small right renal cysts measuring up to 2.0 cm. Left kidney is unremarkable. No renal, ureteral, or bladder calculi. No hydronephrosis. Bladder is grossly unremarkable. Stomach/Bowel: Stomach is grossly unremarkable. No evidence of bowel obstruction. Suture line from small bowel resection in the left lower abdomen (series 2/image 56). Sigmoid diverticulosis, without evidence of diverticulitis. Fluid/gas collection arising from the right lower abdomen near the cecum (series 2/ image 48), measuring approximately 9.1 x 27.4 cm in greatest axial dimension of the level of the mid abdomen (series 2/ image 39), and extending superiorly with mottled gas/debris along the right hepatic dome (series 2/image 7). This has mildly increased from the recent prior and is worrisome for perforation with extravasation of hyperdense fluid/ contrast (series 2/ image 42). Vascular/Lymphatic: No evidence of abdominal aortic aneurysm. Atherosclerotic calcifications of the abdominal aorta and branch vessels. No suspicious abdominopelvic lymphadenopathy. Reproductive: Prostatomegaly. Other: Additional small to moderate mesenteric/ pelvic ascites. Musculoskeletal: Degenerative changes of the visualized thoracolumbar spine. Status post L2-5 PLIF. IMPRESSION: Fluid/gas collection arising from the right lower abdomen near the cecum, extending into the anterior mid abdomen, and superiorly along the right hepatic dome. Suspected extravasation of contrast into the collection, suggesting bowel perforation. This has progressed from the recent prior. Postsurgical changes related to prior bowel resection in the left lower abdomen. No evidence of bowel obstruction. Additional small to moderate mesenteric/ pelvic ascites. Small right pleural effusion. Associated right lower lobe opacity, likely atelectasis. Additional ancillary findings as above. These results were called by  telephone at the time of interpretation on 02/06/2017 at 4:10 pm to Dr. Aviva Signs , who verbally acknowledged these results. Electronically Signed   By: Julian Hy M.D.   On: 02/06/2017 16:13   Dg Abd 1 View  Result Date: 02/06/2017 CLINICAL DATA:  NG tube placement. EXAM: ABDOMEN - 1 VIEW COMPARISON:  CT abdomen and pelvis this same day. Single-view of the abdomen earlier today. FINDINGS: NG tube is in place with the  tip in the mid to distal body of the stomach. Gaseous distention of bowel is noted. Midline surgical staples are noted. IMPRESSION: NG tube in good position. Electronically Signed   By: Inge Rise M.D.   On: 02/06/2017 20:09    Scheduled Meds: . diclofenac sodium  2 g Topical Daily  . insulin aspart  0-15 Units Subcutaneous Q8H  . LORazepam  0.5 mg Intravenous QHS  . mupirocin ointment   Nasal BID  . piperacillin-tazobactam (ZOSYN)  IV  3.375 g Intravenous Q8H  . sodium chloride  500 mL Intravenous Once   Continuous Infusions: . Marland KitchenTPN (CLINIMIX-E) Adult 80 mL/hr (02/07/17 1800)  . sodium chloride 125 mL/hr at 02/07/17 0839     LOS: 15 days   Janus Vlcek, MD [facility].   If 7PM-7AM, please contact night-coverage [email or web address] Password [facility] 02/08/2017, 11:58 AM

## 2017-02-08 NOTE — Progress Notes (Signed)
PHARMACY - ADULT TOTAL PARENTERAL NUTRITION CONSULT NOTE   Pharmacy Consult for TPN Indication: Bowel obstruction, surgery 4/5  Patient Measurements: Height: 6' (182.9 cm) Weight: 161 lb 6.4 oz (73.2 kg) IBW/kg (Calculated) : 77.6 TPN AdjBW (KG): 73.2 Body mass index is 21.89 kg/m.  Assessment: 83 year old male with history of hypertension, remote left kidney carcinoma status post partial nephrectomy, chronic bradycardia, recent back surgery who presented with abdominal pain for past 3 days. Denied any fevers, chills, nausea or vomiting. Denied dysuria or diarrhea.  Pt had surgery on 02/22/2017 for SBO. Patient has still not had return of bowel function. NG tube was replacedCT scan of abdomen + for bowel perforation.   IR plan to place drainage tube today.   Glucose control improved with BS 132-165.  SCr improved.    GI: Pt has gone approximately 10 days with minimal to no nutrition. At this time, TPN is warranted if patient shows intolerance to clear liquids.    Insulin requirements in the past 24 hours: none BMP Latest Ref Rng & Units 02/08/2017 02/07/2017 02/06/2017  Glucose 65 - 99 mg/dL 165(H) 165(H) 156(H)  BUN 6 - 20 mg/dL 64(H) 72(H) 69(H)  Creatinine 0.61 - 1.24 mg/dL 1.33(H) 1.49(H) 1.75(H)  Sodium 135 - 145 mmol/L 143 139 136  Potassium 3.5 - 5.1 mmol/L 3.2(L) 3.4(L) 3.7  Chloride 101 - 111 mmol/L 101 99(L) 95(L)  CO2 22 - 32 mmol/L 36(H) 33(H) 32  Calcium 8.9 - 10.3 mg/dL 7.6(L) 7.5(L) 7.6(L)   TPN Access: central IV access TPN start date: 01/31/17 Current Nutrition: none  Nutritional Goals (per RD recommendation on 01/30/2017): Estimated Nutritional Needs:  Kcal:  2050-2250 (28-31 kcal/kg bw) Protein:  88-102 g Pro (1.2-1.4 g/kg bw) Fluid:  >1.8 L (25 ml/kg bw)  Plan:  TPN rate at goal  Continue Clinimix E 5/15 at 75ml/hr  20% lipid emulsion at 67ml/hr (infuse over 12 hours)  Maintenance IV fluids per MD  TPN provides 96 g of protein and total calories: 1843 kCals per  day   K 3.2--> will give KCL 77meq x3 IV runs  MVI and trace elements in TPN  Regular Insulin 15 units/bag  Change SSI q8h CBG checks  Monitor TPN labs, renal fxn, fluid balance, glucose tolerance  Isac Sarna, BS [place], BCPS Clinical Pharmacist Pager [phone]  02/08/2017,12:19 PM

## 2017-02-08 NOTE — Procedures (Signed)
CT guided transgluteal abscess drain catheter placement CT guided perihepatic abscess drain catheter placement  Aspirates from both site sent separately for GS, C&S  No complication No blood loss. See complete dictation in [facility].

## 2017-02-08 NOTE — Consult Note (Signed)
Chief Complaint: Patient was seen in consultation today for intra abdominal abscess drain(s) at the request of Dr Mickeal Needy  Referring Physician(s): Dr Mickeal Needy  Supervising Physician: Arne Cleveland  Patient Status: [facility]- IP  History of Present Illness: Joshua Downs is a 83 y.o. male   Admitted to [facility] through ED 3/28 N/V; Abd pain Found to have small bowel obstruction Ex Lap surgery with adhesion lysis 02/13/2017 abd pain worsening with distension CT 4/10: IMPRESSION: Fluid/gas collection arising from the right lower abdomen near the cecum, extending into the anterior mid abdomen, and superiorly along the right hepatic dome. Suspected extravasation of contrast into the collection, suggesting bowel perforation. This has progressed from the recent prior. Postsurgical changes related to prior bowel resection in the left lower abdomen. No evidence of bowel obstruction. Additional small to moderate mesenteric/ pelvic ascites. Small right pleural effusion. Associated right lower lobe opacity, likely atelectasis  Request for intr abdominal abscess drain placement Imaging reviewed by DrWatts and he approves procedure Pt has been transported to [facility] for same  Past Medical History:  Diagnosis Date  . Arthritis   . Bradycardia    says normal HR runs about 40-44  . Cancer [facility]) 2004   left kidney  . Chronic back pain    DDD/stenosis  . Constipation    takes Dulcolax daily as needed as well as Colace  . Dysrhythmia    right BBB  . Enlarged prostate   . History of colon polyps   . History of kidney stones   . Hypertension    takes Lotensin daily  . Insomnia    takes Melatonin nightly as well as Benadryl  . Joint swelling   . Macular degeneration    takes Ocuvite and Fish Oil;dry  . Nocturia   . Numbness    in hands and feet  . Weakness    and heaviness;notices more in left but some in right    Past Surgical History:  Procedure Laterality Date  . BACK  SURGERY  2015  . BACK SURGERY  10/2012  . CARPAL TUNNEL RELEASE Right 02/18/2015   Procedure: RIGHT WRIST CARPAL TUNNEL RELEASE;  Surgeon: Daryll Brod, MD;  Location: [place];  Service: Orthopedics;  Laterality: Right;  . CARPAL TUNNEL RELEASE Left 04/13/2015   Procedure: LEFT CARPAL TUNNEL RELEASE;  Surgeon: Daryll Brod, MD;  Location: [place];  Service: Orthopedics;  Laterality: Left;  . COLONOSCOPY    . ESOPHAGOGASTRODUODENOSCOPY N/A 07/06/2016   Procedure: ESOPHAGOGASTRODUODENOSCOPY (EGD);  Surgeon: Rogene Houston, MD;  [facility];  Service: Endoscopy;  Laterality: N/A;  3:05  . HERNIA REPAIR    . LAPAROTOMY N/A 02/24/2017   Procedure: EXPLORATORY LAPAROTOMY;  Surgeon: Vickie Epley, MD;  Location: [facility];  Service: General;  Laterality: N/A;  . left kidney portion removed  2004  . Left partial nephrectomy     2004, kidney cancer  . LUMBAR LAMINECTOMY/DECOMPRESSION MICRODISCECTOMY Left 01/04/2017   Procedure: LUMBAR ONE - LUMBAR TWO  LAMINOTOMY AND MICRODISCECTOMY;  Surgeon: Jovita Gamma, MD;  Location: [place];  Service: Neurosurgery;  Laterality: Left;  . LYSIS OF ADHESION N/A 02/25/2017   Procedure: LYSIS OF ADHESION;  Surgeon: Vickie Epley, MD;  Location: [facility];  Service: General;  Laterality: N/A;  . PARATHYROIDECTOMY    . spot taken off of back  2008  . ULNAR NERVE TRANSPOSITION Right 02/18/2015   Procedure: DECOMPRESSION ULNAR NERVE RIGHT ELBOW;  Surgeon: Daryll Brod, MD;  Location: [place];  Service: Orthopedics;  Laterality: Right;    Allergies: No known allergies  Medications: Prior to Admission medications   Medication Sig Start Date End Date Taking? Authorizing Provider  benazepril-hydrochlorthiazide (LOTENSIN HCT) 20-12.5 MG per tablet Take 1 tablet by mouth daily.    Historical Provider, MD  beta carotene w/minerals (OCUVITE) tablet Take 1 tablet by mouth daily.    Historical Provider, MD  diclofenac  sodium (VOLTAREN) 1 % GEL Apply 1 application topically daily. Left shoulder    Historical Provider, MD  diphenhydrAMINE (BENADRYL) 25 MG tablet Take 50 mg by mouth at bedtime.     Historical Provider, MD  HYDROcodone-acetaminophen (NORCO/VICODIN) 5-325 MG tablet Take 1-2 tablets by mouth every 4 (four) hours as needed for moderate pain. 01/05/17   Jovita Gamma, MD  Melatonin 10 MG TABS Take 10 mg by mouth at bedtime.    Historical Provider, MD  Omega-3 Fatty Acids (FISH OIL) 1200 MG CAPS Take 2,400 mg by mouth 2 (two) times daily.     Historical Provider, MD     Family History  Problem Relation Age of Onset  . Dementia Mother   . Cancer Father   . Hypertension Sister   . Multiple myeloma Son     Social History   Social History  . Marital status: Widowed    Spouse name: N/A  . Number of children: N/A  . Years of education: N/A   Occupational History  . retired    Social History Main Topics  . Smoking status: Former Smoker    Years: 20.00    Types: Cigars, Pipe  . Smokeless tobacco: Current User    Types: Chew     Comment: quit smoking in 1985  . Alcohol use No  . Drug use: No  . Sexual activity: No   Other Topics Concern  . None   Social History Narrative  . None    Review of Systems: A 12 point ROS discussed and pertinent positives are indicated in the HPI above.  All other systems are negative.  Review of Systems  Constitutional: Positive for activity change, appetite change and fatigue. Negative for fever.  Respiratory: Positive for shortness of breath.   Cardiovascular: Negative for chest pain.  Gastrointestinal: Positive for abdominal distention, abdominal pain and nausea. Negative for vomiting.  Neurological: Positive for weakness.  Psychiatric/Behavioral: Negative for behavioral problems and confusion.    Vital Signs: There were no vitals taken for this visit.  Physical Exam  Constitutional: He is oriented to person, place, and time.    Cardiovascular: Normal rate and regular rhythm.   Pulmonary/Chest: Effort normal.  Abdominal: Soft. Bowel sounds are normal. He exhibits distension. There is no tenderness.  Musculoskeletal: Normal range of motion.  Moves all 4s to command  Neurological: He is alert and oriented to person, place, and time.  Answers all questions appropriately  Skin: Skin is warm and dry.  Psychiatric: He has a normal mood and affect.  Pt extremely weak Hand mitts on ---he tries to pull at NG Consented with Son Richardson Landry at bedside  Nursing note and vitals reviewed.   Mallampati Score:  MD Evaluation Airway: WNL Heart: WNL Abdomen: WNL Chest/ Lungs: WNL ASA  Classification: 3 Mallampati/Airway Score: Two  Imaging: Ct Abdomen Pelvis Wo Contrast  Result Date: 02/06/2017 CLINICAL DATA:  Follow-up surgical pneumoperitoneum EXAM: CT ABDOMEN AND PELVIS WITHOUT CONTRAST TECHNIQUE: Multidetector CT imaging of the abdomen and pelvis was performed  following the standard protocol without IV contrast. COMPARISON:  02/05/2017 FINDINGS: Lower chest: Lung bases are clear. Small right pleural effusion, mildly increased. Associated right lower lobe opacity, likely compressive atelectasis. Hepatobiliary: Unenhanced liver is grossly unremarkable. Cholelithiasis, without associated inflammatory changes. Pancreas: Within normal limits. Spleen: Within normal limits. Adrenals/Urinary Tract: Adrenal glands are within normal limits. Small right renal cysts measuring up to 2.0 cm. Left kidney is unremarkable. No renal, ureteral, or bladder calculi. No hydronephrosis. Bladder is grossly unremarkable. Stomach/Bowel: Stomach is grossly unremarkable. No evidence of bowel obstruction. Suture line from small bowel resection in the left lower abdomen (series 2/image 56). Sigmoid diverticulosis, without evidence of diverticulitis. Fluid/gas collection arising from the right lower abdomen near the cecum (series 2/ image 48), measuring  approximately 9.1 x 27.4 cm in greatest axial dimension of the level of the mid abdomen (series 2/ image 39), and extending superiorly with mottled gas/debris along the right hepatic dome (series 2/image 7). This has mildly increased from the recent prior and is worrisome for perforation with extravasation of hyperdense fluid/ contrast (series 2/ image 42). Vascular/Lymphatic: No evidence of abdominal aortic aneurysm. Atherosclerotic calcifications of the abdominal aorta and branch vessels. No suspicious abdominopelvic lymphadenopathy. Reproductive: Prostatomegaly. Other: Additional small to moderate mesenteric/ pelvic ascites. Musculoskeletal: Degenerative changes of the visualized thoracolumbar spine. Status post L2-5 PLIF. IMPRESSION: Fluid/gas collection arising from the right lower abdomen near the cecum, extending into the anterior mid abdomen, and superiorly along the right hepatic dome. Suspected extravasation of contrast into the collection, suggesting bowel perforation. This has progressed from the recent prior. Postsurgical changes related to prior bowel resection in the left lower abdomen. No evidence of bowel obstruction. Additional small to moderate mesenteric/ pelvic ascites. Small right pleural effusion. Associated right lower lobe opacity, likely atelectasis. Additional ancillary findings as above. These results were called by telephone at the time of interpretation on 02/06/2017 at 4:10 pm to Dr. Aviva Signs , who verbally acknowledged these results. Electronically Signed   By: Julian Hy M.D.   On: 02/06/2017 16:13   Ct Abdomen Pelvis Wo Contrast  Result Date: 02/05/2017 CLINICAL DATA:  Small bowel obstruction. Status post laparotomy and lysis of adhesions on 02/03/2017. EXAM: CT ABDOMEN AND PELVIS WITHOUT CONTRAST TECHNIQUE: Multidetector CT imaging of the abdomen and pelvis was performed following the standard protocol without IV contrast. COMPARISON:  01/31/2017 FINDINGS: Lower chest:  New small right pleural effusion with overlying dependent atelectasis in the lower lobe. Milder dependent atelectasis in the left lower lobe. Coronary artery atherosclerosis. Venous catheter tip in the lower SVC. Hepatobiliary: No focal liver abnormality is seen on this unenhanced study. Unchanged gallstones. No biliary dilatation. Pancreas: Unremarkable. Spleen: Unremarkable. Adrenals/Urinary Tract: Unremarkable adrenal glands. No hydronephrosis. Unchanged right renal cysts measuring up to 2.1 cm in size. Unremarkable bladder. Stomach/Bowel: Enteric tube terminates in the gastric body. Small bowel dilatation has greatly decreased from the prior CT. Oral contrast is present in multiple loops of proximal and mid small bowel which remain mildly dilated up to approximately 4 cm in diameter. There is a transition to decompressed distal small bowel in the left mid to lower abdomen at the site of new suture material (series 2, image 68). A small amount of gas is present in the transverse colon. The colon is otherwise decompressed. There is sigmoid colon diverticulosis without definite evidence of acute diverticulitis. No extraluminal contrast material is identified. Vascular/Lymphatic: Abdominal aortic atherosclerosis without aneurysm. No enlarged lymph nodes. Reproductive: Mild prostatic enlargement. Other: Postsurgical changes from  interval exploratory laparotomy with skin staples in the anterior abdominal wall. Large volume intraperitoneal free air with moderate volume intraperitoneal free fluid. Two large gas and fluid collections in the upper abdomen anterior to the colon measuring 12-15 cm each. Diffuse subcutaneous fat stranding, mildly asymmetric in the right flank region. Musculoskeletal: Left iliopsoas bursal fluid and calcification as previously seen. Prior L2-L5 fusion. Advanced disc degeneration at L1-2. IMPRESSION: 1. Postoperative changes from interval laparotomy. Large amount of intraperitoneal free air  and moderate volume free fluid. Large gas and fluid collections in the upper abdomen. 2. Mild small bowel dilatation, improved from the prior study though with transition point in the left mid to lower abdomen concerning for residual/recurrent obstruction. 3. New small right pleural effusion.  Bibasilar atelectasis. 4. Aortic atherosclerosis. Electronically Signed   By: Logan Bores M.D.   On: 02/05/2017 15:43   Ct Abdomen Pelvis Wo Contrast  Result Date: 01/14/2017 CLINICAL DATA:  83 year old male with coffee ground emesis and abdominal pain. Nausea vomiting for 3 days. EXAM: CT ABDOMEN AND PELVIS WITHOUT CONTRAST TECHNIQUE: Multidetector CT imaging of the abdomen and pelvis was performed following the standard protocol without IV contrast. COMPARISON:  Abdomen MRI 12/21/2016. CT Abdomen and Pelvis 04/17/2005 FINDINGS: Lower chest: Negative.  No pericardial or pleural effusion. There is mild oral contrast distention of the visible distal esophagus. See gastrointestinal findings below. Hepatobiliary: Chronic cholelithiasis. Calcified up to 18 mm gallstones. The gallbladder is contracted. Negative noncontrast liver. Pancreas: Negative. Spleen: Negative. Adrenals/Urinary Tract: Negative adrenal glands. Chronic right renal midpole benign-appearing cysts have mildly enlarged since 2006. No hydronephrosis. Unremarkable urinary bladder. No hydroureter. Stomach/Bowel: Moderate to severe gaseous and contrast distension of the stomach. The duodenum is dilated. Proximal small bowel loops are moderately to severely dilated up to 5 cm diameter, and there is a high-grade abrupt transition point in the mid small bowel as seen on series 2, image 58 and coronal image 35. An obstructing etiology is unclear, but perhaps related to internal hernia. See sagittal images. Despite this severe dilatation of bowel there is no free air or free fluid. Distal to the transition point the small bowel loops are all decompressed to the  terminal ileum. Negative appendix. Decompressed large bowel. Sigmoid diverticulosis. Vascular/Lymphatic: Vascular patency is not evaluated in the absence of IV contrast. Aortoiliac calcified atherosclerosis. No lymphadenopathy. Reproductive: Negative. Other: No pelvic free fluid. Musculoskeletal: Postoperative changes to the lumbar spine. Osteopenia. Degenerative changes about the left hip including probable multiple intramuscular synovial cysts, affecting the distal iliopsoas muscle. No acute osseous abnormality identified. IMPRESSION: 1. Severe, high-grade small bowel obstruction. Abrupt transition point in the mid abdomen as seen on coronal image 35. Obstructing etiology uncertain but perhaps related to internal hernia. 2. No free air or free fluid. 3.  Calcified aortic atherosclerosis. 4. Chronic cholelithiasis. Electronically Signed   By: Genevie Ann M.D.   On: 01/25/2017 15:11   Dg Chest 1 View  Result Date: 02/06/2017 CLINICAL DATA:  History of renal malignancy, recent lysis of adhesions EXAM: CHEST 1 VIEW COMPARISON:  Portable chest x-ray of February 05, 2017 FINDINGS: There remains gas-filled bowel under the right hemidiaphragm. There are other loops of gas-filled bowel noted under the left hemidiaphragm. The cardiac silhouette is mildly enlarged. The pulmonary vascularity is prominent centrally. The interstitial markings are mildly prominent though stable. The elevated right hemidiaphragm decreases the excursion of the right lung. There is calcification in the wall of the aortic arch. The nasogastric tube tip lies below the inferior  margin of the image. There is a portion of the proximal port seen in the region of the pylorus. IMPRESSION: Hypoinflation of the right lung due to elevation of the hemidiaphragm secondary to presumed gas-filled bowel under this diaphragm. Free intra abdominal air from the laparotomy 5 days ago would likely be reabsorbed by now. Stable cardiomegaly. Thoracic aortic atherosclerosis.  An acute abdominal series is recommended to further evaluate the subdiaphragmatic structures and assure that no significant free extraluminal gas is in the present. This would also allow assessment of the positioning of the tip of the nasogastric tube. Electronically Signed   By: David  Martinique M.D.   On: 02/06/2017 07:31   Dg Abd 1 View  Result Date: 02/06/2017 CLINICAL DATA:  NG tube placement. EXAM: ABDOMEN - 1 VIEW COMPARISON:  CT abdomen and pelvis this same day. Single-view of the abdomen earlier today. FINDINGS: NG tube is in place with the tip in the mid to distal body of the stomach. Gaseous distention of bowel is noted. Midline surgical staples are noted. IMPRESSION: NG tube in good position. Electronically Signed   By: Inge Rise M.D.   On: 02/06/2017 20:09   Dg Abd 1 View  Result Date: 02/06/2017 CLINICAL DATA:  NG tube placement. EXAM: ABDOMEN - 1 VIEW COMPARISON:  02/04/2017 FINDINGS: NGT tip is in the proximal duodenum. Air in multiple large and small bowel loops with diminished distention of small bowel since the prior study. Contrast remains in the ascending colon. Multiple gallstones. IMPRESSION: NG tube tip in the duodenum.  Improved distention of small bowel. Electronically Signed   By: Lorriane Shire M.D.   On: 02/06/2017 10:06   Dg Abd 1 View  Result Date: 02/04/2017 CLINICAL DATA:  Abdominal pain, vomiting, and distention. Small bowel obstruction. EXAM: ABDOMEN - 1 VIEW COMPARISON:  01/31/2017 FINDINGS: Nasogastric tube is again seen within the proximal stomach. Multiple dilated small bowel loops show improvement since previous study, however there remains a paucity of colonic gas. Skin staples are seen in the lower abdomen. Multiple calcified gallstones again seen in the right upper quadrant. Lumbar spine fusion hardware again seen. IMPRESSION: Dilated small bowel loops show mild improvement, but remains suspicious for small bowel obstruction. Nasogastric tube in proximal  stomach. Cholelithiasis. Electronically Signed   By: Earle Gell M.D.   On: 02/04/2017 19:56   Ct Abdomen Pelvis W Contrast  Result Date: 01/31/2017 CLINICAL DATA:  Left renal carcinoma 2004 with partial nephrectomy. Severe small-bowel obstruction, nausea and vomiting. EXAM: CT ABDOMEN AND PELVIS WITH CONTRAST TECHNIQUE: Multidetector CT imaging of the abdomen and pelvis was performed using the standard protocol following bolus administration of intravenous contrast. CONTRAST:  135m ISOVUE-300 IOPAMIDOL (ISOVUE-300) INJECTION 61% COMPARISON:  01/12/2017 FINDINGS: Lower chest: Top normal size cardiac chambers. No pericardial effusion. Left basilar atelectasis with minimal bronchiectasis. Hepatobiliary: Homogeneous attenuation of the liver without space-occupying mass. Nondistended gallbladder with gallstones. Pancreas: Unremarkable. No pancreatic ductal dilatation or surrounding inflammatory changes. Spleen: Normal in size without focal abnormality. Adrenals/Urinary Tract: Stable right-sided renal cysts. Normal appearing adrenal glands. No obstructive uropathy. Unremarkable urinary bladder. No solid enhancing renal mass in either kidney. Stomach/Bowel: Gastric tube is seen within fluid-filled distended stomach, coiled back upon itself within the gastric antrum. Markedly dilated air and fluid-filled small bowel loops measuring up to 5.6 cm are again identified a transition point in the mid pelvis similar to that seen previously possibly related to an adhesion. Mid to distal ileal loops superior contracted. Large bowel is nondistended. There  is no free air. Vascular/Lymphatic: Aortoiliac atherosclerosis. Opacification of the celiac axis, SMA and IMA. Reproductive: Mildly enlarged prostate up to 5.8 cm in maximum dimension. Other: No free fluid. Musculoskeletal: Left iliopsoas bursal fluid containing ill-defined a amorphous calcifications possibly representing changes of osteochondral bodies from the left hip.  Bilateral axial joint space narrowing of the hips. Postop change of the lumbar spine. IMPRESSION: 1. Relatively unchanged appearance of high-grade small bowel obstruction to the level of the mid ileum. Transition zone is less well-defined on current exam but noted on prior study to be within the mid upper pelvis. Gastric tube is seen within distended stomach slightly coiled back upon itself within the antrum. 2. Stable right-sided renal cysts. 3. Left iliopsoas bursal fluid with calcifications possibly representing osteochondral bodies. 4. Aortoiliac atherosclerosis. Electronically Signed   By: Ashley Royalty M.D.   On: 01/31/2017 22:22   Dg Chest Port 1 View  Result Date: 02/05/2017 CLINICAL DATA:  Assess positioning of the nasogastric tube EXAM: PORTABLE CHEST 1 VIEW COMPARISON:  Portable chest x-ray of January 27, 2017 FINDINGS: The nasogastric tubes proximal port projects above the GE junction. The tip projects below the inferior margin of the image. There is gas under the left hemidiaphragm which may lie within bowel but free air is not excluded. There is some gas in the upper quadrant on the left. The cardiac silhouette is enlarged. The pulmonary vascularity is engorged. IMPRESSION: The esophagogastric tube's proximal port projects above the GE junction. Advancement by 10 cm is needed. Possible free extraluminal gas within the abdomen though the findings could lie within bowel. An acute abdominal series is recommended. These results will be called to the ordering clinician or representative by the Radiologist Assistant, and communication documented in the PACS or zVision Dashboard. Electronically Signed   By: David  Martinique M.D.   On: 02/05/2017 11:15   Dg Chest Port 1 View  Result Date: 01/27/2017 CLINICAL DATA:  Nasogastric tube placement.  Initial encounter. EXAM: PORTABLE CHEST 1 VIEW COMPARISON:  Chest radiograph performed 01/26/2017 FINDINGS: The patient's enteric tube is seen extending below the  diaphragm. The lungs are well-aerated and clear. There is no evidence of focal opacification, pleural effusion or pneumothorax. The cardiomediastinal silhouette is within normal limits. No acute osseous abnormalities are seen. IMPRESSION: Enteric tube noted extending below the diaphragm. Electronically Signed   By: Garald Balding M.D.   On: 01/27/2017 19:00   Dg Chest Port 1 View  Result Date: 12/30/2016 CLINICAL DATA:  Encounter for nasogastric tube placement. EXAM: PORTABLE CHEST 1 VIEW COMPARISON:  Radiograph of same day. FINDINGS: Stable cardiomediastinal silhouette. No pneumothorax or pleural effusion is noted. The lungs are clear. Bony thorax is unremarkable. Distal tip of nasogastric tube is coiled within the proximal stomach. IMPRESSION: Distal tip of nasogastric tube is seen coiled within proximal stomach. Electronically Signed   By: Marijo Conception, M.D.   On: 01/14/2017 16:54   Dg Chest Portable 1 View  Result Date: 01/16/2017 CLINICAL DATA:  NG tube placement. EXAM: PORTABLE CHEST 1 VIEW COMPARISON:  10/18/2013. FINDINGS: NG tube noted. Its tip appears coiled in the distal esophagus. Cardiomegaly with normal pulmonary vascularity. Low lung volumes. No pleural effusion or pneumothorax. Mild gastric distention . IMPRESSION: 1. NG tube noted, its tip appears coiled in the distal esophagus. Repositioning should be considered. Mild gastric distention. 2.  Low lung volumes with mild basilar atelectasis. Critical Value/emergent results were called by telephone at the time of interpretation on 01/03/2017 at 4:07  pm to Dr. Alvino Chapel, who verbally acknowledged these results. Electronically Signed   By: Marcello Moores  Register   On: 01/17/2017 16:10   Dg Abd Portable 1v  Result Date: 01/31/2017 CLINICAL DATA:  NG tube placement EXAM: PORTABLE ABDOMEN - 1 VIEW COMPARISON:  None. FINDINGS: There is significant gaseous distension of small bowel with progression from prior exam. In mid abdomen small bowel measures  at least 7.3 cm in diameter with progression from prior exam. NG tube is noted coiled within proximal stomach with tip in proximal stomach lateral. Stable postsurgical changes lumbar spine. IMPRESSION: Significant diffuse gaseous distension of the small bowel up to 7.3 cm highly suspicious for small bowel obstruction. There is progression from prior exam. NG tube coiled within proximal stomach with tip in proximal stomach laterally. Electronically Signed   By: Lahoma Crocker M.D.   On: 01/31/2017 11:22   Dg Abd Portable 1v  Result Date: 01/26/2017 CLINICAL DATA:  Small bowel obstruction, nasogastric tube placement EXAM: PORTABLE ABDOMEN - 1 VIEW COMPARISON:  Portable exam 1706 hours compared to 01/26/2017 at 0819 hours FINDINGS: Tube nasogastric tube projects over proximal stomach with proximal side-port potentially just above the gastroesophageal junction; recommend advancing tube 4 cm. Marked gas distention of small bowel loops up to 7.1 cm diameter. No definite bowel wall thickening. No significant colonic gas. Prior lumbar fusion. Diffuse osseous demineralization. IMPRESSION: Persistent small bowel dilatation/obstruction. Recommend advancing nasogastric tube 4 cm. Findings called to [place] on [address] on 01/26/2017 at 1738 hours. Electronically Signed   By: Lavonia Dana M.D.   On: 01/26/2017 17:39   Dg Abd Portable 2v  Result Date: 01/27/2017 CLINICAL DATA:  Followup small bowel obstruction. EXAM: PORTABLE ABDOMEN - 2 VIEW COMPARISON:  Yesterday. FINDINGS: Multiple dilated small bowel loops with a mild decrease in caliber. The maximum small bowel diameter is currently 6.4 cm. No bowel wall thickening or pneumatosis seen. No free peritoneal air. Nasogastric tube tip and side hole in the proximal stomach. Post laminectomy changes and fixation hardware in the lumbar spine. Thoracolumbar spine degenerative changes and scoliosis. IMPRESSION: Mild improved small-bowel obstruction. Electronically Signed   By:  Claudie Revering M.D.   On: 01/27/2017 07:56   Dg Abd Portable 2v  Result Date: 01/26/2017 CLINICAL DATA:  Abdominal pain. EXAM: PORTABLE ABDOMEN - 2 VIEW COMPARISON:  01/25/2017. FINDINGS: NG tube noted coiled in stomach. Persistent severely distended loops of small bowel are noted consistent small bowel obstruction. Findings consistent with persistent small-bowel obstruction. Some degree of air is noted colon. No free air is noted. Calcifications in the right upper quadrant consistent gallstones. Pelvic calcifications consistent phleboliths. Prior lumbar spine fusion. No free air. No free air. Lumbar spine fusion. IMPRESSION: 1. NG tube noted coiled stomach. Persistent severely distended loops of small bowel are again noted consistent small bowel obstruction. Some degree of air is noted in the colon No free air noted . 2.  Gallstones. Electronically Signed   By: Marcello Moores  Register   On: 01/26/2017 08:55   Dg Abd Portable 2v  Result Date: 01/25/2017 CLINICAL DATA:  Small bowel obstruction EXAM: PORTABLE ABDOMEN - 2 VIEW COMPARISON:  CT abdomen and pelvis January 24, 2017 FINDINGS: Supine and upright images obtained. Nasogastric tube tip and side port in stomach. There remains small bowel dilatation with air-fluid levels in a pattern suggesting persistent bowel obstruction. No evident free air. There is postoperative change in the lumbar spine. There are phleboliths in the pelvis. Is normal. There is no evidence of  free air. No radio-opaque calculi or other significant radiographic abnormality is seen. IMPRESSION: Persistence of small bowel obstruction. No free air evident. Nasogastric tube tip and side port in stomach. Electronically Signed   By: Lowella Grip III M.D.   On: 01/25/2017 11:01    Labs:  CBC:  Recent Labs  02/03/17 0636 02/05/17 0414 02/06/17 0414 02/07/17 0410  WBC 9.3 6.5 7.4 7.3  HGB 13.4 12.9* 12.9* 11.5*  HCT 40.5 38.5* 38.9* 33.9*  PLT 209 253 218 198    COAGS:  Recent  Labs  02/07/17 0410  INR 1.23    BMP:  Recent Labs  02/05/17 0414 02/06/17 0414 02/07/17 0410 02/08/17 0530  NA 135 136 139 143  K 4.2 3.7 3.4* 3.2*  CL 97* 95* 99* 101  CO2 28 32 33* 36*  GLUCOSE 131* 156* 165* 165*  BUN 63* 69* 72* 64*  CALCIUM 7.9* 7.6* 7.5* 7.6*  CREATININE 2.12* 1.75* 1.49* 1.33*  GFRNONAA 27* 34* 41* 47*  GFRAA 31* 39* 48* 55*    LIVER FUNCTION TESTS:  Recent Labs  02/02/17 0518 02/05/17 0414 02/07/17 0410 02/08/17 0530  BILITOT 0.7 0.5 0.8 1.6*  AST _0 35  ALT 33 15* 20 24  ALKPHOS 52 31* 49 88  PROT 5.5* 5.2* 4.7* 5.0*  ALBUMIN 2.2* 1.7* 1.4* 1.4*    TUMOR MARKERS: No results for input(s): AFPTM, CEA, CA199, CHROMGRNA in the last 8760 hours.  Assessment and Plan:  Intra abdominal abscess(es) Scheduled for drain(s) placement Risks and Benefits discussed with the patient and son including bleeding, infection, damage to adjacent structures, bowel perforation/fistula connection, and sepsis. All of the patient's and sons questions were answered, they are agreeable to proceed. Consent signed and in chart.   Thank you for this interesting consult.  I greatly enjoyed meeting Joshua Downs and look forward to participating in their care.  A copy of this report was sent to the requesting provider on this date.  Electronically Signed: , A 02/08/2017, 11:30 AM   I spent a total of 40 Minutes    in face to face in clinical consultation, greater than 50% of which was counseling/coordinating care for intra abdominal abscess drain(s)

## 2017-02-09 DIAGNOSIS — L0291 Cutaneous abscess, unspecified: Secondary | ICD-10-CM

## 2017-02-09 LAB — BASIC METABOLIC PANEL
Anion gap: 6 (ref 5–15)
BUN: 56 mg/dL — AB (ref 6–20)
CALCIUM: 7.6 mg/dL — AB (ref 8.9–10.3)
CO2: 40 mmol/L — ABNORMAL HIGH (ref 22–32)
CREATININE: 1.38 mg/dL — AB (ref 0.61–1.24)
Chloride: 100 mmol/L — ABNORMAL LOW (ref 101–111)
GFR calc Af Amer: 53 mL/min — ABNORMAL LOW (ref >60)
GFR, EST NON AFRICAN AMERICAN: 45 mL/min — AB (ref >60)
Glucose, Bld: 133 mg/dL — ABNORMAL HIGH (ref 65–99)
Potassium: 3.2 mmol/L — ABNORMAL LOW (ref 3.5–5.1)
SODIUM: 146 mmol/L — AB (ref 135–145)

## 2017-02-09 LAB — GLUCOSE, CAPILLARY
GLUCOSE-CAPILLARY: 177 mg/dL — AB (ref 65–99)
GLUCOSE-CAPILLARY: 147 mg/dL — AB (ref 65–99)
GLUCOSE-CAPILLARY: 136 mg/dL — AB (ref 65–99)
Glucose-Capillary: 145 mg/dL — ABNORMAL HIGH (ref 65–99)
Glucose-Capillary: 126 mg/dL — ABNORMAL HIGH (ref 65–99)

## 2017-02-09 MED ORDER — POTASSIUM CHLORIDE 10 MEQ/100ML IV SOLN
10.0000 meq | INTRAVENOUS | Status: AC
  Administered 2017-02-09 (×4): 10 meq via INTRAVENOUS
  Filled 2017-02-09: qty 100

## 2017-02-09 MED ORDER — TRACE MINERALS CR-CU-MN-SE-ZN 10-1000-500-60 MCG/ML IV SOLN
80.0000 mL/h | INTRAVENOUS | Status: AC
  Administered 2017-02-09: 18:00:00 via INTRAVENOUS
  Filled 2017-02-09: qty 1920

## 2017-02-09 MED ORDER — FAT EMULSION 20 % IV EMUL
250.0000 mL | INTRAVENOUS | Status: AC
  Administered 2017-02-09: 250 mL via INTRAVENOUS
  Filled 2017-02-09: qty 250

## 2017-02-09 MED ORDER — POTASSIUM CHLORIDE 10 MEQ/100ML IV SOLN
10.0000 meq | INTRAVENOUS | Status: DC
  Filled 2017-02-09: qty 100

## 2017-02-09 NOTE — Care Management Important Message (Signed)
Important Message  Patient Details  Name: Joshua Downs MRN: [id number] Date of Birth: [birth year]   Medicare Important Message Given:  Yes    Sherald Barge, RN 02/09/2017, 2:46 PM

## 2017-02-09 NOTE — Progress Notes (Signed)
PHARMACY - ADULT TOTAL PARENTERAL NUTRITION CONSULT NOTE   Pharmacy Consult for TPN Indication: Bowel obstruction, surgery 4/5  Patient Measurements: Height: 6' (182.9 cm) Weight: 161 lb 6.4 oz (73.2 kg) IBW/kg (Calculated) : 77.6 TPN AdjBW (KG): 73.2 Body mass index is 21.89 kg/m.  Assessment: 83 year old male with history of hypertension, remote left kidney carcinoma status post partial nephrectomy, chronic bradycardia, recent back surgery who presented with abdominal pain for past 3 days. Denied any fevers, chills, nausea or vomiting. Denied dysuria or diarrhea.  Pt had surgery on 02/10/2017 for SBO. Patient has still not had return of bowel function. NG tube was replaced, CT scan of abdomen + for bowel perforation.   IR placed drainage tube 4/12.   Glucose control remains elevated with BS 149-177 ( 8 units SSI last 24 hours).  SCr slight increase. Slight increase in Na and CO2.  Ca++ corrects to normal when accounting for low albumin.  GI: Pt had gone approximately 10 days with minimal to no nutrition. At this time, TPN warranted as patient showed intolerance to clear liquids.    Insulin requirements in the past 24 hours: none BMP Latest Ref Rng & Units 02/09/2017 02/08/2017 02/07/2017  Glucose 65 - 99 mg/dL 133(H) 165(H) 165(H)  BUN 6 - 20 mg/dL 56(H) 64(H) 72(H)  Creatinine 0.61 - 1.24 mg/dL 1.38(H) 1.33(H) 1.49(H)  Sodium 135 - 145 mmol/L 146(H) 143 139  Potassium 3.5 - 5.1 mmol/L 3.2(L) 3.2(L) 3.4(L)  Chloride 101 - 111 mmol/L 100(L) 101 99(L)  CO2 22 - 32 mmol/L 40(H) 36(H) 33(H)  Calcium 8.9 - 10.3 mg/dL 7.6(L) 7.6(L) 7.5(L)   TPN Access: central IV access TPN start date: 01/31/17 Current Nutrition: none  Nutritional Goals (per RD recommendation on 01/30/2017): Estimated Nutritional Needs:  Kcal:  2050-2250 (28-31 kcal/kg bw) Protein:  88-102 g Pro (1.2-1.4 g/kg bw) Fluid:  >1.8 L (25 ml/kg bw)  Plan:  TPN rate at goal  Continue Clinimix E 5/15 at 69ml/hr  20% lipid emulsion  at 59ml/hr (infuse over 12 hours)  Maintenance IV fluids per MD  TPN provides 96 g of protein and total calories: 1843 kCals per day   K 3.2--> will give KCL 90meq x4 IV runs  MVI and trace elements in TPN  Increase Regular Insulin 20 units/bag  Change SSI q8h CBG checks  Monitor TPN labs, renal fxn, fluid balance, glucose tolerance  Pricilla Larsson, [facility]  02/09/2017,11:37 AM

## 2017-02-09 NOTE — Progress Notes (Signed)
PROGRESS NOTE    Joshua Downs  DQQ:[id number] DOB: 1931-01-08 DOA: 01/19/2017 PCP: Deloria Lair, MD    Brief Narrative: 83 year old man initially admitted to the hospital on 3/28 due to abdominal pain and found to have a small bowel obstruction. CT scan of the abdomen showed a high-grade small bowel obstruction with transition point in the mid abdomen. Was also noted to have acute kidney injury. NG tube was placed in the emergency department, surgical services were consulted. NG tube was initially removed on 3/30 and was reinserted on the same day due to recurrence of distention. NG tube was again removed on 4/3 due to patient's symptoms improving, unfortunately he has failed again with increased vomiting and NG tube has been replaced on 4/4. OR on 4/5 for ex lap with lysis of adhesions. Very slow to progress post op. Still with abd pain and distention. CT showed extravasatioin of contrast concerning for bowel perforation. He had IR placement of drainage tube, and is doing much better today.  He is on TPN   Assessment & Plan:   Principal Problem:   Small bowel obstruction Active Problems:   Acute renal failure (ARF) (HCC)   Hyperglycemia   Protein-calorie malnutrition, severe   Malnutrition of moderate degree  Small bowel obstruction -He has failed conservative management and went to OR on 4/5 for ex lap and lysis of adhesions. -Follow surgical recommendations: keep NG in today. He is still having significant abdominal distention and hypertympani.  -Continue TPN. -CT scan 4/9with large amount of free air but no leakage of contrast. D/W Dr. Arnoldo Morale plan to redo CT and after that recommendations will follow. -Repeat CT 4/10 with extraluminal fluid/gas with extravasation of contrast concerning for bowel perforation. Dr. Arnoldo Morale aware. IR has placed drainage tube on April 12.  Doing better. Continue with Zosyn. No fever, or leukocytosys. -Still has not had return of bowel  function.  Severe protein caloric malnutrition -Continue TPN. -Appreciate dietitian and pharmacy recommendations.  Hypernatremia -Resolved.  Hypertension -By mouth meds on hold given nothing by mouth state. -BP remains low normal.  Acute on CKD Stage III -Baseline Cr aroung 1.1-1.2. -Cr maxed at2.12. -down to 1.75 on 4/10. Now at 1.4  Wide Complex Tachycardia -Discussed via phone with cards, Dr. Meda Coffee and also with Dr. Bronson Ing. -Appears to be simply sinus tach with a wide QRS due to his RBBB. -After fluid bolus, HR has returned to 80s-90s.  DVT prophylaxis:Lovenox Code Status:Full code Family Communication:Sons at bedside updated on plan of care and all questions answered on 4/9. Disposition Plan:To be determined pending medical stability.  Consultants:  Surgery.   Procedures: Exp Lap with lysis of adhesion.   Antimicrobials: Anti-infectives    Start     Dose/Rate Route Frequency Ordered Stop   02/06/17 1700  piperacillin-tazobactam (ZOSYN) IVPB 3.375 g     3.375 g 12.5 mL/hr over 240 Minutes Intravenous Every 8 hours 02/06/17 1629     02/02/17 0900  cefTRIAXone (ROCEPHIN) 2 g in dextrose 5 % 50 mL IVPB     2 g 100 mL/hr over 30 Minutes Intravenous  Once 02/25/2017 1429 02/02/17 0930   02/18/2017 1730  metroNIDAZOLE (FLAGYL) IVPB 500 mg     500 mg 100 mL/hr over 60 Minutes Intravenous Every 8 hours 02/07/2017 1429 02/02/17 0325   02/26/2017 0822  cefTRIAXone (ROCEPHIN) 2 g in dextrose 5 % 50 mL IVPB     2 g 100 mL/hr over 30 Minutes Intravenous On call to O.R. 02/17/2017 1941  02/16/2017 0920   01/31/2017 0822  metroNIDAZOLE (FLAGYL) IVPB 500 mg  Status:  Discontinued     500 mg 100 mL/hr over 60 Minutes Intravenous Every 8 hours 02/04/2017 0822 02/10/2017 1330       Subjective:  Doing better.   Objective: Vitals:   02/08/17 1351 02/08/17 1401 02/08/17 1534 02/08/17 2135  BP: 117/83 (!) 116/93 113/76 115/65  Pulse: (!) 109 (!) 112 (!) 109 (!) 111   Resp: 18 18  20   Temp:    98.7 F (37.1 C)  TempSrc:    Axillary  SpO2: 98% (!) 25% 98% 99%  Weight:      Height:        Intake/Output Summary (Last 24 hours) at 02/09/17 1603 Last data filed at 02/09/17 1300  Gross per 24 hour  Intake             2890 ml  Output             3125 ml  Net             -235 ml   Filed Weights   01/02/2017 1204 01/08/2017 1807  Weight: 76.2 kg (168 lb) 73.2 kg (161 lb 6.4 oz)    Examination:  General exam: Appears calm and comfortable  Respiratory system: Clear to auscultation. Respiratory effort normal. Cardiovascular system: S1 & S2 heard, RRR. No JVD, murmurs, rubs, gallops or clicks. No pedal edema. Gastrointestinal system: Abdomen is distended, soft and nontender. No organomegaly or masses felt.  Central nervous system: Alert and oriented. No focal neurological deficits. Extremities: Symmetric 5 x 5 power. Skin: No rashes, lesions or ulcers Psychiatry: Judgement and insight appear normal. Mood & affect appropriate.   Data Reviewed: I have personally reviewed following labs and imaging studies  CBC:  Recent Labs Lab 02/03/17 0636 02/05/17 0414 02/06/17 0414 02/07/17 0410  WBC 9.3 6.5 7.4 7.3  NEUTROABS  --  5.3  --  6.1  HGB 13.4 12.9* 12.9* 11.5*  HCT 40.5 38.5* 38.9* 33.9*  MCV 86.0 85.6 85.9 85.2  PLT 209 253 218 716   Basic Metabolic Panel:  Recent Labs Lab 02/05/17 0414 02/06/17 0414 02/07/17 0410 02/08/17 0530 02/09/17 0936  NA 135 136 139 143 146*  K 4.2 3.7 3.4* 3.2* 3.2*  CL 97* 95* 99* 101 100*  CO2 28 32 33* 36* 40*  GLUCOSE 131* 156* 165* 165* 133*  BUN 63* 69* 72* 64* 56*  CREATININE 2.12* 1.75* 1.49* 1.33* 1.38*  CALCIUM 7.9* 7.6* 7.5* 7.6* 7.6*  MG 1.9 1.9  --  2.1  --   PHOS 4.0 4.6  --  4.7*  --    GFR: Estimated Creatinine Clearance: 41.3 mL/min (A) (by C-G formula based on SCr of 1.38 mg/dL (H)). Liver Function Tests:  Recent Labs Lab 02/05/17 0414 02/07/17 0410 02/08/17 0530  AST 20 26 35   ALT 15* 20 24  ALKPHOS 31* 49 88  BILITOT 0.5 0.8 1.6*  PROT 5.2* 4.7* 5.0*  ALBUMIN 1.7* 1.4* 1.4*   Coagulation Profile:  Recent Labs Lab 02/07/17 0410  INR 1.23   CBG:  Recent Labs Lab 02/08/17 2233 02/09/17 0607 02/09/17 0746 02/09/17 1128 02/09/17 1340  GLUCAP 153* 177* 147* 136* 145*    Recent Results (from the past 240 hour(s))  Surgical pcr screen     Status: Abnormal   Collection Time: 01/31/17 11:46 PM  Result Value Ref Range Status   MRSA, PCR NEGATIVE NEGATIVE Final   Staphylococcus aureus  POSITIVE (A) NEGATIVE Final    Comment:        The Xpert SA Assay (FDA approved for NASAL specimens in patients over 62 years of age), is one component of a comprehensive surveillance program.  Test performance has been validated by [facility] for patients greater than or equal to 29 year old. It is not intended to diagnose infection nor to guide or monitor treatment. RESULT CALLED TO, READ BACK BY AND VERIFIED WITH:  TETREAULT,H @ 0150 ON 01/28/2017 BY JUW   Aerobic/Anaerobic Culture (surgical/deep wound)     Status: None (Preliminary result)   Collection Time: 02/08/17  2:10 PM  Result Value Ref Range Status   Specimen Description ABSCESS  Final   Special Requests TRANSGLUTEAL DRAIN  Final   Gram Stain   Final    FEW WBC PRESENT, PREDOMINANTLY PMN NO ORGANISMS SEEN    Culture NO GROWTH < 24 HOURS  Final   Report Status PENDING  Incomplete  Aerobic/Anaerobic Culture (surgical/deep wound)     Status: None (Preliminary result)   Collection Time: 02/08/17  2:11 PM  Result Value Ref Range Status   Specimen Description ABSCESS  Final   Special Requests RUQ PERIHEPATIC DRAIN  Final   Gram Stain   Final    ABUNDANT WBC PRESENT, PREDOMINANTLY PMN NO ORGANISMS SEEN    Culture CULTURE REINCUBATED FOR BETTER GROWTH  Final   Report Status PENDING  Incomplete     Radiology Studies: Ct Image Guided Drainage By Percutaneous Catheter  Result Date:  02/08/2017 CLINICAL DATA:  Recent small bowel obstruction status post lysis of adhesions. Postop CTs demonstrates Fluid/gas collection arising from the right lower abdomen near the cecum, extending into the anterior mid abdomen, and superiorly along the right hepatic dome. Suspected extravasation of contrast into the collection, suggesting bowel perforation. This has progressed from the recent prior. Additional small to moderate cul-de-sac collection. EXAM: 1. CT GUIDED DRAINAGE OF PELVIC ABSCESS 2. CT GUIDED DRAINAGE OF RIGHT UPPER QUADRANT PERITONEAL ABSCESS ANESTHESIA/SEDATION: Intravenous Fentanyl and Versed were administered as conscious sedation during continuous monitoring of the patient's level of consciousness and physiological / cardiorespiratory status by the radiology RN, with a total moderate sedation time of 20 minutes. PROCEDURE: The procedure, risks, benefits, and alternatives were explained to the patient. Questions regarding the procedure were encouraged and answered. The patient understands and consents to the procedure. Patient placed in left anterior oblique position on the CT gantry. Axial scans through abdomen pelvis obtained in the cul-de-sac and right upper quadrant perihepatic collections were localized. Appropriate skin entry sites were marked. The pelvic field was prepped with chlorhexidinein a sterile fashion, and a sterile drape was applied covering the operative field. A sterile gown and sterile gloves were used for the procedure. Local anesthesia was provided with 1% Lidocaine. Under CT fluoroscopic guidance, an 18 gauge trocar needle was advanced into the cul-de-sac loculated collection. Greenish fluid could be aspirated. An Amplatz guidewire advanced easily within the collection, its position confirmed on CT. Tract dilated to facilitate placement of a 12 French pigtail drain catheter, placed centrally within the collection. 60 mL of the greenish thin aspirate were sent for Gram  stain, culture and sensitivity. Catheter was secured externally with 0 Prolene suture and StatLock and placed to external gravity drain bag. In similar fashion, using all new sterile equipment, the right upper abdominal field was prepped with chlorhexidinein a sterile fashion, and a sterile drape was applied covering the operative field. A sterile gown and sterile  gloves were used for the procedure. Local anesthesia was provided with 1% Lidocaine. Under CT fluoroscopic guidance, an 18 gauge percutaneous entry needle was advanced into the loculated perihepatic collection. Greenish fluid could be aspirated. An Amplatz guidewire advanced easily within the collection over the dome of the liver, its position confirmed on CT. Tract dilated to facilitate placement of a 12 French pigtail drain catheter, placed centrally within the collection. 30 mL of the greenish thin aspirate were sent for Gram stain, culture and sensitivity. Catheter was secured externally with 0 Prolene suture and StatLock and placed to external gravity drain bag. The patient tolerated the procedure well. COMPLICATIONS: None immediate FINDINGS: Twelve French drain catheter placed in the cul-de-sac collection returning thin greenish fluid, a sample sent for Gram stain and culture. Twelve French drain catheter placed into the right perihepatic gas and fluid collection, returning thin greenish fluid, a sample sent for Gram stain and culture. IMPRESSION: 1. Technically successful CT-guided pelvic abscess drain catheter placement. 2. Technically successful CT-guided perihepatic abscess drain catheter placement. Electronically Signed   By: Lucrezia Europe M.D.   On: 02/08/2017 16:28   Ct Image Guided Drainage By Percutaneous Catheter  Result Date: 02/08/2017 CLINICAL DATA:  Recent small bowel obstruction status post lysis of adhesions. Postop CTs demonstrates Fluid/gas collection arising from the right lower abdomen near the cecum, extending into the anterior  mid abdomen, and superiorly along the right hepatic dome. Suspected extravasation of contrast into the collection, suggesting bowel perforation. This has progressed from the recent prior. Additional small to moderate cul-de-sac collection. EXAM: 1. CT GUIDED DRAINAGE OF PELVIC ABSCESS 2. CT GUIDED DRAINAGE OF RIGHT UPPER QUADRANT PERITONEAL ABSCESS ANESTHESIA/SEDATION: Intravenous Fentanyl and Versed were administered as conscious sedation during continuous monitoring of the patient's level of consciousness and physiological / cardiorespiratory status by the radiology RN, with a total moderate sedation time of 20 minutes. PROCEDURE: The procedure, risks, benefits, and alternatives were explained to the patient. Questions regarding the procedure were encouraged and answered. The patient understands and consents to the procedure. Patient placed in left anterior oblique position on the CT gantry. Axial scans through abdomen pelvis obtained in the cul-de-sac and right upper quadrant perihepatic collections were localized. Appropriate skin entry sites were marked. The pelvic field was prepped with chlorhexidinein a sterile fashion, and a sterile drape was applied covering the operative field. A sterile gown and sterile gloves were used for the procedure. Local anesthesia was provided with 1% Lidocaine. Under CT fluoroscopic guidance, an 18 gauge trocar needle was advanced into the cul-de-sac loculated collection. Greenish fluid could be aspirated. An Amplatz guidewire advanced easily within the collection, its position confirmed on CT. Tract dilated to facilitate placement of a 12 French pigtail drain catheter, placed centrally within the collection. 60 mL of the greenish thin aspirate were sent for Gram stain, culture and sensitivity. Catheter was secured externally with 0 Prolene suture and StatLock and placed to external gravity drain bag. In similar fashion, using all new sterile equipment, the right upper abdominal  field was prepped with chlorhexidinein a sterile fashion, and a sterile drape was applied covering the operative field. A sterile gown and sterile gloves were used for the procedure. Local anesthesia was provided with 1% Lidocaine. Under CT fluoroscopic guidance, an 18 gauge percutaneous entry needle was advanced into the loculated perihepatic collection. Greenish fluid could be aspirated. An Amplatz guidewire advanced easily within the collection over the dome of the liver, its position confirmed on CT. Tract dilated to facilitate  placement of a 12 French pigtail drain catheter, placed centrally within the collection. 30 mL of the greenish thin aspirate were sent for Gram stain, culture and sensitivity. Catheter was secured externally with 0 Prolene suture and StatLock and placed to external gravity drain bag. The patient tolerated the procedure well. COMPLICATIONS: None immediate FINDINGS: Twelve French drain catheter placed in the cul-de-sac collection returning thin greenish fluid, a sample sent for Gram stain and culture. Twelve French drain catheter placed into the right perihepatic gas and fluid collection, returning thin greenish fluid, a sample sent for Gram stain and culture. IMPRESSION: 1. Technically successful CT-guided pelvic abscess drain catheter placement. 2. Technically successful CT-guided perihepatic abscess drain catheter placement. Electronically Signed   By: Lucrezia Europe M.D.   On: 02/08/2017 16:28    Scheduled Meds: . diclofenac sodium  2 g Topical Daily  . insulin aspart  0-15 Units Subcutaneous Q8H  . LORazepam  0.5 mg Intravenous QHS  . piperacillin-tazobactam (ZOSYN)  IV  3.375 g Intravenous Q8H  . potassium chloride  10 mEq Intravenous Q1 Hr x 4  . sodium chloride  500 mL Intravenous Once   Continuous Infusions: . Marland KitchenTPN (CLINIMIX-E) Adult 80 mL/hr (02/08/17 1730)  . Marland KitchenTPN (CLINIMIX-E) Adult     And  . fat emulsion    . sodium chloride 125 mL/hr at 02/09/17 0846     LOS: 16  days   Shasta Chinn, MD [facility].   If 7PM-7AM, please contact night-coverage [email or web address] Password [facility] 02/09/2017, 4:03 PM

## 2017-02-09 NOTE — Evaluation (Signed)
Physical Therapy Re-Evaluation Patient Details Name: Joshua Downs MRN: [id number] DOB: Jul 22, 1931 Today's Date: 02/09/2017   History of Present Illness  83 y.o. male with medical history significant of macular degeneration; HTN; remote left kidney CA s/p partial nephrectomy; chronic bradycardia; and recent back surgery presenting with abdominal pain.  He came by his PCP office on Monday and tried to get an appointment - his abdomen was swelling, no BMs, no UOP, n/v.  Symptoms started Saturday night into Sunday morning.  He was seen today and they sent him to the ER.  Started with periumbilical pain - fleeting but would take your breath.  Got to the point where he didn't want to eat/drink.  Black emesis.  Last night about 0100, vomited more black emesis.  Last BM was Saturday AM and was normal.  No fevers.  Unable to pee up to once a day right now.  Last void was last night.  No burning with urination.  Abdominal surgeries: hernia; renal cancer.  CT: Severe high-grade SBO with abrupt transition point in the mid-abdomen.  02/02/17 Pt is now s/p exploratory laparotomy with lysis of adhesions and repair of Left-sided enterotomies x4 for refractory small bowel obstruction secondary to internal hernia associated with post-surgical adhesions following remote hand-assisted Left partial nephrectomy for malignancy, complicated by small-moderate amount of narcotics for post-surgical pain s/p recent spine surgery.  02/08/17 Pt is s/p CT guided transgluteal abscess drain catheter placement and CT guided perihepatic abscess drain catheter placement.    Clinical Impression  Pt received in bed, and is agreeable to PT re-evaluation.  Pt required Mod A for supine<>sit with HOB raised.  Pt then requires Min A for sit<>stand with RW.  Pt was able to take a few steps to transfer bed<>chair, however was fatigued afterwards, and therefore gait not assessed.  Continue to recommend SNF vs HHPT pending pt's progress.     Follow Up  Recommendations Home health PT;SNF (pending pt's progress)    Equipment Recommendations  None recommended by PT    Recommendations for Other Services       Precautions / Restrictions Precautions Precautions: Fall Precaution Comments: LOG ROLL for bed mobility.  Recent back surgery on the 01/04/2017 - Left LUMBAR ONE - LUMBAR TWO  LAMINOTOMY AND MICRODISCECTOMY, and now s/p abdominal ex lap.  NG TUBE.   Restrictions Weight Bearing Restrictions: No      Mobility  Bed Mobility Overal bed mobility: Needs Assistance     Sidelying to sit: Mod assist;HOB elevated       General bed mobility comments: HOB significantly raised, and use of bed pad to assist pt's hips to the EOB.    Transfers Overall transfer level: Needs assistance Equipment used: Rolling walker (2 wheeled) Transfers: Sit to/from [facility] Sit to Stand: Min assist Stand pivot transfers: Min guard          Ambulation/Gait Ambulation/Gait assistance:  (NA due to fatigue with transfer. )              Stairs            Wheelchair Mobility    Modified Rankin (Stroke Patients Only)       Balance Overall balance assessment: Needs assistance Sitting-balance support: Feet supported;Bilateral upper extremity supported       Standing balance support: Bilateral upper extremity supported Standing balance-Leahy Scale: Fair  Pertinent Vitals/Pain Pain Assessment: Faces Faces Pain Scale: Hurts even more Pain Location: Abdomen Pain Intervention(s): Limited activity within patient's tolerance;Monitored during session;Repositioned    Home Living   Living Arrangements: Children (dtr hom most of the time. )   Type of Home: House Home Access: Stairs to enter   [employer] of Steps: front - 2, and 4-5 on the back that aren't used much.  Home Layout: One level Home Equipment: Cane - single point;Walker - 2 wheels;Bedside  commode;Shower seat (lift chair)      Prior Function     Gait / Transfers Assistance Needed: Pt uses the cane most of the time due to balance due to back surgeries.  Still driving, Unlimited community ambulation.   ADL's / Homemaking Assistance Needed: independent        Hand Dominance   Dominant Hand: Right    Extremity/Trunk Assessment   Upper Extremity Assessment Upper Extremity Assessment: Generalized weakness    Lower Extremity Assessment Lower Extremity Assessment: Generalized weakness       Communication   Communication: No difficulties  Cognition Arousal/Alertness: Awake/alert Behavior During Therapy: WFL for tasks assessed/performed Overall Cognitive Status: Within Functional Limits for tasks assessed                                        General Comments      Exercises     Assessment/Plan    PT Assessment Patient needs continued PT services  PT Problem List Decreased strength;Decreased activity tolerance;Decreased balance;Decreased mobility;Decreased skin integrity;Pain       PT Treatment Interventions DME instruction;Gait training;Functional mobility training;Therapeutic activities;Therapeutic exercise;Balance training;Patient/family education    PT Goals (Current goals can be found in the Care Plan section)  Acute Rehab PT Goals Patient Stated Goal: To get stronger and go back home.  PT Goal Formulation: With patient Time For Goal Achievement: 02/07/17 Potential to Achieve Goals: Fair    Frequency Min 5X/week   Barriers to discharge        Co-evaluation               End of Session Equipment Utilized During Treatment: Gait belt;Oxygen Activity Tolerance: Patient limited by fatigue Patient left: in chair;with call bell/phone within reach;with family/visitor present Nurse Communication: Mobility status PT Visit Diagnosis: Muscle weakness (generalized) (M62.81);Other abnormalities of gait and mobility (R26.89)     Time: 1423-1500 PT Time Calculation (min) (ACUTE ONLY): 37 min   Charges:   PT Evaluation $PT Re-evaluation: 1 Procedure PT Treatments $Therapeutic Activity: 8-22 mins   PT G Codes:   PT G-Codes **NOT FOR INPATIENT CLASS** Functional Assessment Tool Used: AM-PAC 6 Clicks Basic Mobility;Clinical judgement Functional Limitation: Mobility: Walking and moving around;Changing and maintaining body position Mobility: Walking and Moving Around Current Status [phone]): At least 40 percent but less than 60 percent impaired, limited or restricted Mobility: Walking and Moving Around Goal Status [phone]): At least 20 percent but less than 40 percent impaired, limited or restricted    Beth Briel Gallicchio, PT, DPT X: [phone]

## 2017-02-09 NOTE — Progress Notes (Signed)
Pharmacy Antibiotic Note  Joshua Downs is a 83 y.o. male admitted on 01/23/2017 with intra abdominal infection.  Pharmacy has been consulted for ZOSYN dosing.  Plan: Continue Zosyn 3.375g IV q8h (4 hour infusion).  Monitor labs, progress, c/s  Height: 6' (182.9 cm) Weight: 161 lb 6.4 oz (73.2 kg) IBW/kg (Calculated) : 77.6  Temp (24hrs), Avg:98.7 F (37.1 C), Min:98.7 F (37.1 C), Max:98.7 F (37.1 C)   Recent Labs Lab 02/03/17 0636 02/04/17 1134 02/05/17 0414 02/06/17 0414 02/07/17 0410 02/08/17 0530  WBC 9.3  --  6.5 7.4 7.3  --   CREATININE 1.51* 1.81* 2.12* 1.75* 1.49* 1.33*    Estimated Creatinine Clearance: 42.8 mL/min (A) (by C-G formula based on SCr of 1.33 mg/dL (H)).    Allergies  Allergen Reactions  . No Known Allergies    Antimicrobials this admission:  Zosyn 4/10 >>  Dose adjustments this admission:  n/a   Microbiology results:  4/12 Abscess x 2: pending  4/4 MRSA PCR: (-)   Thank you for allowing pharmacy to be a part of this patient's care.  Pricilla Larsson 02/09/2017 8:41 AM

## 2017-02-09 NOTE — Progress Notes (Signed)
8 Days Post-Op  Subjective: Patient seemed more alert today.  Objective: Vital signs in last 24 hours: Temp:  [98.7 F (37.1 C)] 98.7 F (37.1 C) (04/12 2135) Pulse Rate:  [109-115] 111 (04/12 2135) Resp:  [18-32] 20 (04/12 2135) BP: (109-139)/(65-93) 115/65 (04/12 2135) SpO2:  [25 %-99 %] 99 % (04/12 2135) Last BM Date: 02/08/17  Intake/Output from previous day: 04/12 0701 - 04/13 0700 In: 6062.5 [I.V.:5562.5; IV Piggyback:500] Out: 3025 [Urine:1175; Emesis/NG output:1400; Drains:450] Intake/Output this shift: Total I/O In: 240 [P.O.:240] Out: 250 [Urine:250]  General appearance: alert, cooperative and no distress GI: Soft, slightly less distended. 2 drains with small amount of greenish yellow drainage. No purulent drainage noted.  Lab Results:   Recent Labs  02/07/17 0410  WBC 7.3  HGB 11.5*  HCT 33.9*  PLT 198   BMET  Recent Labs  02/07/17 0410 02/08/17 0530  NA 139 143  K 3.4* 3.2*  CL 99* 101  CO2 33* 36*  GLUCOSE 165* 165*  BUN 72* 64*  CREATININE 1.49* 1.33*  CALCIUM 7.5* 7.6*   PT/INR  Recent Labs  02/07/17 0410  LABPROT 15.6*  INR 1.23    Studies/Results: Ct Image Guided Drainage By Percutaneous Catheter  Result Date: 02/08/2017 CLINICAL DATA:  Recent small bowel obstruction status post lysis of adhesions. Postop CTs demonstrates Fluid/gas collection arising from the right lower abdomen near the cecum, extending into the anterior mid abdomen, and superiorly along the right hepatic dome. Suspected extravasation of contrast into the collection, suggesting bowel perforation. This has progressed from the recent prior. Additional small to moderate cul-de-sac collection. EXAM: 1. CT GUIDED DRAINAGE OF PELVIC ABSCESS 2. CT GUIDED DRAINAGE OF RIGHT UPPER QUADRANT PERITONEAL ABSCESS ANESTHESIA/SEDATION: Intravenous Fentanyl and Versed were administered as conscious sedation during continuous monitoring of the patient's level of consciousness and  physiological / cardiorespiratory status by the radiology RN, with a total moderate sedation time of 20 minutes. PROCEDURE: The procedure, risks, benefits, and alternatives were explained to the patient. Questions regarding the procedure were encouraged and answered. The patient understands and consents to the procedure. Patient placed in left anterior oblique position on the CT gantry. Axial scans through abdomen pelvis obtained in the cul-de-sac and right upper quadrant perihepatic collections were localized. Appropriate skin entry sites were marked. The pelvic field was prepped with chlorhexidinein a sterile fashion, and a sterile drape was applied covering the operative field. A sterile gown and sterile gloves were used for the procedure. Local anesthesia was provided with 1% Lidocaine. Under CT fluoroscopic guidance, an 18 gauge trocar needle was advanced into the cul-de-sac loculated collection. Greenish fluid could be aspirated. An Amplatz guidewire advanced easily within the collection, its position confirmed on CT. Tract dilated to facilitate placement of a 12 French pigtail drain catheter, placed centrally within the collection. 60 mL of the greenish thin aspirate were sent for Gram stain, culture and sensitivity. Catheter was secured externally with 0 Prolene suture and StatLock and placed to external gravity drain bag. In similar fashion, using all new sterile equipment, the right upper abdominal field was prepped with chlorhexidinein a sterile fashion, and a sterile drape was applied covering the operative field. A sterile gown and sterile gloves were used for the procedure. Local anesthesia was provided with 1% Lidocaine. Under CT fluoroscopic guidance, an 18 gauge percutaneous entry needle was advanced into the loculated perihepatic collection. Greenish fluid could be aspirated. An Amplatz guidewire advanced easily within the collection over the dome of the liver, its  position confirmed on CT. Tract  dilated to facilitate placement of a 12 French pigtail drain catheter, placed centrally within the collection. 30 mL of the greenish thin aspirate were sent for Gram stain, culture and sensitivity. Catheter was secured externally with 0 Prolene suture and StatLock and placed to external gravity drain bag. The patient tolerated the procedure well. COMPLICATIONS: None immediate FINDINGS: Twelve French drain catheter placed in the cul-de-sac collection returning thin greenish fluid, a sample sent for Gram stain and culture. Twelve French drain catheter placed into the right perihepatic gas and fluid collection, returning thin greenish fluid, a sample sent for Gram stain and culture. IMPRESSION: 1. Technically successful CT-guided pelvic abscess drain catheter placement. 2. Technically successful CT-guided perihepatic abscess drain catheter placement. Electronically Signed   By: Lucrezia Europe M.D.   On: 02/08/2017 16:28   Ct Image Guided Drainage By Percutaneous Catheter  Result Date: 02/08/2017 CLINICAL DATA:  Recent small bowel obstruction status post lysis of adhesions. Postop CTs demonstrates Fluid/gas collection arising from the right lower abdomen near the cecum, extending into the anterior mid abdomen, and superiorly along the right hepatic dome. Suspected extravasation of contrast into the collection, suggesting bowel perforation. This has progressed from the recent prior. Additional small to moderate cul-de-sac collection. EXAM: 1. CT GUIDED DRAINAGE OF PELVIC ABSCESS 2. CT GUIDED DRAINAGE OF RIGHT UPPER QUADRANT PERITONEAL ABSCESS ANESTHESIA/SEDATION: Intravenous Fentanyl and Versed were administered as conscious sedation during continuous monitoring of the patient's level of consciousness and physiological / cardiorespiratory status by the radiology RN, with a total moderate sedation time of 20 minutes. PROCEDURE: The procedure, risks, benefits, and alternatives were explained to the patient. Questions  regarding the procedure were encouraged and answered. The patient understands and consents to the procedure. Patient placed in left anterior oblique position on the CT gantry. Axial scans through abdomen pelvis obtained in the cul-de-sac and right upper quadrant perihepatic collections were localized. Appropriate skin entry sites were marked. The pelvic field was prepped with chlorhexidinein a sterile fashion, and a sterile drape was applied covering the operative field. A sterile gown and sterile gloves were used for the procedure. Local anesthesia was provided with 1% Lidocaine. Under CT fluoroscopic guidance, an 18 gauge trocar needle was advanced into the cul-de-sac loculated collection. Greenish fluid could be aspirated. An Amplatz guidewire advanced easily within the collection, its position confirmed on CT. Tract dilated to facilitate placement of a 12 French pigtail drain catheter, placed centrally within the collection. 60 mL of the greenish thin aspirate were sent for Gram stain, culture and sensitivity. Catheter was secured externally with 0 Prolene suture and StatLock and placed to external gravity drain bag. In similar fashion, using all new sterile equipment, the right upper abdominal field was prepped with chlorhexidinein a sterile fashion, and a sterile drape was applied covering the operative field. A sterile gown and sterile gloves were used for the procedure. Local anesthesia was provided with 1% Lidocaine. Under CT fluoroscopic guidance, an 18 gauge percutaneous entry needle was advanced into the loculated perihepatic collection. Greenish fluid could be aspirated. An Amplatz guidewire advanced easily within the collection over the dome of the liver, its position confirmed on CT. Tract dilated to facilitate placement of a 12 French pigtail drain catheter, placed centrally within the collection. 30 mL of the greenish thin aspirate were sent for Gram stain, culture and sensitivity. Catheter was  secured externally with 0 Prolene suture and StatLock and placed to external gravity drain bag. The patient tolerated the  procedure well. COMPLICATIONS: None immediate FINDINGS: Twelve French drain catheter placed in the cul-de-sac collection returning thin greenish fluid, a sample sent for Gram stain and culture. Twelve French drain catheter placed into the right perihepatic gas and fluid collection, returning thin greenish fluid, a sample sent for Gram stain and culture. IMPRESSION: 1. Technically successful CT-guided pelvic abscess drain catheter placement. 2. Technically successful CT-guided perihepatic abscess drain catheter placement. Electronically Signed   By: Lucrezia Europe M.D.   On: 02/08/2017 16:28    Anti-infectives: Anti-infectives    Start     Dose/Rate Route Frequency Ordered Stop   02/06/17 1700  piperacillin-tazobactam (ZOSYN) IVPB 3.375 g     3.375 g 12.5 mL/hr over 240 Minutes Intravenous Every 8 hours 02/06/17 1629     02/02/17 0900  cefTRIAXone (ROCEPHIN) 2 g in dextrose 5 % 50 mL IVPB     2 g 100 mL/hr over 30 Minutes Intravenous  Once 02/26/2017 1429 02/02/17 0930   02/05/2017 1730  metroNIDAZOLE (FLAGYL) IVPB 500 mg     500 mg 100 mL/hr over 60 Minutes Intravenous Every 8 hours 02/18/2017 1429 02/02/17 0325   01/29/2017 0822  cefTRIAXone (ROCEPHIN) 2 g in dextrose 5 % 50 mL IVPB     2 g 100 mL/hr over 30 Minutes Intravenous On call to O.R. 02/24/2017 7681 02/24/2017 0920   02/24/2017 1572  metroNIDAZOLE (FLAGYL) IVPB 500 mg  Status:  Discontinued     500 mg 100 mL/hr over 60 Minutes Intravenous Every 8 hours 02/12/2017 0822 02/04/2017 1330      Assessment/Plan: s/p Procedure(s): EXPLORATORY LAPAROTOMY LYSIS OF ADHESION Impression: Status post percutaneous drainage of intra-abdominal fluid 2. Cultures pending. Appreciate IR assistance. Continue current management.  LOS: 16 days    Aviva Signs 02/09/2017

## 2017-02-09 NOTE — Progress Notes (Signed)
Nutrition Follow-up  DOCUMENTATION CODES:  Non-severe (moderate) malnutrition in context of acute illness/injury   INTERVENTION:  TPN per pharmacy  Pt had large BM. NGT still putting out large amounts of fluid. Once decreases, would recommend CL diet as soon as medically able as per MDs  NUTRITION DIAGNOSIS:  Altered GI function related to Post op ileus as evidenced by lack of bowel function/need for prolonged tpn.   Ongoing  GOAL:  Patient will meet greater than or equal to 90% of their needs  meeting ~83% kcal needs, 87% Protein  MONITOR:  Diet advancement, Labs, Weight trends, I & O's  ASSESSMENT:  83 y/o male PMHx HTN, Remote L kidney cancer s/p partial nephrectomy. Had recent elective back surgery 3/8. Developed abdominal pain with distension Saturday-Sunday, followed by n/v and poor appetite. Ct shows high grade SBO and AKI . admitted for management.   Interval Hx:  3/30: NGT pulled, Diet advanced to Clear liquids , but later in afternoon, showed distension and no flatus->NGT reinserted, NPO  4/3: NGT again pulled and diet advanced to CL-failed diet-n/v.  4/4: NGT reinserted, NPO, TPN initiated-indication:Bowel obstruction, malnutrition. 4/5 Surgery-exlap+ extensive LOA.  4/6 TPN advanced. Ongoing hypotension. AKI  4/7 Tpn advanced to goal. AKI/ hypotension continues 4/8 Wide Complex Tachycardia. AKI/ hypotension continues 4/9 NGT removed. AKI/ hypotension continues. CT shows large free air, no leakage 4/10: Repeat CT shows leakage. NGT Replaced 4/12: CT guided placement transgluteal and RUQ abscess drains. Had bowel movement?  Pt seen today. Less confused and easier to understand.   Pt had a BM documented yesterday. Nurse who documented unavailable but it was documented as "large". The patient confirms that he had a BM, though is not sure on size.   He denies nausea. NGT still putting out large amounts,  April 11-12: 2.2 Liters out April 12-13:1.4 Liters  out April 13-14: .85 liters thus far Today  Hopefully can be tried on CL diet once NGT output decreases further  Had been requiring more insulin. received 8 units SSI insulin last night, therefore ~3 units should be added to bag.   Current TPN: 1920 mls, Clinimix 5/15 and 250 mls 20% intralipid over 12 hrs. Providing  96 g Pro, 288 g Dex. GIR: 2.7. Provides: 1863 kcals/day  Cannot obtain bed weight as in chair after working with PT  Current NS @ 125 providing ~3 liters per 24 hrs to meet >>100% needs  Meds: Prn Fentanyl/Dilaudid, Insulin (20units in bag) Ativan, KCL,  IVF NS @125 ,  Labs: Creat: appears trending down. BG currently controleld 130-160, but requiring more insulin. Phos increasing, Na increasing   Recent Labs Lab 02/05/17 0414 02/06/17 0414 02/07/17 0410 02/08/17 0530 02/09/17 0936  NA 135 136 139 143 146*  K 4.2 3.7 3.4* 3.2* 3.2*  CL 97* 95* 99* 101 100*  CO2 28 32 33* 36* 40*  BUN 63* 69* 72* 64* 56*  CREATININE 2.12* 1.75* 1.49* 1.33* 1.38*  CALCIUM 7.9* 7.6* 7.5* 7.6* 7.6*  MG 1.9 1.9  --  2.1  --   PHOS 4.0 4.6  --  4.7*  --   GLUCOSE 131* 156* 165* 165* 133*    Diet Order:  Diet NPO time specified Except for: Sips with Meds .TPN (CLINIMIX-E) Adult .TPN (CLINIMIX-E) Adult  Skin:  Incisions to abdomen  Last BM:  4/12  Height:  Ht Readings from Last 1 Encounters:  01/05/2017 6' (1.829 m)   Weight:  Wt Readings from Last 1 Encounters:  01/27/2017 161 lb  6.4 oz (73.2 kg)   Ideal Body Weight:  80.91 kg  BMI:  Body mass index is 21.89 kg/m.  Estimated Nutritional Needs:  Kcal:  2250-2500 kcals (31-34 kcal/kg bw) Protein:  110-125 g (1.5-1.7 g/kg bw) Fluid:  2.2-2.5 L fluid (1 ml/kcal)  EDUCATION NEEDS:  No education needs identified at this time  Burtis Junes RD, LDN, [place] [facility] Pager: [phone] 02/09/2017 1:29 PM

## 2017-02-10 LAB — GLUCOSE, CAPILLARY
GLUCOSE-CAPILLARY: 146 mg/dL — AB (ref 65–99)
GLUCOSE-CAPILLARY: 179 mg/dL — AB (ref 65–99)
Glucose-Capillary: 131 mg/dL — ABNORMAL HIGH (ref 65–99)

## 2017-02-10 LAB — BASIC METABOLIC PANEL
ANION GAP: 8 (ref 5–15)
BUN: 60 mg/dL — ABNORMAL HIGH (ref 6–20)
CHLORIDE: 104 mmol/L (ref 101–111)
CO2: 35 mmol/L — ABNORMAL HIGH (ref 22–32)
Calcium: 7.6 mg/dL — ABNORMAL LOW (ref 8.9–10.3)
Creatinine, Ser: 1.54 mg/dL — ABNORMAL HIGH (ref 0.61–1.24)
GFR calc non Af Amer: 40 mL/min — ABNORMAL LOW (ref >60)
GFR, EST AFRICAN AMERICAN: 46 mL/min — AB (ref >60)
Glucose, Bld: 162 mg/dL — ABNORMAL HIGH (ref 65–99)
POTASSIUM: 3.9 mmol/L (ref 3.5–5.1)
SODIUM: 147 mmol/L — AB (ref 135–145)

## 2017-02-10 MED ORDER — TRACE MINERALS CR-CU-MN-SE-ZN 10-1000-500-60 MCG/ML IV SOLN
80.0000 mL/h | INTRAVENOUS | Status: AC
  Administered 2017-02-10: 18:00:00 via INTRAVENOUS
  Filled 2017-02-10: qty 1920

## 2017-02-10 MED ORDER — FAT EMULSION 20 % IV EMUL
250.0000 mL | INTRAVENOUS | Status: AC
  Administered 2017-02-10: 250 mL via INTRAVENOUS
  Filled 2017-02-10: qty 250

## 2017-02-10 NOTE — Progress Notes (Signed)
9 Days Post-Op  Subjective: Events of last night noted. Patient denies any abdominal pain.  Objective: Vital signs in last 24 hours: Temp:  [98.8 F (37.1 C)] 98.8 F (37.1 C) (04/14 0620) Pulse Rate:  [102-113] 102 (04/14 0620) Resp:  [20] 20 (04/14 0620) BP: (117-120)/(68-72) 120/68 (04/14 0620) SpO2:  [91 %-93 %] 93 % (04/14 0620) Last BM Date: 02/08/17  Intake/Output from previous day: 04/13 0701 - 04/14 0700 In: 4686.8 [P.O.:240; I.V.:4286.8; IV Piggyback:100] Out: 2200 [Urine:550; Emesis/NG output:1650] Intake/Output this shift: Total I/O In: -  Out: 100 [Urine:100]  General appearance: cooperative and no distress GI: Distended but softer. Does have a dressing in place with a small amount of greenish yellow drainage present. Skin is intact. Possible small fascial dehiscence is appreciated. Drain still with greenish yellow fluid. No rigidity noted. Bowel sounds active.  Lab Results:  No results for input(s): WBC, HGB, HCT, PLT in the last 72 hours. BMET  Recent Labs  02/09/17 0936 02/10/17 0846  NA 146* 147*  K 3.2* 3.9  CL 100* 104  CO2 40* 35*  GLUCOSE 133* 162*  BUN 56* 60*  CREATININE 1.38* 1.54*  CALCIUM 7.6* 7.6*   PT/INR No results for input(s): LABPROT, INR in the last 72 hours.  Studies/Results: Ct Image Guided Drainage By Percutaneous Catheter  Result Date: 02/08/2017 CLINICAL DATA:  Recent small bowel obstruction status post lysis of adhesions. Postop CTs demonstrates Fluid/gas collection arising from the right lower abdomen near the cecum, extending into the anterior mid abdomen, and superiorly along the right hepatic dome. Suspected extravasation of contrast into the collection, suggesting bowel perforation. This has progressed from the recent prior. Additional small to moderate cul-de-sac collection. EXAM: 1. CT GUIDED DRAINAGE OF PELVIC ABSCESS 2. CT GUIDED DRAINAGE OF RIGHT UPPER QUADRANT PERITONEAL ABSCESS ANESTHESIA/SEDATION: Intravenous  Fentanyl and Versed were administered as conscious sedation during continuous monitoring of the patient's level of consciousness and physiological / cardiorespiratory status by the radiology RN, with a total moderate sedation time of 20 minutes. PROCEDURE: The procedure, risks, benefits, and alternatives were explained to the patient. Questions regarding the procedure were encouraged and answered. The patient understands and consents to the procedure. Patient placed in left anterior oblique position on the CT gantry. Axial scans through abdomen pelvis obtained in the cul-de-sac and right upper quadrant perihepatic collections were localized. Appropriate skin entry sites were marked. The pelvic field was prepped with chlorhexidinein a sterile fashion, and a sterile drape was applied covering the operative field. A sterile gown and sterile gloves were used for the procedure. Local anesthesia was provided with 1% Lidocaine. Under CT fluoroscopic guidance, an 18 gauge trocar needle was advanced into the cul-de-sac loculated collection. Greenish fluid could be aspirated. An Amplatz guidewire advanced easily within the collection, its position confirmed on CT. Tract dilated to facilitate placement of a 12 French pigtail drain catheter, placed centrally within the collection. 60 mL of the greenish thin aspirate were sent for Gram stain, culture and sensitivity. Catheter was secured externally with 0 Prolene suture and StatLock and placed to external gravity drain bag. In similar fashion, using all new sterile equipment, the right upper abdominal field was prepped with chlorhexidinein a sterile fashion, and a sterile drape was applied covering the operative field. A sterile gown and sterile gloves were used for the procedure. Local anesthesia was provided with 1% Lidocaine. Under CT fluoroscopic guidance, an 18 gauge percutaneous entry needle was advanced into the loculated perihepatic collection. Greenish fluid could be  aspirated. An Amplatz guidewire advanced easily within the collection over the dome of the liver, its position confirmed on CT. Tract dilated to facilitate placement of a 12 French pigtail drain catheter, placed centrally within the collection. 30 mL of the greenish thin aspirate were sent for Gram stain, culture and sensitivity. Catheter was secured externally with 0 Prolene suture and StatLock and placed to external gravity drain bag. The patient tolerated the procedure well. COMPLICATIONS: None immediate FINDINGS: Twelve French drain catheter placed in the cul-de-sac collection returning thin greenish fluid, a sample sent for Gram stain and culture. Twelve French drain catheter placed into the right perihepatic gas and fluid collection, returning thin greenish fluid, a sample sent for Gram stain and culture. IMPRESSION: 1. Technically successful CT-guided pelvic abscess drain catheter placement. 2. Technically successful CT-guided perihepatic abscess drain catheter placement. Electronically Signed   By: Lucrezia Europe M.D.   On: 02/08/2017 16:28   Ct Image Guided Drainage By Percutaneous Catheter  Result Date: 02/08/2017 CLINICAL DATA:  Recent small bowel obstruction status post lysis of adhesions. Postop CTs demonstrates Fluid/gas collection arising from the right lower abdomen near the cecum, extending into the anterior mid abdomen, and superiorly along the right hepatic dome. Suspected extravasation of contrast into the collection, suggesting bowel perforation. This has progressed from the recent prior. Additional small to moderate cul-de-sac collection. EXAM: 1. CT GUIDED DRAINAGE OF PELVIC ABSCESS 2. CT GUIDED DRAINAGE OF RIGHT UPPER QUADRANT PERITONEAL ABSCESS ANESTHESIA/SEDATION: Intravenous Fentanyl and Versed were administered as conscious sedation during continuous monitoring of the patient's level of consciousness and physiological / cardiorespiratory status by the radiology RN, with a total moderate  sedation time of 20 minutes. PROCEDURE: The procedure, risks, benefits, and alternatives were explained to the patient. Questions regarding the procedure were encouraged and answered. The patient understands and consents to the procedure. Patient placed in left anterior oblique position on the CT gantry. Axial scans through abdomen pelvis obtained in the cul-de-sac and right upper quadrant perihepatic collections were localized. Appropriate skin entry sites were marked. The pelvic field was prepped with chlorhexidinein a sterile fashion, and a sterile drape was applied covering the operative field. A sterile gown and sterile gloves were used for the procedure. Local anesthesia was provided with 1% Lidocaine. Under CT fluoroscopic guidance, an 18 gauge trocar needle was advanced into the cul-de-sac loculated collection. Greenish fluid could be aspirated. An Amplatz guidewire advanced easily within the collection, its position confirmed on CT. Tract dilated to facilitate placement of a 12 French pigtail drain catheter, placed centrally within the collection. 60 mL of the greenish thin aspirate were sent for Gram stain, culture and sensitivity. Catheter was secured externally with 0 Prolene suture and StatLock and placed to external gravity drain bag. In similar fashion, using all new sterile equipment, the right upper abdominal field was prepped with chlorhexidinein a sterile fashion, and a sterile drape was applied covering the operative field. A sterile gown and sterile gloves were used for the procedure. Local anesthesia was provided with 1% Lidocaine. Under CT fluoroscopic guidance, an 18 gauge percutaneous entry needle was advanced into the loculated perihepatic collection. Greenish fluid could be aspirated. An Amplatz guidewire advanced easily within the collection over the dome of the liver, its position confirmed on CT. Tract dilated to facilitate placement of a 12 French pigtail drain catheter, placed  centrally within the collection. 30 mL of the greenish thin aspirate were sent for Gram stain, culture and sensitivity. Catheter was secured externally with 0  Prolene suture and StatLock and placed to external gravity drain bag. The patient tolerated the procedure well. COMPLICATIONS: None immediate FINDINGS: Twelve French drain catheter placed in the cul-de-sac collection returning thin greenish fluid, a sample sent for Gram stain and culture. Twelve French drain catheter placed into the right perihepatic gas and fluid collection, returning thin greenish fluid, a sample sent for Gram stain and culture. IMPRESSION: 1. Technically successful CT-guided pelvic abscess drain catheter placement. 2. Technically successful CT-guided perihepatic abscess drain catheter placement. Electronically Signed   By: Lucrezia Europe M.D.   On: 02/08/2017 16:28    Anti-infectives: Anti-infectives    Start     Dose/Rate Route Frequency Ordered Stop   02/06/17 1700  piperacillin-tazobactam (ZOSYN) IVPB 3.375 g     3.375 g 12.5 mL/hr over 240 Minutes Intravenous Every 8 hours 02/06/17 1629     02/02/17 0900  cefTRIAXone (ROCEPHIN) 2 g in dextrose 5 % 50 mL IVPB     2 g 100 mL/hr over 30 Minutes Intravenous  Once 02/25/2017 1429 02/02/17 0930   02/24/2017 1730  metroNIDAZOLE (FLAGYL) IVPB 500 mg     500 mg 100 mL/hr over 60 Minutes Intravenous Every 8 hours 02/18/2017 1429 02/02/17 0325   02/17/2017 0822  cefTRIAXone (ROCEPHIN) 2 g in dextrose 5 % 50 mL IVPB     2 g 100 mL/hr over 30 Minutes Intravenous On call to O.R. 02/22/2017 3729 01/28/2017 0920   02/09/2017 0211  metroNIDAZOLE (FLAGYL) IVPB 500 mg  Status:  Discontinued     500 mg 100 mL/hr over 60 Minutes Intravenous Every 8 hours 02/07/2017 0822 02/12/2017 1330      Assessment/Plan: s/p Procedure(s): EXPLORATORY LAPAROTOMY LYSIS OF ADHESION Impression: Possible fascial dehiscence. No need for urgent operative intervention as skin is intact. We'll continue to monitor. Continue  current therapy. Patient needs to remain in bed at this time.  LOS: 17 days    Aviva Signs 02/10/2017

## 2017-02-10 NOTE — Progress Notes (Signed)
During report this morning, received phone call from nurse tech, Joshua Downs, patient's daughter, Joshua Downs, had attempted to transfer patient from bedside commode to bed without staff approval and without notifying staff.  On arrival to patient's room, there were two nurse techs, Vita Barley and myself beginning to emergently assist the clean up of Joshua Downs.  There was a Downs amount of bile leakage coming from the abdominal incision.  We were all working together urgently, as we did not know if this wound had dehisced.  Daughter, Joshua Downs, was pacing back and forth from patient's bedside to nursing desk, stating that the care for her father was not good enough.  After cleaning the patient, dressing the wound, and calling both attending MD, Joshua Downs, and Dr. Arnoldo Morale, an attempt was made to explain to Ms. Joshua Downs how very important it was for the patient to use bedpan, or to allow the nursing staff to handle and assist patient to bedside commode.  Ms. 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  Joshua Downs came to the bedside and the patient's son, Joshua Downs had arrived.  With all five parties present, which include the patient, Ms. Joshua Downs, Joshua Downs, Joshua Downs, and [name], RN, Joshua Downs began to address every concern that Ms. Joshua Downs had in regards to the complaints concerning the care of her father.  All of these concerns were addressed by Joshua Downs, and the plan of care was also discussed.  Ms. Joshua Downs was explained several times the importance of allowing only nursing staff to touch all equipment, to include IV settings, suction tubing, drain bags, and ambulating the patient to the restroom.

## 2017-02-10 NOTE — Progress Notes (Signed)
PROGRESS NOTE    Joshua Downs  EXN:[id number] DOB: 03-Oct-1931 DOA: 01/21/2017 PCP: Deloria Lair, MD    Brief Narrative: 83 year old man initially admitted to the hospital on 3/28 due to abdominal pain and found to have a small bowel obstruction. CT scan of the abdomen showed a high-grade small bowel obstruction with transition point in the mid abdomen. Was also noted to have acute kidney injury. NG tube was placed in the emergency department, surgical services were consulted. NG tube was initially removed on 3/30 and was reinserted on the same day due to recurrence of distention. NG tube was again removed on 4/3 due to patient's symptoms improving, unfortunately he has failed again with increased vomiting and NG tube has been replaced on 4/4. OR on 4/5 for ex lap with lysis of adhesions. Very slow to progress post op. Still with abd pain and distention. CT showed extravasatioin of contrast concerning for bowel perforation. He had IR placement of drainage tube, and is doing much better today. He is on TPN. Event this am noted.  He has green bilous drainage at the umbilicus, but his wound is intact.    Assessment & Plan:   Principal Problem:   Small bowel obstruction (HCC) Active Problems:   Acute renal failure (ARF) (HCC)   Hyperglycemia   Protein-calorie malnutrition, severe   Malnutrition of moderate degree  Small bowel obstruction -He has failed conservative management and went to OR on 4/5 for ex lap and lysis of adhesions. -Follow surgical recommendations: keep NG in today. He is still having significant abdominal distention and hypertympani.  -Continue TPN. -CT scan 4/9with large amount of free air but no leakage of contrast. D/W Dr. Arnoldo Morale plan to redo CT and after that recommendations will follow. -Repeat CT 4/10 with extraluminal fluid/gas with extravasation of contrast concerning for bowel perforation. Dr. Arnoldo Morale aware. IR has placed drainage tube on April 12.  Doing better.  Continue with Zosyn. No fever, or leukocytosys. -Still has not had return of bowel function. - Bile drainage thru incision, Dr Arnoldo Morale aware, will see patient today.   Severe protein caloric malnutrition -Continue TPN. -Appreciate dietitian and pharmacy recommendations.  Hypernatremia -Resolved.  Hypertension -By mouth meds on hold given nothing by mouth state. -BP remains low normal.  Acute on CKD Stage III -Baseline Cr aroung 1.1-1.2. -Cr maxed at2.12. -down to 1.75 on 4/10. Now at 1.4  Wide Complex Tachycardia -Discussed via phone with cards, Dr. Meda Coffee and also with Dr. Bronson Ing. -Appears to be simply sinus tach with a wide QRS due to his RBBB. -After fluid bolus, HR has returned to 80s-90s.  DVT prophylaxis:Lovenox Code Status:Full code Family Communication:Sons at bedside updated on plan of care and all questions answered on 4/9. Disposition Plan:To be determined pending medical stability.  Consultants:  Surgery.   Procedures: Exp Lap with lysis of adhesion.   Antimicrobials: Anti-infectives    Start     Dose/Rate Route Frequency Ordered Stop   02/06/17 1700  piperacillin-tazobactam (ZOSYN) IVPB 3.375 g     3.375 g 12.5 mL/hr over 240 Minutes Intravenous Every 8 hours 02/06/17 1629     02/02/17 0900  cefTRIAXone (ROCEPHIN) 2 g in dextrose 5 % 50 mL IVPB     2 g 100 mL/hr over 30 Minutes Intravenous  Once 01/29/2017 1429 02/02/17 0930   01/30/2017 1730  metroNIDAZOLE (FLAGYL) IVPB 500 mg     500 mg 100 mL/hr over 60 Minutes Intravenous Every 8 hours 02/10/2017 1429 02/02/17 0325  02/25/2017 0822  cefTRIAXone (ROCEPHIN) 2 g in dextrose 5 % 50 mL IVPB     2 g 100 mL/hr over 30 Minutes Intravenous On call to O.R. 02/11/2017 4825 02/16/2017 0920   01/31/2017 0822  metroNIDAZOLE (FLAGYL) IVPB 500 mg  Status:  Discontinued     500 mg 100 mL/hr over 60 Minutes Intravenous Every 8 hours 02/16/2017 0822 02/15/2017 1330       Subjective:  He has no complaints.  Abd feels better.   Objective: Vitals:   02/08/17 1534 02/08/17 2135 02/09/17 2200 02/10/17 0620  BP: 113/76 115/65 117/72 120/68  Pulse: (!) 109 (!) 111 (!) 113 (!) 102  Resp:  20 20 20   Temp:  98.7 F (37.1 C) 98.8 F (37.1 C) 98.8 F (37.1 C)  TempSrc:  Axillary Oral Oral  SpO2: 98% 99% 91% 93%  Weight:      Height:        Intake/Output Summary (Last 24 hours) at 02/10/17 0938 Last data filed at 02/10/17 0037  Gross per 24 hour  Intake          4446.83 ml  Output             2050 ml  Net          2396.83 ml   Filed Weights   01/06/2017 1204 01/25/2017 1807  Weight: 76.2 kg (168 lb) 73.2 kg (161 lb 6.4 oz)    Examination:  General exam: Appears calm and comfortable  Respiratory system: Clear to auscultation. Respiratory effort normal. Cardiovascular system: S1 & S2 heard, RRR. No JVD, murmurs, rubs, gallops or clicks. No pedal edema. Gastrointestinal system: Abdomen is nondistended, soft and nontender. No organomegaly or masses felt. He has bile drainage thru the abdominal wall.  Central nervous system: Alert and oriented. No focal neurological deficits. Extremities: Symmetric 5 x 5 power. Skin: No rashes, lesions or ulcers Psychiatry: Judgement and insight appear normal. Mood & affect appropriate.   Data Reviewed: I have personally reviewed following labs and imaging studies  CBC:  Recent Labs Lab 02/05/17 0414 02/06/17 0414 02/07/17 0410  WBC 6.5 7.4 7.3  NEUTROABS 5.3  --  6.1  HGB 12.9* 12.9* 11.5*  HCT 38.5* 38.9* 33.9*  MCV 85.6 85.9 85.2  PLT 253 218 048   Basic Metabolic Panel:  Recent Labs Lab 02/05/17 0414 02/06/17 0414 02/07/17 0410 02/08/17 0530 02/09/17 0936 02/10/17 0846  NA 135 136 139 143 146* 147*  K 4.2 3.7 3.4* 3.2* 3.2* 3.9  CL 97* 95* 99* 101 100* 104  CO2 28 32 33* 36* 40* 35*  GLUCOSE 131* 156* 165* 165* 133* 162*  BUN 63* 69* 72* 64* 56* 60*  CREATININE 2.12* 1.75* 1.49* 1.33* 1.38* 1.54*  CALCIUM 7.9* 7.6* 7.5* 7.6* 7.6*  7.6*  MG 1.9 1.9  --  2.1  --   --   PHOS 4.0 4.6  --  4.7*  --   --    GFR: Estimated Creatinine Clearance: 37 mL/min (A) (by C-G formula based on SCr of 1.54 mg/dL (H)). Liver Function Tests:  Recent Labs Lab 02/05/17 0414 02/07/17 0410 02/08/17 0530  AST 20 26 35  ALT 15* 20 24  ALKPHOS 31* 49 88  BILITOT 0.5 0.8 1.6*  PROT 5.2* 4.7* 5.0*  ALBUMIN 1.7* 1.4* 1.4*   Coagulation Profile:  Recent Labs Lab 02/07/17 0410  INR 1.23   CBG:  Recent Labs Lab 02/09/17 1128 02/09/17 1340 02/09/17 1636 02/10/17 0014 02/10/17 0624  GLUCAP  136* 145* 126* 179* 146*    Recent Results (from the past 240 hour(s))  Surgical pcr screen     Status: Abnormal   Collection Time: 01/31/17 11:46 PM  Result Value Ref Range Status   MRSA, PCR NEGATIVE NEGATIVE Final   Staphylococcus aureus POSITIVE (A) NEGATIVE Final    Comment:        The Xpert SA Assay (FDA approved for NASAL specimens in patients over 61 years of age), is one component of a comprehensive surveillance program.  Test performance has been validated by [facility] for patients greater than or equal to 55 year old. It is not intended to diagnose infection nor to guide or monitor treatment. RESULT CALLED TO, READ BACK BY AND VERIFIED WITH:  TETREAULT,H @ 0150 ON 02/05/2017 BY JUW   Aerobic/Anaerobic Culture (surgical/deep wound)     Status: None (Preliminary result)   Collection Time: 02/08/17  2:10 PM  Result Value Ref Range Status   Specimen Description ABSCESS  Final   Special Requests TRANSGLUTEAL DRAIN  Final   Gram Stain   Final    FEW WBC PRESENT, PREDOMINANTLY PMN NO ORGANISMS SEEN    Culture NO GROWTH < 24 HOURS  Final   Report Status PENDING  Incomplete  Aerobic/Anaerobic Culture (surgical/deep wound)     Status: None (Preliminary result)   Collection Time: 02/08/17  2:11 PM  Result Value Ref Range Status   Specimen Description ABSCESS  Final   Special Requests RUQ PERIHEPATIC DRAIN  Final   Gram  Stain   Final    ABUNDANT WBC PRESENT, PREDOMINANTLY PMN NO ORGANISMS SEEN    Culture CULTURE REINCUBATED FOR BETTER GROWTH  Final   Report Status PENDING  Incomplete     Radiology Studies: Ct Image Guided Drainage By Percutaneous Catheter  Result Date: 02/08/2017 CLINICAL DATA:  Recent small bowel obstruction status post lysis of adhesions. Postop CTs demonstrates Fluid/gas collection arising from the right lower abdomen near the cecum, extending into the anterior mid abdomen, and superiorly along the right hepatic dome. Suspected extravasation of contrast into the collection, suggesting bowel perforation. This has progressed from the recent prior. Additional small to moderate cul-de-sac collection. EXAM: 1. CT GUIDED DRAINAGE OF PELVIC ABSCESS 2. CT GUIDED DRAINAGE OF RIGHT UPPER QUADRANT PERITONEAL ABSCESS ANESTHESIA/SEDATION: Intravenous Fentanyl and Versed were administered as conscious sedation during continuous monitoring of the patient's level of consciousness and physiological / cardiorespiratory status by the radiology RN, with a total moderate sedation time of 20 minutes. PROCEDURE: The procedure, risks, benefits, and alternatives were explained to the patient. Questions regarding the procedure were encouraged and answered. The patient understands and consents to the procedure. Patient placed in left anterior oblique position on the CT gantry. Axial scans through abdomen pelvis obtained in the cul-de-sac and right upper quadrant perihepatic collections were localized. Appropriate skin entry sites were marked. The pelvic field was prepped with chlorhexidinein a sterile fashion, and a sterile drape was applied covering the operative field. A sterile gown and sterile gloves were used for the procedure. Local anesthesia was provided with 1% Lidocaine. Under CT fluoroscopic guidance, an 18 gauge trocar needle was advanced into the cul-de-sac loculated collection. Greenish fluid could be aspirated.  An Amplatz guidewire advanced easily within the collection, its position confirmed on CT. Tract dilated to facilitate placement of a 12 French pigtail drain catheter, placed centrally within the collection. 60 mL of the greenish thin aspirate were sent for Gram stain, culture and sensitivity. Catheter was secured  externally with 0 Prolene suture and StatLock and placed to external gravity drain bag. In similar fashion, using all new sterile equipment, the right upper abdominal field was prepped with chlorhexidinein a sterile fashion, and a sterile drape was applied covering the operative field. A sterile gown and sterile gloves were used for the procedure. Local anesthesia was provided with 1% Lidocaine. Under CT fluoroscopic guidance, an 18 gauge percutaneous entry needle was advanced into the loculated perihepatic collection. Greenish fluid could be aspirated. An Amplatz guidewire advanced easily within the collection over the dome of the liver, its position confirmed on CT. Tract dilated to facilitate placement of a 12 French pigtail drain catheter, placed centrally within the collection. 30 mL of the greenish thin aspirate were sent for Gram stain, culture and sensitivity. Catheter was secured externally with 0 Prolene suture and StatLock and placed to external gravity drain bag. The patient tolerated the procedure well. COMPLICATIONS: None immediate FINDINGS: Twelve French drain catheter placed in the cul-de-sac collection returning thin greenish fluid, a sample sent for Gram stain and culture. Twelve French drain catheter placed into the right perihepatic gas and fluid collection, returning thin greenish fluid, a sample sent for Gram stain and culture. IMPRESSION: 1. Technically successful CT-guided pelvic abscess drain catheter placement. 2. Technically successful CT-guided perihepatic abscess drain catheter placement. Electronically Signed   By: Lucrezia Europe M.D.   On: 02/08/2017 16:28   Ct Image Guided  Drainage By Percutaneous Catheter  Result Date: 02/08/2017 CLINICAL DATA:  Recent small bowel obstruction status post lysis of adhesions. Postop CTs demonstrates Fluid/gas collection arising from the right lower abdomen near the cecum, extending into the anterior mid abdomen, and superiorly along the right hepatic dome. Suspected extravasation of contrast into the collection, suggesting bowel perforation. This has progressed from the recent prior. Additional small to moderate cul-de-sac collection. EXAM: 1. CT GUIDED DRAINAGE OF PELVIC ABSCESS 2. CT GUIDED DRAINAGE OF RIGHT UPPER QUADRANT PERITONEAL ABSCESS ANESTHESIA/SEDATION: Intravenous Fentanyl and Versed were administered as conscious sedation during continuous monitoring of the patient's level of consciousness and physiological / cardiorespiratory status by the radiology RN, with a total moderate sedation time of 20 minutes. PROCEDURE: The procedure, risks, benefits, and alternatives were explained to the patient. Questions regarding the procedure were encouraged and answered. The patient understands and consents to the procedure. Patient placed in left anterior oblique position on the CT gantry. Axial scans through abdomen pelvis obtained in the cul-de-sac and right upper quadrant perihepatic collections were localized. Appropriate skin entry sites were marked. The pelvic field was prepped with chlorhexidinein a sterile fashion, and a sterile drape was applied covering the operative field. A sterile gown and sterile gloves were used for the procedure. Local anesthesia was provided with 1% Lidocaine. Under CT fluoroscopic guidance, an 18 gauge trocar needle was advanced into the cul-de-sac loculated collection. Greenish fluid could be aspirated. An Amplatz guidewire advanced easily within the collection, its position confirmed on CT. Tract dilated to facilitate placement of a 12 French pigtail drain catheter, placed centrally within the collection. 60 mL of  the greenish thin aspirate were sent for Gram stain, culture and sensitivity. Catheter was secured externally with 0 Prolene suture and StatLock and placed to external gravity drain bag. In similar fashion, using all new sterile equipment, the right upper abdominal field was prepped with chlorhexidinein a sterile fashion, and a sterile drape was applied covering the operative field. A sterile gown and sterile gloves were used for the procedure. Local anesthesia was  provided with 1% Lidocaine. Under CT fluoroscopic guidance, an 18 gauge percutaneous entry needle was advanced into the loculated perihepatic collection. Greenish fluid could be aspirated. An Amplatz guidewire advanced easily within the collection over the dome of the liver, its position confirmed on CT. Tract dilated to facilitate placement of a 12 French pigtail drain catheter, placed centrally within the collection. 30 mL of the greenish thin aspirate were sent for Gram stain, culture and sensitivity. Catheter was secured externally with 0 Prolene suture and StatLock and placed to external gravity drain bag. The patient tolerated the procedure well. COMPLICATIONS: None immediate FINDINGS: Twelve French drain catheter placed in the cul-de-sac collection returning thin greenish fluid, a sample sent for Gram stain and culture. Twelve French drain catheter placed into the right perihepatic gas and fluid collection, returning thin greenish fluid, a sample sent for Gram stain and culture. IMPRESSION: 1. Technically successful CT-guided pelvic abscess drain catheter placement. 2. Technically successful CT-guided perihepatic abscess drain catheter placement. Electronically Signed   By: Lucrezia Europe M.D.   On: 02/08/2017 16:28    Scheduled Meds: . diclofenac sodium  2 g Topical Daily  . insulin aspart  0-15 Units Subcutaneous Q8H  . LORazepam  0.5 mg Intravenous QHS  . piperacillin-tazobactam (ZOSYN)  IV  3.375 g Intravenous Q8H  . sodium chloride  500 mL  Intravenous Once   Continuous Infusions: . Marland KitchenTPN (CLINIMIX-E) Adult 80 mL/hr (02/09/17 1800)  . sodium chloride 125 mL/hr at 02/09/17 1647     LOS: 35 days   Gianpaolo Mindel, MD [facility].   If 7PM-7AM, please contact night-coverage [email or web address] Password [facility] 02/10/2017, 9:38 AM

## 2017-02-10 NOTE — Progress Notes (Signed)
PHARMACY - ADULT TOTAL PARENTERAL NUTRITION CONSULT NOTE   Pharmacy Consult for TPN Indication: Bowel obstruction, surgery 4/5  Patient Measurements: Height: 6' (182.9 cm) Weight: 161 lb 6.4 oz (73.2 kg) IBW/kg (Calculated) : 77.6 TPN AdjBW (KG): 73.2 Body mass index is 21.89 kg/m.  Assessment: 83 year old male with history of hypertension, remote left kidney carcinoma status post partial nephrectomy, chronic bradycardia, recent back surgery who presented with abdominal pain for past 3 days. Denied any fevers, chills, nausea or vomiting. Denied dysuria or diarrhea.  Pt had surgery on 02/18/2017 for SBO. Patient has still not had return of bowel function. NG tube was replaced, CT scan of abdomen + for bowel perforation.   IR placed drainage tube 4/12.   Glucose control remains elevated with BS 126-162 (5 units SSI last 24 hours).  SCr slight increase. Ca++ corrects to normal when accounting for low albumin.  GI: Pt had gone approximately 11 days with minimal to no nutrition. At this time, TPN warranted as patient showed intolerance to clear liquids.    Insulin requirements in the past 24 hours: none BMP Latest Ref Rng & Units 02/10/2017 02/09/2017 02/08/2017  Glucose 65 - 99 mg/dL 162(H) 133(H) 165(H)  BUN 6 - 20 mg/dL 60(H) 56(H) 64(H)  Creatinine 0.61 - 1.24 mg/dL 1.54(H) 1.38(H) 1.33(H)  Sodium 135 - 145 mmol/L 147(H) 146(H) 143  Potassium 3.5 - 5.1 mmol/L 3.9 3.2(L) 3.2(L)  Chloride 101 - 111 mmol/L 104 100(L) 101  CO2 22 - 32 mmol/L 35(H) 40(H) 36(H)  Calcium 8.9 - 10.3 mg/dL 7.6(L) 7.6(L) 7.6(L)   TPN Access: central IV access TPN start date: 01/31/17 Current Nutrition: none  Nutritional Goals (per RD recommendation on 01/30/2017): Estimated Nutritional Needs:  Kcal:  2050-2250 (28-31 kcal/kg bw) Protein:  88-102 g Pro (1.2-1.4 g/kg bw) Fluid:  >1.8 L (25 ml/kg bw)  Plan:  TPN rate at goal  Continue Clinimix E 5/15 at 77ml/hr  20% lipid emulsion at 7ml/hr (infuse over 12  hours)  Maintenance IV fluids per MD  TPN provides 96 g of protein and total calories: 1843 kCals per day   MVI and trace elements in TPN  Continue Regular Insulin 20 units/bag  Continue SSI q8h CBG checks  Monitor TPN labs, renal fxn, fluid balance, glucose tolerance  Abner Greenspan, Jeffree Cazeau [place], [facility]  02/10/2017,10:29 AM

## 2017-02-10 NOTE — Progress Notes (Signed)
Notified by Tele patient's heart rate was in the 150s. Went to check patient and daughter was waking patient up/ Will continue to monitor patient.

## 2017-02-10 NOTE — Progress Notes (Signed)
Daughter called to inquire about father's progress. Returned her phone call. Pt currently asleep.

## 2017-02-10 NOTE — Progress Notes (Signed)
Late Entry: Pt's daughter arrived on the unit at 5:45 and attempted to wake patient. There was a phone call from central telemetry in regards to the patient's heart rate going up to the 150s. Tech went into the patient's room for morning rounds to take vital signs and toilet patient. Pt had an incontinent episode of urine and tech cleaned pt up.   At approximately 6:40, patient's daughter, Manuela Schwartz, was in the room and put the call on light on stating patient needed to use the restroom. Manuela Schwartz was getting the patient up out of bed. Manuela Schwartz had patient raising up to sit on the side of the bed and was moving the i.v pole to the patient's right side of the bed as well as messing with equipment at the bedside (i.e. Suction). Writer, (nurse) explained to daughter that this was not safe and that the patient needs to use the bedpan. Nurse immediately went and retreived the bedpan and phoned nurse tech for assistance.   Nurse and tech were at the bedside. Pt's daughter had pulled the bedside commode out of the bathroom, moved patient's iv pole, and was attempting the patient up herself. The pt was splinted and transitioned to the bedside commode as a two person assist. The pt was fine and daughter requested that staff give him some privacy for toileting. Daughter stated she was going to stay at the bedside and would call when pt was finished having a bowel movement. Daughter Manuela Schwartz stated she worked here at the hospital and would call and sit with her daddy. She stated she was not leaving the room and would call as she had called before.  Nurse was in another patient's room giving report. Another patient requested coffee during the report and writer went to obtain the coffee. Coffee was given to the patient that requested this and report completed. As shift report began to continue nurse was notified by nursing staff that patient's wound started draining green and serosanguineous fluid and was on floor. Nursing staff  attended to patient by applying pressure and attempting to stop the leaking and making sure patient was safely back in bed.   Patient's vitals were taken and pt was assessed. Surgeon was notified, MD was notified, and MD came to the floor. Pt currently stable and care was transitioned.

## 2017-02-11 LAB — BASIC METABOLIC PANEL
ANION GAP: 6 (ref 5–15)
BUN: 53 mg/dL — ABNORMAL HIGH (ref 6–20)
CHLORIDE: 106 mmol/L (ref 101–111)
CO2: 36 mmol/L — ABNORMAL HIGH (ref 22–32)
CREATININE: 1.39 mg/dL — AB (ref 0.61–1.24)
Calcium: 7.5 mg/dL — ABNORMAL LOW (ref 8.9–10.3)
GFR calc non Af Amer: 45 mL/min — ABNORMAL LOW (ref >60)
GFR, EST AFRICAN AMERICAN: 52 mL/min — AB (ref >60)
Glucose, Bld: 172 mg/dL — ABNORMAL HIGH (ref 65–99)
Potassium: 3.6 mmol/L (ref 3.5–5.1)
Sodium: 148 mmol/L — ABNORMAL HIGH (ref 135–145)

## 2017-02-11 LAB — GLUCOSE, CAPILLARY
GLUCOSE-CAPILLARY: 150 mg/dL — AB (ref 65–99)
Glucose-Capillary: 155 mg/dL — ABNORMAL HIGH (ref 65–99)

## 2017-02-11 MED ORDER — SODIUM CHLORIDE 0.9% FLUSH
5.0000 mL | Freq: Three times a day (TID) | INTRAVENOUS | Status: DC
  Administered 2017-02-11 – 2017-02-13 (×9): 5 mL

## 2017-02-11 MED ORDER — TRACE MINERALS CR-CU-MN-SE-ZN 10-1000-500-60 MCG/ML IV SOLN
80.0000 mL/h | INTRAVENOUS | Status: AC
  Administered 2017-02-11: 19:00:00 via INTRAVENOUS
  Filled 2017-02-11: qty 1920

## 2017-02-11 MED ORDER — SODIUM CHLORIDE 0.9% FLUSH: 5.0000 mL | Freq: Three times a day (TID) | INTRAVENOUS | Status: DC

## 2017-02-11 MED ORDER — FAT EMULSION 20 % IV EMUL
250.0000 mL | INTRAVENOUS | Status: AC
  Administered 2017-02-11: 250 mL via INTRAVENOUS
  Filled 2017-02-11: qty 250

## 2017-02-11 NOTE — Progress Notes (Signed)
10 Days Post-Op  Subjective: No new complaints.  Objective: Vital signs in last 24 hours: Temp:  [97.4 F (36.3 C)-98.9 F (37.2 C)] 98.5 F (36.9 C) (04/15 0647) Pulse Rate:  [100-124] 124 (04/15 0647) Resp:  [20] 20 (04/15 0647) BP: (107-129)/(71-78) 129/78 (04/15 0647) SpO2:  [98 %-100 %] 100 % (04/15 0647) Last BM Date: 02/08/17  Intake/Output from previous day: 04/14 0701 - 04/15 0700 In: 5050.7 [I.V.:4940.7; IV Piggyback:100] Out: 1914 [Urine:450; Emesis/NG output:300; Drains:240; Stool:700] Intake/Output this shift: No intake/output data recorded.  General appearance: alert, cooperative and no distress GI: Soft. Incision line intact. There is a fascial dehiscence with some clear green drainage noted.  Lab Results:  No results for input(s): WBC, HGB, HCT, PLT in the last 72 hours. BMET  Recent Labs  02/10/17 0846 02/11/17 0645  NA 147* 148*  K 3.9 3.6  CL 104 106  CO2 35* 36*  GLUCOSE 162* 172*  BUN 60* 53*  CREATININE 1.54* 1.39*  CALCIUM 7.6* 7.5*   PT/INR No results for input(s): LABPROT, INR in the last 72 hours.  Studies/Results: No results found.  Anti-infectives: Anti-infectives    Start     Dose/Rate Route Frequency Ordered Stop   02/06/17 1700  piperacillin-tazobactam (ZOSYN) IVPB 3.375 g     3.375 g 12.5 mL/hr over 240 Minutes Intravenous Every 8 hours 02/06/17 1629     02/02/17 0900  cefTRIAXone (ROCEPHIN) 2 g in dextrose 5 % 50 mL IVPB     2 g 100 mL/hr over 30 Minutes Intravenous  Once 02/23/2017 1429 02/02/17 0930   02/04/2017 1730  metroNIDAZOLE (FLAGYL) IVPB 500 mg     500 mg 100 mL/hr over 60 Minutes Intravenous Every 8 hours 02/18/2017 1429 02/02/17 0325   02/11/2017 0822  cefTRIAXone (ROCEPHIN) 2 g in dextrose 5 % 50 mL IVPB     2 g 100 mL/hr over 30 Minutes Intravenous On call to O.R. 01/29/2017 7829 02/09/2017 0920   02/02/2017 5621  metroNIDAZOLE (FLAGYL) IVPB 500 mg  Status:  Discontinued     500 mg 100 mL/hr over 60 Minutes Intravenous  Every 8 hours 02/17/2017 0822 02/10/2017 1330      Assessment/Plan: s/p Procedure(s): EXPLORATORY LAPAROTOMY LYSIS OF ADHESION Impression: Bowel leak with fascial dehiscence, but skin intact.  Plan: Continue current therapy. I would like for him to be about 2 weeks prior to any reexploration. This has been explained to the patient and family, who understand and agree. I will temporarily put him on the surgery schedule for an exploratory laparotomy on 01/28/2017.  LOS: 18 days    Aviva Signs 02/11/2017

## 2017-02-11 NOTE — Progress Notes (Signed)
PHARMACY - ADULT TOTAL PARENTERAL NUTRITION CONSULT NOTE   Pharmacy Consult for TPN Indication: Bowel obstruction, surgery 4/5  Patient Measurements: Height: 6' (182.9 cm) Weight: 161 lb 6.4 oz (73.2 kg) IBW/kg (Calculated) : 77.6 TPN AdjBW (KG): 73.2 Body mass index is 21.89 kg/m.  Assessment: 83 year old male with history of hypertension, remote left kidney carcinoma status post partial nephrectomy, chronic bradycardia, recent back surgery who presented with abdominal pain for past 3 days. Denied any fevers, chills, nausea or vomiting. Denied dysuria or diarrhea.  Pt had surgery on 02/26/2017 for SBO. Patient has still not had return of bowel function. NG tube was replaced, CT scan of abdomen + for bowel perforation.   IR placed drainage tube 4/12.   Glucose control remains elevated with BS 131-172 (4 units SSI last 24 hours).  SCr slight improvement. Ca++ corrects to normal when accounting for low albumin.  GI: Pt had gone approximately 11 days with minimal to no nutrition. At this time, TPN warranted as patient showed intolerance to clear liquids.    Insulin requirements in the past 24 hours: none BMP Latest Ref Rng & Units 02/11/2017 02/10/2017 02/09/2017  Glucose 65 - 99 mg/dL 172(H) 162(H) 133(H)  BUN 6 - 20 mg/dL 53(H) 60(H) 56(H)  Creatinine 0.61 - 1.24 mg/dL 1.39(H) 1.54(H) 1.38(H)  Sodium 135 - 145 mmol/L 148(H) 147(H) 146(H)  Potassium 3.5 - 5.1 mmol/L 3.6 3.9 3.2(L)  Chloride 101 - 111 mmol/L 106 104 100(L)  CO2 22 - 32 mmol/L 36(H) 35(H) 40(H)  Calcium 8.9 - 10.3 mg/dL 7.5(L) 7.6(L) 7.6(L)   TPN Access: central IV access TPN start date: 01/31/17 Current Nutrition: none  Nutritional Goals (per RD recommendation on 01/30/2017): Estimated Nutritional Needs:  Kcal:  2050-2250 (28-31 kcal/kg bw) Protein:  88-102 g Pro (1.2-1.4 g/kg bw) Fluid:  >1.8 L (25 ml/kg bw)  Plan:  TPN rate at goal  Continue Clinimix E 5/15 at 58ml/hr  20% lipid emulsion at 69ml/hr (infuse over 12  hours)  Maintenance IV fluids per MD  TPN provides 96 g of protein and total calories: 1843 kCals per day   MVI and trace elements in TPN  Increase  Regular Insulin to 25 units/bag due to continuing elevated glucose  Continue SSI q8h CBG checks  Monitor TPN labs, renal fxn, fluid balance, glucose tolerance  Joshua Downs Joshua Downs [place], [facility]  02/11/2017,9:49 AM

## 2017-02-11 NOTE — Progress Notes (Signed)
PROGRESS NOTE    Joshua Downs  YOV:[id number] DOB: 07-Apr-1931 DOA: 01/22/2017 PCP: Deloria Lair, MD   Brief Narrative: 83 year old man initially admitted to the hospital on 3/28 due to abdominal pain and found to have a small bowel obstruction. CT scan of the abdomen showed a high-grade small bowel obstruction with transition point in the mid abdomen. Was also noted to have acute kidney injury. NG tube was placed in the emergency department, surgical services were consulted. NG tube was initially removed on 3/30 and was reinserted on the same day due to recurrence of distention. NG tube was again removed on 4/3 due to patient's symptoms improving, unfortunately he has failed again with increased vomiting and NG tube has been replaced on 4/4. OR on 4/5 for ex lap with lysis of adhesions. Very slow to progress post op. Still with abd pain and distention. CT showedextravasatioin of contrast concerning for bowel perforation. He had IR placement of drainage tube, and is doing much better today.He is on TPN. No new event.   He has green bilous drainage at the umbilicus, but his wound is intact.     Assessment & Plan:   Principal Problem:   Small bowel obstruction (HCC) Active Problems:   Acute renal failure (ARF) (HCC)   Hyperglycemia   Protein-calorie malnutrition, severe   Malnutrition of moderate degree  Small bowel obstruction -He has failed conservative management and went to OR on 4/5 for ex lap and lysis of adhesions. -Follow surgical recommendations: keep NG in today. He is still having significant abdominal distention and hypertympani.  -Continue TPN. -CT scan 4/9with large amount of free air but no leakage of contrast. D/W Dr. Arnoldo Morale plan to redo CT and after that recommendations will follow. -Repeat CT 4/10 with extraluminal fluid/gas with extravasation of contrast concerning for bowel perforation. Dr. Arnoldo Morale aware. IR has placed drainage tube on April 12. Doing better.  Continue with Zosyn. No fever, or leukocytosys. -Still has not had return of bowel function. - Bile drainage thru incision, Dr Arnoldo Morale aware.  He plan to perform exploratory again next Wednesday.   Severe protein caloric malnutrition -Continue TPN. -Appreciate dietitian and pharmacy recommendations.  Hypernatremia -Resolved.  Hypertension -By mouth meds on hold given nothing by mouth state. -BP remains low normal.  Acute on CKD Stage III -Baseline Cr aroung 1.1-1.2. -Cr maxed at2.12. -down to 1.75 on 4/10. Now at 1.4  Wide Complex Tachycardia -Discussed via phone with cards, Dr. Meda Coffee and also with Dr. Bronson Ing. -Appears to be simply sinus tach with a wide QRS due to his RBBB. -After fluid bolus, HR has returned to 80s-90s.  DVT prophylaxis:Lovenox Code Status:Full code Family Communication:Sons at bedside updated on plan of care and all questions answered on 4/9. Disposition Plan:To be determined pending medical stability.   Consultants:   Surgery.   Procedures:   Exploratory and release of adhesion.  Antimicrobials: Anti-infectives    Start     Dose/Rate Route Frequency Ordered Stop   02/06/17 1700  piperacillin-tazobactam (ZOSYN) IVPB 3.375 g     3.375 g 12.5 mL/hr over 240 Minutes Intravenous Every 8 hours 02/06/17 1629     02/02/17 0900  cefTRIAXone (ROCEPHIN) 2 g in dextrose 5 % 50 mL IVPB     2 g 100 mL/hr over 30 Minutes Intravenous  Once 02/12/2017 1429 02/02/17 0930   02/16/2017 1730  metroNIDAZOLE (FLAGYL) IVPB 500 mg     500 mg 100 mL/hr over 60 Minutes Intravenous Every 8 hours 02/20/2017 1429  02/02/17 0325   02/26/2017 0822  cefTRIAXone (ROCEPHIN) 2 g in dextrose 5 % 50 mL IVPB     2 g 100 mL/hr over 30 Minutes Intravenous On call to O.R. 01/30/2017 3086 02/24/2017 0920   02/13/2017 0822  metroNIDAZOLE (FLAGYL) IVPB 500 mg  Status:  Discontinued     500 mg 100 mL/hr over 60 Minutes Intravenous Every 8 hours 01/31/2017 0822 02/06/2017 1330        Subjective:  No complaints.   Objective: Vitals:   02/10/17 0620 02/10/17 1427 02/10/17 2150 02/11/17 0647  BP: 120/68 127/74 107/71 129/78  Pulse: (!) 102 100 (!) 106 (!) 124  Resp: 20 20 20 20   Temp: 98.8 F (37.1 C) 98.9 F (37.2 C) 97.4 F (36.3 C) 98.5 F (36.9 C)  TempSrc: Oral Oral Oral Oral  SpO2: 93% 99% 98% 100%  Weight:      Height:        Intake/Output Summary (Last 24 hours) at 02/11/17 1636 Last data filed at 02/11/17 0654  Gross per 24 hour  Intake          5050.67 ml  Output             1590 ml  Net          3460.67 ml   Filed Weights   01/26/2017 1204 01/27/2017 1807  Weight: 76.2 kg (168 lb) 73.2 kg (161 lb 6.4 oz)    Examination:  General exam: Appears calm and comfortable  Respiratory system: Clear to auscultation. Respiratory effort normal. Cardiovascular system: S1 & S2 heard, RRR. No JVD, murmurs, rubs, gallops or clicks. No pedal edema. Gastrointestinal system: Abdomen is nondistended, soft and nontender. No organomegaly or masses felt. Normal bowel sounds heard. Central nervous system: Alert and oriented. No focal neurological deficits. Extremities: Symmetric 5 x 5 power. Skin: some erythema at the incision site.  Less discharge from the wound.  Psychiatry: Judgement and insight appear normal. Mood & affect appropriate.   Data Reviewed: I have personally reviewed following labs and imaging studies  CBC:  Recent Labs Lab 02/05/17 0414 02/06/17 0414 02/07/17 0410  WBC 6.5 7.4 7.3  NEUTROABS 5.3  --  6.1  HGB 12.9* 12.9* 11.5*  HCT 38.5* 38.9* 33.9*  MCV 85.6 85.9 85.2  PLT 253 218 578   Basic Metabolic Panel:  Recent Labs Lab 02/05/17 0414 02/06/17 0414 02/07/17 0410 02/08/17 0530 02/09/17 0936 02/10/17 0846 02/11/17 0645  NA 135 136 139 143 146* 147* 148*  K 4.2 3.7 3.4* 3.2* 3.2* 3.9 3.6  CL 97* 95* 99* 101 100* 104 106  CO2 28 32 33* 36* 40* 35* 36*  GLUCOSE 131* 156* 165* 165* 133* 162* 172*  BUN 63* 69* 72* 64*  56* 60* 53*  CREATININE 2.12* 1.75* 1.49* 1.33* 1.38* 1.54* 1.39*  CALCIUM 7.9* 7.6* 7.5* 7.6* 7.6* 7.6* 7.5*  MG 1.9 1.9  --  2.1  --   --   --   PHOS 4.0 4.6  --  4.7*  --   --   --    GFR: Estimated Creatinine Clearance: 41 mL/min (A) (by C-G formula based on SCr of 1.39 mg/dL (H)). Liver Function Tests:  Recent Labs Lab 02/05/17 0414 02/07/17 0410 02/08/17 0530  AST 20 26 35  ALT 15* 20 24  ALKPHOS 31* 49 88  BILITOT 0.5 0.8 1.6*  PROT 5.2* 4.7* 5.0*  ALBUMIN 1.7* 1.4* 1.4*   Coagulation Profile:  Recent Labs Lab 02/07/17 0410  INR  1.23   CBG:  Recent Labs Lab 02/09/17 1636 02/10/17 0014 02/10/17 0624 02/10/17 2146 02/11/17 0643  GLUCAP 126* 179* 146* 131* 150*    Recent Results (from the past 240 hour(s))  Aerobic/Anaerobic Culture (surgical/deep wound)     Status: None (Preliminary result)   Collection Time: 02/08/17  2:10 PM  Result Value Ref Range Status   Specimen Description ABSCESS  Final   Special Requests TRANSGLUTEAL DRAIN  Final   Gram Stain   Final    FEW WBC PRESENT, PREDOMINANTLY PMN NO ORGANISMS SEEN    Culture   Final    NO GROWTH 2 DAYS NO ANAEROBES ISOLATED; CULTURE IN PROGRESS FOR 5 DAYS   Report Status PENDING  Incomplete  Aerobic/Anaerobic Culture (surgical/deep wound)     Status: Abnormal (Preliminary result)   Collection Time: 02/08/17  2:11 PM  Result Value Ref Range Status   Specimen Description ABSCESS  Final   Special Requests RUQ PERIHEPATIC DRAIN  Final   Gram Stain   Final    ABUNDANT WBC PRESENT, PREDOMINANTLY PMN NO ORGANISMS SEEN    Culture (A)  Final    MULTIPLE ORGANISMS PRESENT, NONE PREDOMINANT NO ANAEROBES ISOLATED; CULTURE IN PROGRESS FOR 5 DAYS    Report Status PENDING  Incomplete     Radiology Studies: No results found.  Scheduled Meds: . diclofenac sodium  2 g Topical Daily  . insulin aspart  0-15 Units Subcutaneous Q8H  . LORazepam  0.5 mg Intravenous QHS  . piperacillin-tazobactam (ZOSYN)  IV   3.375 g Intravenous Q8H  . sodium chloride  500 mL Intravenous Once  . sodium chloride flush  5 mL Intracatheter Q8H   Continuous Infusions: . Marland KitchenTPN (CLINIMIX-E) Adult 80 mL/hr (02/10/17 1752)  . Marland KitchenTPN (CLINIMIX-E) Adult     And  . fat emulsion    . sodium chloride 125 mL/hr at 02/10/17 1754     LOS: 18 days   Armandina Iman, MD [facility].   If 7PM-7AM, please contact night-coverage [email or web address] Password [facility] 02/11/2017, 4:36 PM

## 2017-02-11 NOTE — Progress Notes (Signed)
PT Cancellation Note  Patient Details Name: Joshua Downs MRN: [id number] DOB: 1931-02-12   Cancelled Treatment:    Reason Eval/Treat Not Completed: Other (comment) (Pt is now on bedrest per Dr. Arnoldo Morale note on 02/10/2017.  With plans for possible surgery 02/05/2017.  Will d/c PT orders at this time. Please re-consult when pt is able to participate in OOB activity.  Thanks!)   [name], PT, DPT X: [phone]

## 2017-02-12 LAB — COMPREHENSIVE METABOLIC PANEL
ALK PHOS: 131 U/L — AB (ref 38–126)
ALT: 38 U/L (ref 17–63)
AST: 43 U/L — AB (ref 15–41)
Albumin: 1.4 g/dL — ABNORMAL LOW (ref 3.5–5.0)
Anion gap: 6 (ref 5–15)
BILIRUBIN TOTAL: 1.2 mg/dL (ref 0.3–1.2)
BUN: 52 mg/dL — AB (ref 6–20)
CALCIUM: 7.6 mg/dL — AB (ref 8.9–10.3)
CO2: 37 mmol/L — ABNORMAL HIGH (ref 22–32)
Chloride: 105 mmol/L (ref 101–111)
Creatinine, Ser: 1.5 mg/dL — ABNORMAL HIGH (ref 0.61–1.24)
GFR calc Af Amer: 48 mL/min — ABNORMAL LOW (ref >60)
GFR calc non Af Amer: 41 mL/min — ABNORMAL LOW (ref >60)
GLUCOSE: 174 mg/dL — AB (ref 65–99)
Potassium: 3.6 mmol/L (ref 3.5–5.1)
Sodium: 148 mmol/L — ABNORMAL HIGH (ref 135–145)
TOTAL PROTEIN: 5.4 g/dL — AB (ref 6.5–8.1)

## 2017-02-12 LAB — DIFFERENTIAL
Basophils Absolute: 0 10*3/uL (ref 0.0–0.1)
Basophils Relative: 0 %
EOS PCT: 1 %
Eosinophils Absolute: 0.1 10*3/uL (ref 0.0–0.7)
LYMPHS PCT: 4 %
Lymphs Abs: 0.4 10*3/uL — ABNORMAL LOW (ref 0.7–4.0)
MONO ABS: 0.3 10*3/uL (ref 0.1–1.0)
MONOS PCT: 3 %
Neutro Abs: 9.7 10*3/uL — ABNORMAL HIGH (ref 1.7–7.7)
Neutrophils Relative %: 92 %

## 2017-02-12 LAB — CBC
HCT: 35.1 % — ABNORMAL LOW (ref 39.0–52.0)
Hemoglobin: 11.1 g/dL — ABNORMAL LOW (ref 13.0–17.0)
MCH: 28.3 pg (ref 26.0–34.0)
MCHC: 31.6 g/dL (ref 30.0–36.0)
MCV: 89.5 fL (ref 78.0–100.0)
PLATELETS: 321 10*3/uL (ref 150–400)
RBC: 3.92 MIL/uL — AB (ref 4.22–5.81)
RDW: 15.9 % — ABNORMAL HIGH (ref 11.5–15.5)
WBC: 10.6 10*3/uL — ABNORMAL HIGH (ref 4.0–10.5)

## 2017-02-12 LAB — PREALBUMIN: Prealbumin: 7.1 mg/dL — ABNORMAL LOW (ref 18–38)

## 2017-02-12 LAB — MAGNESIUM: Magnesium: 2.2 mg/dL (ref 1.7–2.4)

## 2017-02-12 LAB — TRIGLYCERIDES: TRIGLYCERIDES: 124 mg/dL (ref <150)

## 2017-02-12 LAB — GLUCOSE, CAPILLARY
GLUCOSE-CAPILLARY: 176 mg/dL — AB (ref 65–99)
Glucose-Capillary: 159 mg/dL — ABNORMAL HIGH (ref 65–99)
Glucose-Capillary: 160 mg/dL — ABNORMAL HIGH (ref 65–99)
Glucose-Capillary: 129 mg/dL — ABNORMAL HIGH (ref 65–99)
Glucose-Capillary: 119 mg/dL — ABNORMAL HIGH (ref 65–99)

## 2017-02-12 LAB — PHOSPHORUS: Phosphorus: 4.5 mg/dL (ref 2.5–4.6)

## 2017-02-12 MED ORDER — SODIUM CHLORIDE 0.9 % IV SOLN: Freq: Once | INTRAVENOUS | Status: DC

## 2017-02-12 MED ORDER — FAT EMULSION 20 % IV EMUL
250.0000 mL | INTRAVENOUS | Status: AC
  Administered 2017-02-12: 250 mL via INTRAVENOUS
  Filled 2017-02-12: qty 250

## 2017-02-12 MED ORDER — TRACE MINERALS CR-CU-MN-SE-ZN 10-1000-500-60 MCG/ML IV SOLN
40.0000 mL/h | INTRAVENOUS | Status: AC
  Administered 2017-02-12: 18:00:00 via INTRAVENOUS
  Filled 2017-02-12: qty 1920

## 2017-02-12 NOTE — Progress Notes (Signed)
11 Days Post-Op  Subjective: No significant change.  Objective: Vital signs in last 24 hours: Temp:  [98.3 F (36.8 C)-98.5 F (36.9 C)] 98.3 F (36.8 C) (04/16 0534) Pulse Rate:  [95-102] 95 (04/16 0534) Resp:  [20] 20 (04/15 2345) BP: (117-120)/(64-68) 120/68 (04/16 0534) SpO2:  [99 %-100 %] 99 % (04/16 0534) Last BM Date: 02/11/17  Intake/Output from previous day: 04/15 0701 - 04/16 0700 In: 4657 [I.V.:4332] Out: 1890 [Urine:1000; Emesis/NG output:400; Drains:490] Intake/Output this shift: No intake/output data recorded.  General appearance: alert, cooperative and no distress GI: Soft, dressing intact. Some yellowish green fluid present. No rigidity.  Lab Results:   Recent Labs  02/12/17 0425  WBC 10.6*  HGB 11.1*  HCT 35.1*  PLT 321   BMET  Recent Labs  02/11/17 0645 02/12/17 0425  NA 148* 148*  K 3.6 3.6  CL 106 105  CO2 36* 37*  GLUCOSE 172* 174*  BUN 53* 52*  CREATININE 1.39* 1.50*  CALCIUM 7.5* 7.6*   PT/INR No results for input(s): LABPROT, INR in the last 72 hours.  Studies/Results: No results found.  Anti-infectives: Anti-infectives    Start     Dose/Rate Route Frequency Ordered Stop   02/06/17 1700  piperacillin-tazobactam (ZOSYN) IVPB 3.375 g     3.375 g 12.5 mL/hr over 240 Minutes Intravenous Every 8 hours 02/06/17 1629     02/02/17 0900  cefTRIAXone (ROCEPHIN) 2 g in dextrose 5 % 50 mL IVPB     2 g 100 mL/hr over 30 Minutes Intravenous  Once 02/02/2017 1429 02/02/17 0930   02/15/2017 1730  metroNIDAZOLE (FLAGYL) IVPB 500 mg     500 mg 100 mL/hr over 60 Minutes Intravenous Every 8 hours 02/25/2017 1429 02/02/17 0325   01/28/2017 0822  cefTRIAXone (ROCEPHIN) 2 g in dextrose 5 % 50 mL IVPB     2 g 100 mL/hr over 30 Minutes Intravenous On call to O.R. 02/18/2017 6701 02/23/2017 0920   02/24/2017 4103  metroNIDAZOLE (FLAGYL) IVPB 500 mg  Status:  Discontinued     500 mg 100 mL/hr over 60 Minutes Intravenous Every 8 hours 02/13/2017 0822 02/10/2017  1330      Assessment/Plan: s/p Procedure(s): EXPLORATORY LAPAROTOMY LYSIS OF ADHESION Impression: Continues to be stable. We will reassess tomorrow.  LOS: 19 days    Aviva Signs 02/12/2017

## 2017-02-12 NOTE — Progress Notes (Addendum)
PHARMACY - ADULT TOTAL PARENTERAL NUTRITION CONSULT NOTE   Pharmacy Consult for TPN/ zosyn Indication: Bowel obstruction, surgery 4/5, intra-abdominal infection  Patient Measurements: Height: 6' (182.9 cm) Weight: 161 lb 6.4 oz (73.2 kg) IBW/kg (Calculated) : 77.6 TPN AdjBW (KG): 73.2 Body mass index is 21.89 kg/m.  Assessment: 83 year old male with history of hypertension, remote left kidney carcinoma status post partial nephrectomy, chronic bradycardia, recent back surgery who presented with abdominal pain for past 3 days. Denied any fevers, chills, nausea or vomiting. Denied dysuria or diarrhea.  Pt had surgery on 02/06/2017 for SBO. Patient has still not had return of bowel function. NG tube was replaced, CT scan of abdomen + for bowel perforation.   IR placed drainage tube 4/12.   Glucose control remains elevated with BS 155-176  (6 units SSI last 24 hours). AST and alk phos slightly elevated  GI: Pt had gone approximately 11 days with minimal to no nutrition. At this time, TPN warranted as patient showed intolerance to clear liquids.    Insulin requirements in the past 24 hours: none BMP Latest Ref Rng & Units 02/12/2017 02/11/2017 02/10/2017  Glucose 65 - 99 mg/dL 174(H) 172(H) 162(H)  BUN 6 - 20 mg/dL 52(H) 53(H) 60(H)  Creatinine 0.61 - 1.24 mg/dL 1.50(H) 1.39(H) 1.54(H)  Sodium 135 - 145 mmol/L 148(H) 148(H) 147(H)  Potassium 3.5 - 5.1 mmol/L 3.6 3.6 3.9  Chloride 101 - 111 mmol/L 105 106 104  CO2 22 - 32 mmol/L 37(H) 36(H) 35(H)  Calcium 8.9 - 10.3 mg/dL 7.6(L) 7.5(L) 7.6(L)   TPN Access: central IV access TPN start date: 01/31/17 Current Nutrition: TPN  Nutritional Goals (per RD recommendation on 01/30/2017): Estimated Nutritional Needs:  Kcal:  2050-2250 (28-31 kcal/kg bw) Protein:  88-102 g Pro (1.2-1.4 g/kg bw) Fluid:  >1.8 L (25 ml/kg bw)  Plan:  TPN rate at goal  Continue Clinimix E 5/15 at 35m/hr  20% lipid emulsion at 285mhr (infuse over 12 hours)  Maintenance IV  fluids per MD  Increase  Regular Insulin to 28 units/bag  Continue SSI q8h CBG checks  Cont zosyn 3.375 gm IV q8 hours  Monitor TPN labs, renal fxn, fluid balance, glucose tolerance  Thanks for allowing pharmacy to be a part of this patient's care.  LoExcell SeltzerPharmD Clinical Pharmacist 02/12/2017,12:02 PM

## 2017-02-12 NOTE — Progress Notes (Signed)
PROGRESS NOTE    Joshua Downs  OEU:[id number] DOB: Mar 08, 1931 DOA: 01/02/2017 PCP: Deloria Lair, MD    Brief Narrative: 83 year old man initially admitted to the hospital on 3/28 due to abdominal pain and found to have a small bowel obstruction. CT scan of the abdomen showed a high-grade small bowel obstruction with transition point in the mid abdomen. Was also noted to have acute kidney injury. NG tube was placed in the emergency department, surgical services were consulted. NG tube was initially removed on 3/30 and was reinserted on the same day due to recurrence of distention. NG tube was again removed on 4/3 due to patient's symptoms improving, unfortunately he has failed again with increased vomiting and NG tube has been replaced on 4/4. OR on 4/5 for ex lap with lysis of adhesions. Very slow to progress post op. Still with abd pain and distention. CT showedextravasatioin of contrast concerning for bowel perforation. He had IR placement of drainage tube, and is doing much better today.He is on TPN. No new event.  He has green bilous drainage at the umbilicus, but his wound is intact.     Assessment & Plan:   Principal Problem:   Small bowel obstruction (HCC) Active Problems:   Acute renal failure (ARF) (HCC)   Hyperglycemia   Protein-calorie malnutrition, severe   Malnutrition of moderate degree   Small bowel obstruction -He has failed conservative management and went to OR on 4/5 for ex lap and lysis of adhesions. -Follow surgical recommendations: keep NG in today. He is still having significant abdominal distention and hypertympani.  -Continue TPN. -CT scan 4/9with large amount of free air but no leakage of contrast. D/W Dr. Arnoldo Morale plan to redo CT and after that recommendations will follow. -Repeat CT 4/10 with extraluminal fluid/gas with extravasation of contrast concerning for bowel perforation. Dr. Arnoldo Morale aware. IR has placed drainage tube on April 12. Doing  better. Continue with Zosyn. No fever, or leukocytosys. -Still has not had return of bowel function. - Bile drainage thru incision, Dr Arnoldo Morale aware.  He plan to perform exploratory again on Wednesday.   Severe protein caloric malnutrition -Continue TPN. -Appreciate dietitian and pharmacy recommendations.  Hypernatremia -Resolved.  Hypertension -By mouth meds on hold given nothing by mouth state. -BP remains low normal.  Acute on CKD Stage III -Baseline Cr aroung 1.1-1.2. -Cr maxed at2.12. -down to 1.75 on 4/10. Now at 1.4  Wide Complex Tachycardia -Discussed via phone with cards, Dr. Meda Coffee and also with Dr. Bronson Ing. -Appears to be simply sinus tach with a wide QRS due to his RBBB. -After fluid bolus, HR has returned to 80s-90s.  DVT prophylaxis:Lovenox Code Status:Full code Family Communication:Sons at bedside updated on plan of care and all questions answered on 4/9. Disposition Plan:To be determined pending medical stability.   Antimicrobials: Anti-infectives    Start     Dose/Rate Route Frequency Ordered Stop   02/06/17 1700  piperacillin-tazobactam (ZOSYN) IVPB 3.375 g     3.375 g 12.5 mL/hr over 240 Minutes Intravenous Every 8 hours 02/06/17 1629     02/02/17 0900  cefTRIAXone (ROCEPHIN) 2 g in dextrose 5 % 50 mL IVPB     2 g 100 mL/hr over 30 Minutes Intravenous  Once 02/03/2017 1429 02/02/17 0930   02/04/2017 1730  metroNIDAZOLE (FLAGYL) IVPB 500 mg     500 mg 100 mL/hr over 60 Minutes Intravenous Every 8 hours 02/20/2017 1429 02/02/17 0325   02/26/2017 0822  cefTRIAXone (ROCEPHIN) 2 g in dextrose 5 %  50 mL IVPB     2 g 100 mL/hr over 30 Minutes Intravenous On call to O.R. 02/05/2017 2683 02/08/2017 0920   02/24/2017 0822  metroNIDAZOLE (FLAGYL) IVPB 500 mg  Status:  Discontinued     500 mg 100 mL/hr over 60 Minutes Intravenous Every 8 hours 02/14/2017 0822 02/18/2017 1330       Subjective:   Feeling better today.    Objective: Vitals:   02/11/17 0647  02/11/17 2345 02/12/17 0534 02/12/17 1437  BP: 129/78 117/64 120/68 (!) 111/98  Pulse: (!) 124 (!) 102 95 80  Resp: 20 20  20   Temp: 98.5 F (36.9 C) 98.5 F (36.9 C) 98.3 F (36.8 C) 97.6 F (36.4 C)  TempSrc: Oral Oral Axillary Oral  SpO2: 100% 100% 99% 100%  Weight:      Height:        Intake/Output Summary (Last 24 hours) at 02/12/17 1553 Last data filed at 02/12/17 1437  Gross per 24 hour  Intake          5884.08 ml  Output             3333 ml  Net          2551.08 ml   Filed Weights   01/18/2017 1204 01/09/2017 1807  Weight: 76.2 kg (168 lb) 73.2 kg (161 lb 6.4 oz)    Examination:  General exam: Appears calm and comfortable  Respiratory system: Clear to auscultation. Respiratory effort normal. Cardiovascular system: S1 & S2 heard, RRR. No JVD, murmurs, rubs, gallops or clicks. No pedal edema. Gastrointestinal system: Abdomen is nondistended, soft and nontender. No organomegaly or masses felt. Normal bowel sounds heard. Central nervous system: Alert and oriented. No focal neurological deficits. Extremities: Symmetric 5 x 5 power. Skin: No rashes, lesions or ulcers Psychiatry: Judgement and insight appear normal. Mood & affect appropriate.   Data Reviewed: I have personally reviewed following labs and imaging studies  CBC:  Recent Labs Lab 02/06/17 0414 02/07/17 0410 02/12/17 0425  WBC 7.4 7.3 10.6*  NEUTROABS  --  6.1 9.7*  HGB 12.9* 11.5* 11.1*  HCT 38.9* 33.9* 35.1*  MCV 85.9 85.2 89.5  PLT 218 198 419   Basic Metabolic Panel:  Recent Labs Lab 02/06/17 0414  02/08/17 0530 02/09/17 0936 02/10/17 0846 02/11/17 0645 02/12/17 0425  NA 136  < > 143 146* 147* 148* 148*  K 3.7  < > 3.2* 3.2* 3.9 3.6 3.6  CL 95*  < > 101 100* 104 106 105  CO2 32  < > 36* 40* 35* 36* 37*  GLUCOSE 156*  < > 165* 133* 162* 172* 174*  BUN 69*  < > 64* 56* 60* 53* 52*  CREATININE 1.75*  < > 1.33* 1.38* 1.54* 1.39* 1.50*  CALCIUM 7.6*  < > 7.6* 7.6* 7.6* 7.5* 7.6*  MG 1.9   --  2.1  --   --   --  2.2  PHOS 4.6  --  4.7*  --   --   --  4.5  < > = values in this interval not displayed.  Liver Function Tests:  Recent Labs Lab 02/07/17 0410 02/08/17 0530 02/12/17 0425  AST 26 35 43*  ALT 20 24 38  ALKPHOS 49 88 131*  BILITOT 0.8 1.6* 1.2  PROT 4.7* 5.0* 5.4*  ALBUMIN 1.4* 1.4* 1.4*   Coagulation Profile:  Recent Labs Lab 02/07/17 0410  INR 1.23   CBG:  Recent Labs Lab 02/11/17 0643 02/11/17 2214 02/12/17  0550 02/12/17 0809 02/12/17 1206  GLUCAP 150* 155* 159* 176* 160*   Lipid Profile:  Recent Labs  02/12/17 0425  TRIG 124   Recent Results (from the past 240 hour(s))  Aerobic/Anaerobic Culture (surgical/deep wound)     Status: None (Preliminary result)   Collection Time: 02/08/17  2:10 PM  Result Value Ref Range Status   Specimen Description ABSCESS  Final   Special Requests TRANSGLUTEAL DRAIN  Final   Gram Stain   Final    FEW WBC PRESENT, PREDOMINANTLY PMN NO ORGANISMS SEEN    Culture   Final    NO GROWTH 2 DAYS NO ANAEROBES ISOLATED; CULTURE IN PROGRESS FOR 5 DAYS   Report Status PENDING  Incomplete  Aerobic/Anaerobic Culture (surgical/deep wound)     Status: Abnormal (Preliminary result)   Collection Time: 02/08/17  2:11 PM  Result Value Ref Range Status   Specimen Description ABSCESS  Final   Special Requests RUQ PERIHEPATIC DRAIN  Final   Gram Stain   Final    ABUNDANT WBC PRESENT, PREDOMINANTLY PMN NO ORGANISMS SEEN    Culture (A)  Final    MULTIPLE ORGANISMS PRESENT, NONE PREDOMINANT NO ANAEROBES ISOLATED; CULTURE IN PROGRESS FOR 5 DAYS    Report Status PENDING  Incomplete     Radiology Studies: No results found.  Scheduled Meds: . sodium chloride   Intravenous Once  . diclofenac sodium  2 g Topical Daily  . insulin aspart  0-15 Units Subcutaneous Q8H  . LORazepam  0.5 mg Intravenous QHS  . piperacillin-tazobactam (ZOSYN)  IV  3.375 g Intravenous Q8H  . sodium chloride  500 mL Intravenous Once  .  sodium chloride flush  5 mL Intracatheter Q8H   Continuous Infusions: . Marland KitchenTPN (CLINIMIX-E) Adult 80 mL/hr (02/11/17 1921)  . Marland KitchenTPN (CLINIMIX-E) Adult     And  . fat emulsion    . sodium chloride 125 mL/hr at 02/12/17 1126     LOS: 19 days   Seung Nidiffer, MD [facility].   If 7PM-7AM, please contact night-coverage [email or web address] Password [facility] 02/12/2017, 3:53 PM

## 2017-02-13 LAB — AEROBIC/ANAEROBIC CULTURE (SURGICAL/DEEP WOUND): CULTURE: NO GROWTH

## 2017-02-13 LAB — GLUCOSE, CAPILLARY
GLUCOSE-CAPILLARY: 75 mg/dL (ref 65–99)
Glucose-Capillary: 130 mg/dL — ABNORMAL HIGH (ref 65–99)
Glucose-Capillary: 111 mg/dL — ABNORMAL HIGH (ref 65–99)
Glucose-Capillary: 94 mg/dL (ref 65–99)
Glucose-Capillary: 81 mg/dL (ref 65–99)

## 2017-02-13 LAB — AEROBIC/ANAEROBIC CULTURE W GRAM STAIN (SURGICAL/DEEP WOUND)

## 2017-02-13 LAB — PREPARE RBC (CROSSMATCH)

## 2017-02-13 MED ORDER — CHLORHEXIDINE GLUCONATE CLOTH 2 % EX PADS
6.0000 | MEDICATED_PAD | Freq: Once | CUTANEOUS | Status: AC
  Administered 2017-02-13: 6 via TOPICAL

## 2017-02-13 MED ORDER — CHLORHEXIDINE GLUCONATE CLOTH 2 % EX PADS: 6.0000 | MEDICATED_PAD | Freq: Once | CUTANEOUS | Status: AC

## 2017-02-13 MED ORDER — CHLORHEXIDINE GLUCONATE CLOTH 2 % EX PADS
6.0000 | MEDICATED_PAD | Freq: Once | CUTANEOUS | Status: AC
  Administered 2017-02-14: 6 via TOPICAL

## 2017-02-13 NOTE — Care Management Note (Signed)
Case Management Note  Patient Details  Name: Joshua Downs MRN: [id number] Date of Birth: 12-10-1930  If discussed at [employer] Length of Stay Meetings, dates discussed:  02/13/2017  Additional Comments: CM will cont to follow.  Sherald Barge, RN 02/13/2017, 2:42 PM

## 2017-02-13 NOTE — Progress Notes (Signed)
PROGRESS NOTE    Joshua Downs  BDZ:[id number] DOB: Sep 04, 1931 DOA: 12/28/2016 PCP: Deloria Lair, MD    Brief Narrative: 83 year old man initially admitted to the hospital on 3/28 due to abdominal pain and found to have a small bowel obstruction. CT scan of the abdomen showed a high-grade small bowel obstruction with transition point in the mid abdomen. Was also noted to have acute kidney injury. NG tube was placed in the emergency department, surgical services were consulted. NG tube was initially removed on 3/30 and was reinserted on the same day due to recurrence of distention. NG tube was again removed on 4/3 due to patient's symptoms improving, unfortunately he has failed again with increased vomiting and NG tube has been replaced on 4/4. OR on 4/5 for ex lap with lysis of adhesions. Very slow to progress post op. Still with abd pain and distention. CT showedextravasatioin of contrast concerning for bowel perforation. He had IR placement of drainage tube, and is doing much better today.He is on TPN. No new event. He has green bilous drainage at the umbilicus, but his wound is intact.    Assessment & Plan:   Principal Problem:   Small bowel obstruction (HCC) Active Problems:   Acute renal failure (ARF) (HCC)   Hyperglycemia   Protein-calorie malnutrition, severe   Malnutrition of moderate degree   Small bowel obstruction -He has failed conservative management and went to OR on 4/5 for ex lap and lysis of adhesions. -Follow surgical recommendations: keep NG in today. He is still having significant abdominal distention and hypertympani.  -Continue TPN. -CT scan 4/9with large amount of free air but no leakage of contrast. D/W Dr. Arnoldo Morale plan to redo CT and after that recommendations will follow. -Repeat CT 4/10 with extraluminal fluid/gas with extravasation of contrast concerning for bowel perforation. Dr. Arnoldo Morale aware. IR has placed drainage tube on April 12. Doing better.  Continue with Zosyn. No fever, or leukocytosys. -Still has not had return of bowel function. - Bile drainage thru incision, Dr Arnoldo Morale aware. He plan to perform exploratory again on Wednesday.   Severe protein caloric malnutrition -Continue TPN. -Appreciate dietitian and pharmacy recommendations.  Hypernatremia -Resolved.  Hypertension -By mouth meds on hold given nothing by mouth state. -BP remains low normal.  Acute on CKD Stage III -Baseline Cr aroung 1.1-1.2. -Cr maxed at2.12. -down to 1.75 on 4/10. Now at 1.4  Wide Complex Tachycardia -Discussed via phone with cards, Dr. Meda Coffee and also with Dr. Bronson Ing. -Appears to be simply sinus tach with a wide QRS due to his RBBB. -After fluid bolus, HR has returned to 80s-90s.  DVT prophylaxis:Lovenox Code Status:Full code Family Communication:Sons at bedside updated on plan of care and all questions answered on 4/9. Disposition Plan:To be determined pending medical stability.  Antimicrobials: Anti-infectives    Start     Dose/Rate Route Frequency Ordered Stop   02/06/17 1700  piperacillin-tazobactam (ZOSYN) IVPB 3.375 g     3.375 g 12.5 mL/hr over 240 Minutes Intravenous Every 8 hours 02/06/17 1629     02/02/17 0900  cefTRIAXone (ROCEPHIN) 2 g in dextrose 5 % 50 mL IVPB     2 g 100 mL/hr over 30 Minutes Intravenous  Once 02/18/2017 1429 02/02/17 0930   01/30/2017 1730  metroNIDAZOLE (FLAGYL) IVPB 500 mg     500 mg 100 mL/hr over 60 Minutes Intravenous Every 8 hours 02/12/2017 1429 02/02/17 0325   02/10/2017 0822  cefTRIAXone (ROCEPHIN) 2 g in dextrose 5 % 50 mL IVPB  2 g 100 mL/hr over 30 Minutes Intravenous On call to O.R. 02/23/2017 8676 02/20/2017 0920   02/20/2017 0822  metroNIDAZOLE (FLAGYL) IVPB 500 mg  Status:  Discontinued     500 mg 100 mL/hr over 60 Minutes Intravenous Every 8 hours 02/11/2017 0822 01/28/2017 1330       Subjective:  No complaints.   Objective: Vitals:   02/12/17 0534 02/12/17 1437  02/12/17 2148 02/13/17 0605  BP: 120/68 (!) 111/98 119/65 118/74  Pulse: 95 80 95 92  Resp:  20 (!) 21 (!) 22  Temp: 98.3 F (36.8 C) 97.6 F (36.4 C) 98.3 F (36.8 C) 98.7 F (37.1 C)  TempSrc: Axillary Oral Axillary Axillary  SpO2: 99% 100% 99% 100%  Weight:      Height:        Intake/Output Summary (Last 24 hours) at 02/13/17 1101 Last data filed at 02/13/17 0817  Gross per 24 hour  Intake          4885.83 ml  Output             2943 ml  Net          1942.83 ml   Filed Weights   01/02/2017 1204 01/16/2017 1807  Weight: 76.2 kg (168 lb) 73.2 kg (161 lb 6.4 oz)    Examination:  General exam: Appears calm and comfortable  Respiratory system: Clear to auscultation. Respiratory effort normal. Cardiovascular system: S1 & S2 heard, RRR. No JVD, murmurs, rubs, gallops or clicks. No pedal edema. Gastrointestinal system: Abdomen is nondistended, soft and nontender. No organomegaly or masses felt. Normal bowel sounds heard. Central nervous system: Alert and oriented. No focal neurological deficits. Extremities: Symmetric 5 x 5 power. Skin: No rashes, lesions or ulcers Psychiatry: Judgement and insight appear normal. Mood & affect appropriate.   Data Reviewed: I have personally reviewed following labs and imaging studies  CBC:  Recent Labs Lab 02/07/17 0410 02/12/17 0425  WBC 7.3 10.6*  NEUTROABS 6.1 9.7*  HGB 11.5* 11.1*  HCT 33.9* 35.1*  MCV 85.2 89.5  PLT 198 720   Basic Metabolic Panel:  Recent Labs Lab 02/08/17 0530 02/09/17 0936 02/10/17 0846 02/11/17 0645 02/12/17 0425  NA 143 146* 147* 148* 148*  K 3.2* 3.2* 3.9 3.6 3.6  CL 101 100* 104 106 105  CO2 36* 40* 35* 36* 37*  GLUCOSE 165* 133* 162* 172* 174*  BUN 64* 56* 60* 53* 52*  CREATININE 1.33* 1.38* 1.54* 1.39* 1.50*  CALCIUM 7.6* 7.6* 7.6* 7.5* 7.6*  MG 2.1  --   --   --  2.2  PHOS 4.7*  --   --   --  4.5   GFR: Estimated Creatinine Clearance: 38 mL/min (A) (by C-G formula based on SCr of 1.5  mg/dL (H)). Liver Function Tests:  Recent Labs Lab 02/07/17 0410 02/08/17 0530 02/12/17 0425  AST 26 35 43*  ALT 20 24 38  ALKPHOS 49 88 131*  BILITOT 0.8 1.6* 1.2  PROT 4.7* 5.0* 5.4*  ALBUMIN 1.4* 1.4* 1.4*   Coagulation Profile:  Recent Labs Lab 02/07/17 0410  INR 1.23   CBG:  Recent Labs Lab 02/12/17 1206 02/12/17 1503 02/12/17 2115 02/13/17 0532 02/13/17 0740  GLUCAP 160* 129* 119* 130* 111*   Lipid Profile:  Recent Labs  02/12/17 0425  TRIG 124   Thyroid Function Tests:  Recent Results (from the past 240 hour(s))  Aerobic/Anaerobic Culture (surgical/deep wound)     Status: None   Collection Time: 02/08/17  2:10 PM  Result Value Ref Range Status   Specimen Description ABSCESS  Final   Special Requests TRANSGLUTEAL DRAIN  Final   Gram Stain   Final    FEW WBC PRESENT, PREDOMINANTLY PMN NO ORGANISMS SEEN    Culture No growth aerobically or anaerobically.  Final   Report Status 02/13/2017 FINAL  Final  Aerobic/Anaerobic Culture (surgical/deep wound)     Status: Abnormal (Preliminary result)   Collection Time: 02/08/17  2:11 PM  Result Value Ref Range Status   Specimen Description ABSCESS  Final   Special Requests RUQ PERIHEPATIC DRAIN  Final   Gram Stain   Final    ABUNDANT WBC PRESENT, PREDOMINANTLY PMN NO ORGANISMS SEEN    Culture (A)  Final    MULTIPLE ORGANISMS PRESENT, NONE PREDOMINANT HOLDING FOR POSSIBLE ANAEROBE    Report Status PENDING  Incomplete     Radiology Studies: No results found.  Scheduled Meds: . diclofenac sodium  2 g Topical Daily  . insulin aspart  0-15 Units Subcutaneous Q8H  . LORazepam  0.5 mg Intravenous QHS  . sodium chloride flush  5 mL Intracatheter Q8H   Continuous Infusions: . Marland KitchenTPN (CLINIMIX-E) Adult 80 mL/hr (02/12/17 1754)  . sodium chloride 125 mL/hr at 02/13/17 0511  . sodium chloride    . piperacillin-tazobactam (ZOSYN)  IV    . sodium chloride       LOS: 20 days   Joaopedro Eschbach, MD  FACP Hospitalist.   If 7PM-7AM, please contact night-coverage [email or web address] Password [email or web address] 02/13/2017, 11:01 AM

## 2017-02-13 NOTE — Progress Notes (Signed)
Patient scheduled for exploratory laparotomy tomorrow. Orders have been written. Patient will be weaned off TPN today. The risks and benefits of the procedure including bleeding, infection, cardiopulmonary difficulties, and the possibility of a bowel resection were fully explained to the patient and family, who gave informed consent.

## 2017-02-13 NOTE — Progress Notes (Signed)
PHARMACY - ADULT TOTAL PARENTERAL NUTRITION CONSULT NOTE   Pharmacy Consult for TPN/ zosyn Indication: Bowel obstruction, surgery 4/5, intra-abdominal infection  Patient Measurements: Height: 6' (182.9 cm) Weight: 161 lb 6.4 oz (73.2 kg) IBW/kg (Calculated) : 77.6 TPN AdjBW (KG): 73.2 Body mass index is 21.89 kg/m.  Assessment: 83 year old male with history of hypertension, remote left kidney carcinoma status post partial nephrectomy, chronic bradycardia, recent back surgery who presented with abdominal pain for past 3 days. Denied any fevers, chills, nausea or vomiting. Denied dysuria or diarrhea.  Pt had surgery on 02/17/2017 for SBO. Patient has still not had return of bowel function. NG tube was replaced, CT scan of abdomen + for bowel perforation.   IR placed drainage tube 4/12.   Glucose control remains elevated with BS 155-176  (6 units SSI last 24 hours). AST and alk phos slightly elevated  GI: Pt had gone approximately 11 days with minimal to no nutrition. At this time, TPN warranted as patient showed intolerance to clear liquids.    Insulin requirements in the past 24 hours: none BMP Latest Ref Rng & Units 02/12/2017 02/11/2017 02/10/2017  Glucose 65 - 99 mg/dL 174(H) 172(H) 162(H)  BUN 6 - 20 mg/dL 52(H) 53(H) 60(H)  Creatinine 0.61 - 1.24 mg/dL 1.50(H) 1.39(H) 1.54(H)  Sodium 135 - 145 mmol/L 148(H) 148(H) 147(H)  Potassium 3.5 - 5.1 mmol/L 3.6 3.6 3.9  Chloride 101 - 111 mmol/L 105 106 104  CO2 22 - 32 mmol/L 37(H) 36(H) 35(H)  Calcium 8.9 - 10.3 mg/dL 7.6(L) 7.5(L) 7.6(L)   TPN Access: central IV access TPN start date: 01/31/17 Current Nutrition: TPN  Nutritional Goals (per RD recommendation on 01/30/2017): Estimated Nutritional Needs:  Kcal:  2050-2250 (28-31 kcal/kg bw) Protein:  88-102 g Pro (1.2-1.4 g/kg bw) Fluid:  >1.8 L (25 ml/kg bw)  Plan:  TPN rate at goal - D/W Dr Arnoldo Morale, wean TPN today and restart after surgery  Taper Clinimix E 5/15 to 47m/hr now then stop TPN  at 6pm tonight  20% lipid emulsion at 249mhr (infuse over 12 hours) [HOLD UNTIL AFTER SURGERY]  Maintenance IV fluids per MD  Regular Insulin to 28 units/bag  Continue SSI q8h CBG checks  Cont zosyn 3.375 gm IV q8 hours  Monitor TPN labs, renal fxn, fluid balance, glucose tolerance  Thanks for allowing pharmacy to be a part of this patient's care.  ScHart RobinsonsPharmD Clinical Pharmacist Pager:  [phone] 1190/17/2018   02/13/2017,8:18 AM

## 2017-02-13 NOTE — Progress Notes (Signed)
Dr. Hilbert Bible paged and made aware of pts blood sugar of 81 @ 2130, and recheck and now 75. Adv TPN was stopped d/t procedure in am. Waiting for orders.

## 2017-02-14 ENCOUNTER — Inpatient Hospital Stay (HOSPITAL_COMMUNITY): Payer: Medicare Other | Admitting: Anesthesiology

## 2017-02-14 ENCOUNTER — Encounter (HOSPITAL_COMMUNITY): Payer: Self-pay | Admitting: *Deleted

## 2017-02-14 ENCOUNTER — Encounter (HOSPITAL_COMMUNITY): Admission: EM | Disposition: E | Payer: Self-pay | Source: Home / Self Care | Attending: Internal Medicine

## 2017-02-14 ENCOUNTER — Inpatient Hospital Stay (HOSPITAL_COMMUNITY): Payer: Medicare Other

## 2017-02-14 DIAGNOSIS — K9189 Other postprocedural complications and disorders of digestive system: Secondary | ICD-10-CM

## 2017-02-14 HISTORY — PX: LAPAROTOMY: SHX154

## 2017-02-14 HISTORY — PX: BOWEL RESECTION: SHX1257

## 2017-02-14 LAB — COMPREHENSIVE METABOLIC PANEL
ALK PHOS: 92 U/L (ref 38–126)
ALT: 28 U/L (ref 17–63)
AST: 35 U/L (ref 15–41)
Albumin: 1.2 g/dL — ABNORMAL LOW (ref 3.5–5.0)
Anion gap: 5 (ref 5–15)
BILIRUBIN TOTAL: 1.7 mg/dL — AB (ref 0.3–1.2)
BUN: 43 mg/dL — ABNORMAL HIGH (ref 6–20)
CALCIUM: 6.9 mg/dL — AB (ref 8.9–10.3)
CO2: 38 mmol/L — AB (ref 22–32)
Chloride: 109 mmol/L (ref 101–111)
Creatinine, Ser: 1.68 mg/dL — ABNORMAL HIGH (ref 0.61–1.24)
GFR, EST AFRICAN AMERICAN: 41 mL/min — AB (ref >60)
GFR, EST NON AFRICAN AMERICAN: 36 mL/min — AB (ref >60)
Glucose, Bld: 120 mg/dL — ABNORMAL HIGH (ref 65–99)
Potassium: 3.4 mmol/L — ABNORMAL LOW (ref 3.5–5.1)
Sodium: 152 mmol/L — ABNORMAL HIGH (ref 135–145)
Total Protein: 4.6 g/dL — ABNORMAL LOW (ref 6.5–8.1)

## 2017-02-14 LAB — CBC
HEMATOCRIT: 38.2 % — AB (ref 39.0–52.0)
Hemoglobin: 12.1 g/dL — ABNORMAL LOW (ref 13.0–17.0)
MCH: 28.1 pg (ref 26.0–34.0)
MCHC: 31.7 g/dL (ref 30.0–36.0)
MCV: 88.6 fL (ref 78.0–100.0)
Platelets: 348 10*3/uL (ref 150–400)
RBC: 4.31 MIL/uL (ref 4.22–5.81)
RDW: 16.4 % — ABNORMAL HIGH (ref 11.5–15.5)
WBC: 9 10*3/uL (ref 4.0–10.5)

## 2017-02-14 LAB — BLOOD GAS, ARTERIAL
ACID-BASE EXCESS: 12 mmol/L — AB (ref 0.0–2.0)
Acid-Base Excess: 10.9 mmol/L — ABNORMAL HIGH (ref 0.0–2.0)
BICARBONATE: 35.3 mmol/L — AB (ref 20.0–28.0)
Bicarbonate: 34 mmol/L — ABNORMAL HIGH (ref 20.0–28.0)
DRAWN BY: 270161
FIO2: 100
FIO2: 100
O2 SAT: 98.9 %
O2 Saturation: 96.5 %
PATIENT TEMPERATURE: 37.5
PH ART: 7.504 — AB (ref 7.350–7.450)
Patient temperature: 37
pCO2 arterial: 49.5 mmHg — ABNORMAL HIGH (ref 32.0–48.0)
pCO2 arterial: 46.1 mmHg (ref 32.0–48.0)
pH, Arterial: 7.467 — ABNORMAL HIGH (ref 7.350–7.450)
pO2, Arterial: 85.6 mmHg (ref 83.0–108.0)
pO2, Arterial: 156 mmHg — ABNORMAL HIGH (ref 83.0–108.0)

## 2017-02-14 LAB — BASIC METABOLIC PANEL
Anion gap: 9 (ref 5–15)
BUN: 45 mg/dL — ABNORMAL HIGH (ref 6–20)
CALCIUM: 7.6 mg/dL — AB (ref 8.9–10.3)
CO2: 41 mmol/L — AB (ref 22–32)
CREATININE: 1.54 mg/dL — AB (ref 0.61–1.24)
Chloride: 106 mmol/L (ref 101–111)
GFR, EST AFRICAN AMERICAN: 46 mL/min — AB (ref >60)
GFR, EST NON AFRICAN AMERICAN: 40 mL/min — AB (ref >60)
Glucose, Bld: 116 mg/dL — ABNORMAL HIGH (ref 65–99)
Potassium: 3.3 mmol/L — ABNORMAL LOW (ref 3.5–5.1)
SODIUM: 156 mmol/L — AB (ref 135–145)

## 2017-02-14 LAB — GLUCOSE, CAPILLARY
Glucose-Capillary: 92 mg/dL (ref 65–99)
Glucose-Capillary: 120 mg/dL — ABNORMAL HIGH (ref 65–99)

## 2017-02-14 LAB — PREPARE RBC (CROSSMATCH)

## 2017-02-14 SURGERY — LAPAROTOMY, EXPLORATORY
Anesthesia: General | Site: Abdomen

## 2017-02-14 MED ORDER — DEXTROSE-NACL 5-0.45 % IV SOLN
INTRAVENOUS | Status: DC
  Administered 2017-02-14: via INTRAVENOUS

## 2017-02-14 MED ORDER — FENTANYL CITRATE (PF) 100 MCG/2ML IJ SOLN
INTRAMUSCULAR | Status: AC
  Filled 2017-02-14: qty 2

## 2017-02-14 MED ORDER — ACETAMINOPHEN 325 MG PO TABS
650.0000 mg | ORAL_TABLET | Freq: Four times a day (QID) | ORAL | Status: DC | PRN
Start: 2017-02-14 — End: 2017-02-19

## 2017-02-14 MED ORDER — PHENYLEPHRINE 40 MCG/ML (10ML) SYRINGE FOR IV PUSH (FOR BLOOD PRESSURE SUPPORT)
PREFILLED_SYRINGE | INTRAVENOUS | Status: AC
  Filled 2017-02-14: qty 10

## 2017-02-14 MED ORDER — CHLORHEXIDINE GLUCONATE 0.12 % MT SOLN
15.0000 mL | Freq: Two times a day (BID) | OROMUCOSAL | Status: DC
  Administered 2017-02-14: 15 mL via OROMUCOSAL
  Filled 2017-02-14: qty 15

## 2017-02-14 MED ORDER — ACETAMINOPHEN 650 MG RE SUPP: 650.0000 mg | Freq: Four times a day (QID) | RECTAL | Status: DC | PRN

## 2017-02-14 MED ORDER — FENTANYL CITRATE (PF) 100 MCG/2ML IJ SOLN
INTRAMUSCULAR | Status: DC | PRN
  Administered 2017-02-14: 25 ug via INTRAVENOUS
  Administered 2017-02-14: 50 ug via INTRAVENOUS

## 2017-02-14 MED ORDER — SODIUM CHLORIDE 0.9 % IJ SOLN
INTRAMUSCULAR | Status: AC
  Filled 2017-02-14: qty 10

## 2017-02-14 MED ORDER — HYDROMORPHONE HCL 1 MG/ML IJ SOLN: 0.2500 mg | INTRAMUSCULAR | Status: DC | PRN

## 2017-02-14 MED ORDER — LACTATED RINGERS IV SOLN
INTRAVENOUS | Status: DC | PRN
  Administered 2017-02-14: 11:00:00 via INTRAVENOUS

## 2017-02-14 MED ORDER — PROPOFOL 10 MG/ML IV BOLUS
INTRAVENOUS | Status: AC
  Filled 2017-02-14: qty 20

## 2017-02-14 MED ORDER — ENOXAPARIN SODIUM 40 MG/0.4ML ~~LOC~~ SOLN
40.0000 mg | SUBCUTANEOUS | Status: DC
  Administered 2017-02-15: 40 mg via SUBCUTANEOUS
  Filled 2017-02-14: qty 0.4

## 2017-02-14 MED ORDER — ALBUMIN HUMAN 25 % IV SOLN
50.0000 g | Freq: Once | INTRAVENOUS | Status: AC
  Administered 2017-02-14: 50 g via INTRAVENOUS
  Filled 2017-02-14: qty 200

## 2017-02-14 MED ORDER — PANTOPRAZOLE SODIUM 40 MG IV SOLR
40.0000 mg | Freq: Every day | INTRAVENOUS | Status: DC
  Administered 2017-02-14 – 2017-02-17 (×4): 40 mg via INTRAVENOUS
  Filled 2017-02-14 (×5): qty 40

## 2017-02-14 MED ORDER — SUCCINYLCHOLINE CHLORIDE 20 MG/ML IJ SOLN
INTRAMUSCULAR | Status: DC | PRN
  Administered 2017-02-14: 100 mg via INTRAVENOUS

## 2017-02-14 MED ORDER — PHENYLEPHRINE HCL 10 MG/ML IJ SOLN
INTRAMUSCULAR | Status: AC
  Filled 2017-02-14: qty 1

## 2017-02-14 MED ORDER — NEOSTIGMINE METHYLSULFATE 10 MG/10ML IV SOLN
INTRAVENOUS | Status: DC | PRN
  Administered 2017-02-14: 3 mg via INTRAVENOUS

## 2017-02-14 MED ORDER — MIDAZOLAM HCL 2 MG/2ML IJ SOLN
INTRAMUSCULAR | Status: AC
  Filled 2017-02-14: qty 2

## 2017-02-14 MED ORDER — LACTATED RINGERS IV SOLN
INTRAVENOUS | Status: DC
  Administered 2017-02-14 – 2017-02-16 (×5): via INTRAVENOUS

## 2017-02-14 MED ORDER — MIDAZOLAM HCL 2 MG/2ML IJ SOLN
1.0000 mg | INTRAMUSCULAR | Status: DC
  Administered 2017-02-14: 2 mg via INTRAVENOUS

## 2017-02-14 MED ORDER — GLYCOPYRROLATE 0.2 MG/ML IJ SOLN
INTRAMUSCULAR | Status: DC | PRN
  Administered 2017-02-14: 0.6 mg via INTRAVENOUS

## 2017-02-14 MED ORDER — ONDANSETRON 4 MG PO TBDP
4.0000 mg | ORAL_TABLET | Freq: Four times a day (QID) | ORAL | Status: DC | PRN
  Filled 2017-02-14: qty 1

## 2017-02-14 MED ORDER — LACTATED RINGERS IV SOLN
INTRAVENOUS | Status: DC
  Administered 2017-02-14 (×2): via INTRAVENOUS

## 2017-02-14 MED ORDER — ORAL CARE MOUTH RINSE
15.0000 mL | Freq: Two times a day (BID) | OROMUCOSAL | Status: DC
Start: 2017-02-15 — End: 2017-02-15

## 2017-02-14 MED ORDER — PHENYLEPHRINE HCL 10 MG/ML IJ SOLN
INTRAMUSCULAR | Status: DC | PRN
  Administered 2017-02-14 (×2): 40 ug via INTRAVENOUS
  Administered 2017-02-14: 80 ug via INTRAVENOUS
  Administered 2017-02-14: 40 ug via INTRAVENOUS
  Administered 2017-02-14: 80 ug via INTRAVENOUS
  Administered 2017-02-14: 40 ug via INTRAVENOUS
  Administered 2017-02-14: 80 ug via INTRAVENOUS
  Administered 2017-02-14: 40 ug via INTRAVENOUS
  Administered 2017-02-14: 8 ug via INTRAVENOUS
  Administered 2017-02-14: 80 ug via INTRAVENOUS
  Administered 2017-02-14: 40 ug via INTRAVENOUS
  Administered 2017-02-14: 80 ug via INTRAVENOUS
  Administered 2017-02-14 (×3): 40 ug via INTRAVENOUS
  Administered 2017-02-14: 80 ug via INTRAVENOUS
  Administered 2017-02-14 (×2): 40 ug via INTRAVENOUS
  Administered 2017-02-14 (×3): 80 ug via INTRAVENOUS

## 2017-02-14 MED ORDER — EPHEDRINE SULFATE 50 MG/ML IJ SOLN
INTRAMUSCULAR | Status: AC
  Filled 2017-02-14: qty 1

## 2017-02-14 MED ORDER — FENTANYL CITRATE (PF) 100 MCG/2ML IJ SOLN
12.5000 ug | Freq: Once | INTRAMUSCULAR | Status: AC
  Administered 2017-02-14: 12.5 ug via INTRAVENOUS

## 2017-02-14 MED ORDER — FENTANYL CITRATE (PF) 100 MCG/2ML IJ SOLN
50.0000 ug | INTRAMUSCULAR | Status: DC | PRN
  Administered 2017-02-14 – 2017-02-19 (×22): 50 ug via INTRAVENOUS
  Filled 2017-02-14 (×22): qty 2

## 2017-02-14 MED ORDER — PROPOFOL 10 MG/ML IV BOLUS
INTRAVENOUS | Status: DC | PRN
  Administered 2017-02-14: 100 mg via INTRAVENOUS

## 2017-02-14 MED ORDER — SODIUM CHLORIDE 0.9 % IR SOLN
Status: DC | PRN
  Administered 2017-02-14: 5000 mL
  Administered 2017-02-14: 3000 mL

## 2017-02-14 MED ORDER — SODIUM CHLORIDE 0.9 % IV SOLN: 10.0000 mL/h | Freq: Once | INTRAVENOUS | Status: DC

## 2017-02-14 MED ORDER — ONDANSETRON HCL 4 MG/2ML IJ SOLN: 4.0000 mg | Freq: Four times a day (QID) | INTRAMUSCULAR | Status: DC | PRN

## 2017-02-14 MED ORDER — ALBUTEROL SULFATE (2.5 MG/3ML) 0.083% IN NEBU: 2.5000 mg | INHALATION_SOLUTION | RESPIRATORY_TRACT | Status: DC | PRN

## 2017-02-14 MED ORDER — GLYCOPYRROLATE 0.2 MG/ML IJ SOLN
INTRAMUSCULAR | Status: AC
  Filled 2017-02-14: qty 4

## 2017-02-14 MED ORDER — ROCURONIUM BROMIDE 100 MG/10ML IV SOLN
INTRAVENOUS | Status: DC | PRN
  Administered 2017-02-14: 10 mg via INTRAVENOUS
  Administered 2017-02-14: 15 mg via INTRAVENOUS
  Administered 2017-02-14: 5 mg via INTRAVENOUS

## 2017-02-14 MED ORDER — PIPERACILLIN-TAZOBACTAM 3.375 G IVPB
3.3750 g | Freq: Three times a day (TID) | INTRAVENOUS | Status: DC
  Administered 2017-02-14 – 2017-02-19 (×14): 3.375 g via INTRAVENOUS
  Filled 2017-02-14 (×14): qty 50

## 2017-02-14 MED ORDER — LIDOCAINE HCL (CARDIAC) 10 MG/ML IV SOLN
INTRAVENOUS | Status: DC | PRN
  Administered 2017-02-14: 30 mg via INTRAVENOUS

## 2017-02-14 MED ORDER — SODIUM CHLORIDE 0.9 % IV SOLN
INTRAVENOUS | Status: DC | PRN
  Administered 2017-02-14 (×2): via INTRAVENOUS

## 2017-02-14 MED ORDER — SODIUM CHLORIDE 0.9% FLUSH
INTRAVENOUS | Status: AC
  Filled 2017-02-14: qty 10

## 2017-02-14 MED ORDER — KETOROLAC TROMETHAMINE 30 MG/ML IJ SOLN: 30.0000 mg | Freq: Once | INTRAMUSCULAR | Status: DC

## 2017-02-14 SURGICAL SUPPLY — 73 items
APPLIER CLIP 11 MED OPEN (CLIP)
APPLIER CLIP 13 LRG OPEN (CLIP)
APR CLP LRG 13 20 CLIP (CLIP)
APR CLP MED 11 20 MLT OPN (CLIP)
BAG HAMPER (MISCELLANEOUS) ×3 IMPLANT
BARRIER SKIN 2 3/4 (OSTOMY) IMPLANT
BARRIER SKIN 2 3/4 INCH (OSTOMY)
BARRIER SKIN OD2.25 2 3/4 FLNG (OSTOMY) IMPLANT
BRR SKN FLT 2.75X2.25 2 PC (OSTOMY)
CELLS DAT CNTRL 66122 CELL SVR (MISCELLANEOUS) IMPLANT
CHLORAPREP W/TINT 26ML (MISCELLANEOUS) ×1 IMPLANT
CLAMP POUCH DRAINAGE QUIET (OSTOMY) IMPLANT
CLIP APPLIE 11 MED OPEN (CLIP) IMPLANT
CLIP APPLIE 13 LRG OPEN (CLIP) IMPLANT
CLOTH BEACON ORANGE TIMEOUT ST (SAFETY) ×3 IMPLANT
COVER LIGHT HANDLE STERIS (MISCELLANEOUS) ×6 IMPLANT
DRAPE WARM FLUID 44X44 (DRAPE) ×3 IMPLANT
DRSG OPSITE POSTOP 4X10 (GAUZE/BANDAGES/DRESSINGS) ×3 IMPLANT
ELECT BLADE 6 FLAT ULTRCLN (ELECTRODE) IMPLANT
ELECT REM PT RETURN 9FT ADLT (ELECTROSURGICAL) ×3
ELECTRODE REM PT RTRN 9FT ADLT (ELECTROSURGICAL) ×1 IMPLANT
EVACUATOR DRAINAGE 10X20 100CC (DRAIN) IMPLANT
EVACUATOR SILICONE 100CC (DRAIN) ×3
GAUZE PACKING 2X5 YD STRL (GAUZE/BANDAGES/DRESSINGS) ×3 IMPLANT
GAUZE SPONGE 4X4 12PLY STRL (GAUZE/BANDAGES/DRESSINGS) ×3 IMPLANT
GLOVE BIOGEL PI IND STRL 7.0 (GLOVE) ×2 IMPLANT
GLOVE BIOGEL PI INDICATOR 7.0 (GLOVE) ×4
GLOVE SURG SS PI 7.5 STRL IVOR (GLOVE) ×3 IMPLANT
GOWN STRL REUS W/TWL LRG LVL3 (GOWN DISPOSABLE) ×9 IMPLANT
HANDLE SUCTION POOLE (INSTRUMENTS) ×1 IMPLANT
INST SET MAJOR GENERAL (KITS) ×3 IMPLANT
KIT REMOVER STAPLE SKIN (MISCELLANEOUS) ×2 IMPLANT
KIT ROOM TURNOVER APOR (KITS) ×3 IMPLANT
MANIFOLD NEPTUNE II (INSTRUMENTS) ×3 IMPLANT
NEEDLE HYPO 18GX1.5 BLUNT FILL (NEEDLE) ×3 IMPLANT
NS IRRIG 1000ML POUR BTL (IV SOLUTION) ×18 IMPLANT
PACK ABDOMINAL MAJOR (CUSTOM PROCEDURE TRAY) ×3 IMPLANT
PAD ARMBOARD 7.5X6 YLW CONV (MISCELLANEOUS) ×3 IMPLANT
POUCH OSTOMY 2 3/4  H 3804 (WOUND CARE)
POUCH OSTOMY 2 3/4 H 3804 (WOUND CARE)
POUCH OSTOMY 2 PC DRNBL 2.75 (WOUND CARE) IMPLANT
RELOAD LINEAR CUT PROX 55 BLUE (ENDOMECHANICALS) IMPLANT
RELOAD PROXIMATE 75MM BLUE (ENDOMECHANICALS) ×18 IMPLANT
RELOAD STAPLE 55 3.8 BLU REG (ENDOMECHANICALS) IMPLANT
RELOAD STAPLE 75 3.8 BLU REG (ENDOMECHANICALS) IMPLANT
RELOAD STAPLER LINE PROX 60 GR (STAPLE) ×1 IMPLANT
RETRACTOR WND ALEXIS 25 LRG (MISCELLANEOUS) IMPLANT
RTRCTR WOUND ALEXIS 18CM MED (MISCELLANEOUS)
RTRCTR WOUND ALEXIS 25CM LRG (MISCELLANEOUS)
SEALER TISSUE X1 CVD JAW (INSTRUMENTS) ×3 IMPLANT
SET BASIN LINEN APH (SET/KITS/TRAYS/PACK) ×3 IMPLANT
SPONGE DRAIN TRACH 4X4 STRL 2S (GAUZE/BANDAGES/DRESSINGS) ×3 IMPLANT
SPONGE LAP 18X18 X RAY DECT (DISPOSABLE) ×7 IMPLANT
STAPLER GUN LINEAR PROX 60 (STAPLE) ×2 IMPLANT
STAPLER PROXIMATE 55 BLUE (STAPLE) IMPLANT
STAPLER PROXIMATE 75MM BLUE (STAPLE) ×2 IMPLANT
STAPLER RELOAD LINE PROX 60 GR (STAPLE) ×3
STAPLER RELOADABLE 60 GRN THCK (STAPLE) IMPLANT
STAPLER VISISTAT (STAPLE) ×3 IMPLANT
SUCTION POOLE HANDLE (INSTRUMENTS) ×3
SUT CHROMIC 0 SH (SUTURE) IMPLANT
SUT CHROMIC 2 0 SH (SUTURE) IMPLANT
SUT CHROMIC 3 0 SH 27 (SUTURE) ×1 IMPLANT
SUT NOVA NAB GS-26 0 60 (SUTURE) ×4 IMPLANT
SUT PDS AB 0 CTX 60 (SUTURE) ×6 IMPLANT
SUT PROLENE 2 0 SH 30 (SUTURE) ×3 IMPLANT
SUT SILK 2 0 (SUTURE)
SUT SILK 2 0 REEL (SUTURE) ×1 IMPLANT
SUT SILK 2-0 18XBRD TIE 12 (SUTURE) ×1 IMPLANT
SUT SILK 3 0 SH CR/8 (SUTURE) ×2 IMPLANT
SYR 20CC LL (SYRINGE) ×3 IMPLANT
TAPE CLOTH SURG 4X10 WHT LF (GAUZE/BANDAGES/DRESSINGS) ×2 IMPLANT
TRAY FOLEY CATH SILVER 16FR (SET/KITS/TRAYS/PACK) ×3 IMPLANT

## 2017-02-14 NOTE — Progress Notes (Signed)
[facility] Progress Note Patient Name: SHALIN VONBARGEN DOB: [birth year] MRN: [id number]   Date of Service  02/15/2017  HPI/Events of Note  83 yo WM 2 weeks ago s/p ex lap for SBO, lysis of adhesion, had bowle leakage, 2nd ex lap showed wound dehiscence, patient in resp distress, increased WOB, not responding  eICU Interventions  Sepsis, ICU status, resp failure, Gen surgery is aware, bedside ICU nurse has contacted surgeon. No further recs as per surgery. Prognosis is gaurded     Intervention Category Evaluation Type: New Patient Evaluation  Flora Lipps 02/17/2017, 4:25 PM

## 2017-02-14 NOTE — Anesthesia Postprocedure Evaluation (Signed)
Anesthesia Post Note  Patient: Joshua Downs  Procedure(s) Performed: Procedure(s) (LRB): EXPLORATORY LAPAROTOMY (N/A) PARTIAL SMALL BOWEL RESECTION X 2 (N/A)  Patient location during evaluation: PACU Anesthesia Type: General Level of consciousness: awake Pain management: satisfactory to patient Vital Signs Assessment: vitals unstable Respiratory status: spontaneous breathing and patient connected to face mask oxygen Cardiovascular status: unstable Anesthetic complications: no Comments: Dr. Patsey Berthold and Arnoldo Morale in attendance. B/P labile.     Last Vitals:  Vitals:   01/31/2017 1400 02/18/2017 1415  BP: (!) 249/200 135/88  Pulse: (!) 102   Resp: (!) 31 (!) 31  Temp:      Last Pain:  Vitals:   01/31/2017 0843  TempSrc:   PainSc: 0-No pain                 Seana Underwood

## 2017-02-14 NOTE — OR Nursing (Signed)
Left arm noted to have selling below elbow area.  Denies any pain.  Arm elevated and pillow placed under to raise it up.  Reported to dr. Patsey Berthold  And  Dr.  Arnoldo Morale

## 2017-02-14 NOTE — Anesthesia Procedure Notes (Signed)
Procedure Name: Intubation Date/Time: 02/20/2017 10:31 AM Performed by: Vista Deck Pre-anesthesia Checklist: Patient identified, Patient being monitored, Timeout performed, Emergency Drugs available and Suction available Patient Re-evaluated:Patient Re-evaluated prior to inductionOxygen Delivery Method: Circle System Utilized Preoxygenation: Pre-oxygenation with 100% oxygen Intubation Type: IV induction, Rapid sequence and Cricoid Pressure applied Laryngoscope Size: Miller and 2 Grade View: Grade I Tube type: Oral Tube size: 7.0 mm Number of attempts: 1 Airway Equipment and Method: Stylet and Oral airway Placement Confirmation: ETT inserted through vocal cords under direct vision,  positive ETCO2 and breath sounds checked- equal and bilateral Secured at: 22 cm Tube secured with: Tape Dental Injury: Teeth and Oropharynx as per pre-operative assessment

## 2017-02-14 NOTE — Progress Notes (Signed)
At bedside in PACU. Ultrasound shows partial clot in the left basilic vein. The left subclavian vein is clear. We will have PICC line moved to the right IJ. Hemoglobin stable. ABG has been reviewed. Chest x-ray does not show pulmonary edema. Being that is pending. Will continue care in ICU.

## 2017-02-14 NOTE — Transfer of Care (Signed)
Immediate Anesthesia Transfer of Care Note  Patient: Joshua Downs  Procedure(s) Performed: Procedure(s): EXPLORATORY LAPAROTOMY (N/A) PARTIAL SMALL BOWEL RESECTION X 2 (N/A)  Patient Location: PACU  Anesthesia Type:General  Level of Consciousness: awake and patient cooperative  Airway & Oxygen Therapy: Patient Spontanous Breathing and non-rebreather face mask  Post-op Assessment: Report given to RN and Post -op Vital signs reviewed and stable  Post vital signs: Reviewed and stable  Last Vitals:  Vitals:   01/29/2017 0534 02/12/2017 0843  BP: 122/61 108/67  Pulse: 97   Resp: 18   Temp: 36.8 C 36.8 C    Last Pain:  Vitals:   02/13/2017 0843  TempSrc:   PainSc: 0-No pain      Patients Stated Pain Goal: 0 (67/20/91 9802)  Complications: No apparent anesthesia complications

## 2017-02-14 NOTE — H&P (View-Only) (Signed)
Patient scheduled for exploratory laparotomy tomorrow. Orders have been written. Patient will be weaned off TPN today. The risks and benefits of the procedure including bleeding, infection, cardiopulmonary difficulties, and the possibility of a bowel resection were fully explained to the patient and family, who gave informed consent.

## 2017-02-14 NOTE — Interval H&P Note (Signed)
History and Physical Interval Note:  02/09/2017 9:30 AM  Joshua Downs  has presented today for surgery, with the diagnosis of bowel leak  The various methods of treatment have been discussed with the patient and family. After consideration of risks, benefits and other options for treatment, the patient has consented to  Procedure(s): EXPLORATORY LAPAROTOMY (N/A) as a surgical intervention .  The patient's history has been reviewed, patient examined, no change in status, stable for surgery.  I have reviewed the patient's chart and labs.  Questions were answered to the patient's satisfaction.     Aviva Signs

## 2017-02-14 NOTE — Progress Notes (Signed)
Patient is settled into the ICU. RR continue to be in the 30's-40's. MD Arnoldo Morale made aware. ABG's stable at moment. Pt complains of no pain. MD Arnoldo Morale advised RN to give PRN fentanyl dose to see if this would help lower patient's RR.   BP has been low Systolic 41'P-37'T.  BLE mottled Pt follows commands and responds to voice Urine is bloody in appearance  RN spoke with son Richardson Landry [facility]) he states pt wishes to remain full code, but in case of intubation, pt does not want to remain on the ventilator for an extended period of time.  Margaret Pyle, RN

## 2017-02-14 NOTE — OR Nursing (Signed)
Naso suction   Hooked to med suction  No  Drainage noted in tubing

## 2017-02-14 NOTE — Op Note (Signed)
Patient:  Joshua Downs  DOB:  [birth year]  MRN:  [id number]   Preop Diagnosis:  Bowel leak, status post exploratory laparotomy, lysis of adhesions, repair small bowel enterotomies  Postop Diagnosis:  Same  Procedure:  Exploratory laparotomy, partial small bowel resection 2  Surgeon:  Aviva Signs, M.D.  Anes:  Gen. endotracheal  Indications:  Patient is an 83 year old white male status post exploratory laparotomy, lysis of adhesions, repair small bowel enterotomies by Dr. Rosana Hoes 2 weeks ago who postoperatively developed bowel leakage. He was on TPN and the leak was controlled using percutaneously placed drains. The patient now comes to the operating room for an exploratory laparotomy. The risks and benefits of the procedure including bleeding, infection, cardiopulmonary difficulties, and the possibility of a blood transfusion were fully explained to the patient, who gave informed consent.  Procedure note:  The patient was placed in supine position. After induction of general endotracheal anesthesia, both percutaneous drains removed without difficulty. The patient also had bowel contents emanating from the midportion of his midline wound. The staples were removed. The abdomen was then prepped and draped using usual sterile technique with Betadine. Surgical site confirmation was performed.  The previous midline incision was opened digitally without difficulty. The mid portion of the fascia was noted to be dehisced with enteric contents emanating from the abdomen. It appeared to come from 2 areas of the small bowel that was just underneath omentum. The bowel wall was dusky and frail. A large pocket of air and fluid was noted in the right side of the abdomen and over the liver dome. This was evacuated using suction. The NG tube was replaced and noted to be appropriate position in the stomach. The bowel proximally and distally to a matted area of involved bowel was inspected. Proximally, I was able to  get up to the ligament of Treitz. One small area of compromise was noted. The bowel was dilated with hypertrophied wall proximal to the matted area. Distal to that, the bowel was completely collapsed. This extended to the terminal ileum. Multiple pockets of clear yellow fluid as well as enteric contents were found. These were evacuated without difficulty. It was elected to proceed with resection of the proximal bowel area of compromise in the mid small bowel area that had multiple areas of previous repair. A GIA 75 stapler was placed proximally and distally along the upper jejunum. A 6 cm segment of small bowel was excised using the LigaSure. A side-to-side anastomosis was then performed using a GIA-75 stapler. The enterotomy was closed using a TA 60 stapler. This was likewise done across the affected segment in the mid small bowel. It was difficult to assess length due to the multiple adhesions present. Viable bowel was found proximally and distally. The affected segment was resected and removed from the operative field. A side-to-side anastomosis was then performed using a GIA-75 stapler. The enterotomy was closed using a TA 60 stapler. The staple line was bolstered using 3-0 silk sutures. The bowel was returned into the abdominal cavity and orderly fashion. The abdominal cavity was then copiously irrigated with warm normal saline until the affluent was clear. A #10 flat Jackson-Pratt drain was placed along the right pericolic gutter up to the liver. It was secured in place using a 3-0 nylon interrupted suture. The bowel was then inspected from ligament of Treitz to the terminal ileum. No serosal tears or anastomotic compromise were noted. The fascia was reapproximated using 0 PDS interrupted sutures. Subcutaneous layer was irrigated  normal saline. The wound was packed with Betadine impregnated gauze. A dry sterile dressing was then applied.  All tape and needle counts were correct at the end of the procedure.  The patient was extubated in the operating room and transferred to the PACU in guarded condition. He will transfer to the stepdown unit postoperatively.  Complications:  None  EBL:  300 mL  Specimen:  Small bowel x 2  Drains:  JP to right paracolic gutter  Blood:  2 units PRBC

## 2017-02-14 NOTE — Anesthesia Preprocedure Evaluation (Signed)
Anesthesia Evaluation  Patient identified by MRN, date of birth, ID band Patient awake    Reviewed: Allergy & Precautions, NPO status , Patient's Chart, lab work & pertinent test results  History of Anesthesia Complications Negative for: history of anesthetic complications  Airway Mallampati: I  TM Distance: >3 FB Neck ROM: Full    Dental  (+) Edentulous Upper, Edentulous Lower   Pulmonary neg pulmonary ROS, former smoker,    Pulmonary exam normal        Cardiovascular hypertension, Pt. on medications  Rhythm:regular Rate:Normal     Neuro/Psych    GI/Hepatic   Endo/Other    Renal/GU Renal InsufficiencyRenal disease     Musculoskeletal  (+) Arthritis ,   Abdominal   Peds  Hematology   Anesthesia Other Findings Bowel perf with leakage, on NG suction.  Reproductive/Obstetrics                             Anesthesia Physical Anesthesia Plan  ASA: III  Anesthesia Plan: General   Post-op Pain Management:    Induction: Intravenous, Rapid sequence and Cricoid pressure planned  Airway Management Planned: Oral ETT  Additional Equipment:   Intra-op Plan:   Post-operative Plan: Extubation in OR  Informed Consent: I have reviewed the patients History and Physical, chart, labs and discussed the procedure including the risks, benefits and alternatives for the proposed anesthesia with the patient or authorized representative who has indicated his/her understanding and acceptance.     Plan Discussed with:   Anesthesia Plan Comments:         Anesthesia Quick Evaluation

## 2017-02-14 NOTE — Progress Notes (Signed)
Nutrition Follow-up  DOCUMENTATION CODES:  Non-severe (moderate) malnutrition in context of acute illness/injury   INTERVENTION:  TPN per pharmacy  RD to continue to periodically reassess for changes in malnutrition status and will help establish diet tolerance/renourish as bowel function returns  NUTRITION DIAGNOSIS:  Altered GI function related to Post op ileus as evidenced by lack of bowel function/need for prolonged tpn.   Ongoing  GOAL:  Patient will meet greater than or equal to 90% of their needs  meeting ~83% kcal needs, 87% Protein  MONITOR:  Diet advancement, Labs, Weight trends, I & O's  ASSESSMENT:  83 y/o male PMHx HTN, Remote L kidney cancer s/p partial nephrectomy. Had recent elective back surgery 3/8. Developed abdominal pain with distension Saturday-Sunday, followed by n/v and poor appetite. Ct shows high grade SBO and AKI . admitted for management.   Interval Hx:  3/30: NGT pulled, Diet advanced to Clear liquids , but later in afternoon, showed distension and no flatus->NGT reinserted, NPO  4/3: NGT again pulled and diet advanced to CL-failed diet-n/v.  4/4: NGT reinserted, NPO, TPN initiated-indication:Bowel obstruction, malnutrition. 4/5 Surgery-exlap+ extensive LOA.  4/6 TPN advanced. Ongoing hypotension. AKI  4/7 Tpn advanced to goal. AKI/ hypotension continues 4/8 Wide Complex Tachycardia. AKI/ hypotension continues 4/9 NGT removed. AKI/ hypotension continues. CT shows large free air, no leakage 4/10: Repeat CT shows leakage. NGT Replaced 4/12: CT guided placement transgluteal and RUQ abscess drains. Had bowel movement? 4/15: BM 4/17 Weaned off TPN in preporation for surgery\ 4/17 BM 4/18: Return to OR for exlap. Had SBR x2  Pt to OR again today. Apparently had portion of distal ileum removed  Has had intermittent BMs per nursing documentation  TPN last given @ half rate 4/16-4/17. Has required increasing insuin  Hopefully can resume  Enteral nutrition in coming days  Meds: Prn Fentanyl/Dilaudid, Insulin (28units in bag) Ativan, KCL,  IVF NS @125 ,  Labs: BUN/Creat 45/1.54. BG currently 70-120-did not receive TPN Last night. Phos Stable. Na: 156   Recent Labs Lab 02/08/17 0530  02/11/17 0645 02/12/17 0425 02/07/2017 0441  NA 143  < > 148* 148* 156*  K 3.2*  < > 3.6 3.6 3.3*  CL 101  < > 106 105 106  CO2 36*  < > 36* 37* 41*  BUN 64*  < > 53* 52* 45*  CREATININE 1.33*  < > 1.39* 1.50* 1.54*  CALCIUM 7.6*  < > 7.5* 7.6* 7.6*  MG 2.1  --   --  2.2  --   PHOS 4.7*  --   --  4.5  --   GLUCOSE 165*  < > 172* 174* 116*  < > = values in this interval not displayed.  Diet Order:     Skin:  Incisions to abdomen  Last BM:  4/17  Height:  Ht Readings from Last 1 Encounters:  01/06/2017 6' (1.829 m)   Weight:  Wt Readings from Last 1 Encounters:  12/30/2016 161 lb 6.4 oz (73.2 kg)   Ideal Body Weight:  80.91 kg  BMI:  Body mass index is 21.89 kg/m.  Estimated Nutritional Needs:  Kcal:  2250-2500 kcals (31-34 kcal/kg bw) Protein:  110-125 g (1.5-1.7 g/kg bw) Fluid:  2.2-2.5 L fluid (1 ml/kcal)  EDUCATION NEEDS:  No education needs identified at this time  Burtis Junes RD, LDN, [facility] Pager: [phone] 02/16/2017 2:03 PM

## 2017-02-14 NOTE — Progress Notes (Signed)
Patient had a CBC, BMET, and Arterial gas drawn while in PACU. Patient had moments of high and low respirations and high and low blood pressures. Dr. Duwayne Heck was present in PACU during patients stay. Dr. Arnoldo Morale came in to reaccess patient. A n ultrasound of left arm was ordered because of swelling and to check PICC line. Results revealed a small thrombosus in left axillary. Patient was transferred to ICU in stable condition.

## 2017-02-15 ENCOUNTER — Inpatient Hospital Stay (HOSPITAL_COMMUNITY): Payer: Medicare Other

## 2017-02-15 LAB — COMPREHENSIVE METABOLIC PANEL
ALBUMIN: 1.6 g/dL — AB (ref 3.5–5.0)
ALT: 21 U/L (ref 17–63)
AST: 24 U/L (ref 15–41)
Alkaline Phosphatase: 46 U/L (ref 38–126)
Anion gap: 7 (ref 5–15)
BUN: 51 mg/dL — AB (ref 6–20)
CHLORIDE: 110 mmol/L (ref 101–111)
CO2: 37 mmol/L — ABNORMAL HIGH (ref 22–32)
Calcium: 7 mg/dL — ABNORMAL LOW (ref 8.9–10.3)
Creatinine, Ser: 2.13 mg/dL — ABNORMAL HIGH (ref 0.61–1.24)
GFR calc Af Amer: 31 mL/min — ABNORMAL LOW (ref >60)
GFR calc non Af Amer: 27 mL/min — ABNORMAL LOW (ref >60)
GLUCOSE: 111 mg/dL — AB (ref 65–99)
POTASSIUM: 3.4 mmol/L — AB (ref 3.5–5.1)
Sodium: 154 mmol/L — ABNORMAL HIGH (ref 135–145)
Total Bilirubin: 2.4 mg/dL — ABNORMAL HIGH (ref 0.3–1.2)
Total Protein: 4.3 g/dL — ABNORMAL LOW (ref 6.5–8.1)

## 2017-02-15 LAB — BASIC METABOLIC PANEL
Anion gap: 8 (ref 5–15)
BUN: 49 mg/dL — AB (ref 6–20)
CHLORIDE: 111 mmol/L (ref 101–111)
CO2: 37 mmol/L — ABNORMAL HIGH (ref 22–32)
CREATININE: 1.99 mg/dL — AB (ref 0.61–1.24)
Calcium: 7 mg/dL — ABNORMAL LOW (ref 8.9–10.3)
GFR calc Af Amer: 34 mL/min — ABNORMAL LOW (ref >60)
GFR calc non Af Amer: 29 mL/min — ABNORMAL LOW (ref >60)
GLUCOSE: 112 mg/dL — AB (ref 65–99)
Potassium: 3.5 mmol/L (ref 3.5–5.1)
SODIUM: 156 mmol/L — AB (ref 135–145)

## 2017-02-15 LAB — CBC
HCT: 28.7 % — ABNORMAL LOW (ref 39.0–52.0)
HEMOGLOBIN: 9.2 g/dL — AB (ref 13.0–17.0)
MCH: 27.5 pg (ref 26.0–34.0)
MCHC: 30.7 g/dL (ref 30.0–36.0)
MCV: 89.7 fL (ref 78.0–100.0)
PLATELETS: 263 10*3/uL (ref 150–400)
RBC: 3.2 MIL/uL — ABNORMAL LOW (ref 4.22–5.81)
RDW: 16.2 % — AB (ref 11.5–15.5)
WBC: 7.3 10*3/uL (ref 4.0–10.5)

## 2017-02-15 LAB — AEROBIC/ANAEROBIC CULTURE W GRAM STAIN (SURGICAL/DEEP WOUND)

## 2017-02-15 LAB — AEROBIC/ANAEROBIC CULTURE (SURGICAL/DEEP WOUND)

## 2017-02-15 LAB — MAGNESIUM: MAGNESIUM: 2 mg/dL (ref 1.7–2.4)

## 2017-02-15 LAB — MRSA PCR SCREENING: MRSA BY PCR: NEGATIVE

## 2017-02-15 LAB — PHOSPHORUS: Phosphorus: 4.6 mg/dL (ref 2.5–4.6)

## 2017-02-15 MED ORDER — SODIUM CHLORIDE 0.9% FLUSH
10.0000 mL | Freq: Two times a day (BID) | INTRAVENOUS | Status: DC
  Administered 2017-02-15 – 2017-02-17 (×5): 10 mL
  Administered 2017-02-17: 30 mL
  Administered 2017-02-18 (×2): 10 mL
  Administered 2017-02-19: 40 mL

## 2017-02-15 MED ORDER — CHLORHEXIDINE GLUCONATE 0.12 % MT SOLN
15.0000 mL | Freq: Two times a day (BID) | OROMUCOSAL | Status: DC
  Administered 2017-02-15 – 2017-02-17 (×6): 15 mL via OROMUCOSAL
  Filled 2017-02-15 (×5): qty 15

## 2017-02-15 MED ORDER — SODIUM CHLORIDE 0.9% FLUSH
10.0000 mL | INTRAVENOUS | Status: DC | PRN
Start: 2017-02-15 — End: 2017-02-19
  Administered 2017-02-18: 30 mL
  Filled 2017-02-15: qty 40

## 2017-02-15 MED ORDER — ALBUMIN HUMAN 25 % IV SOLN
50.0000 g | Freq: Once | INTRAVENOUS | Status: AC
  Administered 2017-02-15: 50 g via INTRAVENOUS
  Filled 2017-02-15: qty 200

## 2017-02-15 MED ORDER — ORAL CARE MOUTH RINSE
15.0000 mL | Freq: Two times a day (BID) | OROMUCOSAL | Status: DC
  Administered 2017-02-15 – 2017-02-17 (×6): 15 mL via OROMUCOSAL

## 2017-02-15 MED ORDER — CHLORHEXIDINE GLUCONATE CLOTH 2 % EX PADS
6.0000 | MEDICATED_PAD | Freq: Every day | CUTANEOUS | Status: DC
  Administered 2017-02-15 – 2017-02-18 (×4): 6 via TOPICAL

## 2017-02-15 NOTE — Progress Notes (Signed)
Dr Arnoldo Morale notified of pt pulling ng tube out and refusing to allow rn to replace. Order to leave ng out for now.

## 2017-02-15 NOTE — Progress Notes (Signed)
1 Day Post-Op  Subjective: Patient denies any significant shortness of breath. He denies any significant abdominal pain.  Objective: Vital signs in last 24 hours: Temp:  [97.7 F (36.5 C)-99.5 F (37.5 C)] 98.4 F (36.9 C) (04/19 0714) Pulse Rate:  [82-112] 102 (04/19 0830) Resp:  [17-50] 27 (04/19 0830) BP: (72-249)/(40-200) 97/69 (04/19 0830) SpO2:  [98 %-100 %] 100 % (04/19 0830) Weight:  [192 lb 14.4 oz (87.5 kg)-196 lb 13.9 oz (89.3 kg)] 192 lb 14.4 oz (87.5 kg) (04/19 0500) Last BM Date: 02/13/17  Intake/Output from previous day: 04/18 0701 - 04/19 0700 In: 7105.4 [I.V.:6135.4; Blood:670; IV VOHYWVPXT:062] Out: 2037 [Urine:1212; Emesis/NG output:225; Drains:100; Blood:300] Intake/Output this shift: Total I/O In: 264.2 [I.V.:264.2] Out: -   General appearance: alert, cooperative and no distress Resp: clear to auscultation bilaterally Cardio: regular rate and rhythm, S1, S2 normal, no murmur, click, rub or gallop GI: Soft, JP drainage serosanguineous in nature. Wound dressing in place.  Lab Results:   Recent Labs  02/02/2017 1427 02/15/17 0412  WBC 9.0 7.3  HGB 12.1* 9.2*  HCT 38.2* 28.7*  PLT 348 263   BMET  Recent Labs  02/15/17 0412 02/15/17 0847  NA 156* 154*  K 3.5 3.4*  CL 111 110  CO2 37* 37*  GLUCOSE 112* 111*  BUN 49* 51*  CREATININE 1.99* 2.13*  CALCIUM 7.0* 7.0*   PT/INR No results for input(s): LABPROT, INR in the last 72 hours.  Studies/Results: US Venous Img Upper Uni Left  Result Date: 02/07/2017 CLINICAL DATA:  83 y/o  M; left upper extremity swelling for 1 day. EXAM: LEFT UPPER EXTREMITY VENOUS DOPPLER ULTRASOUND TECHNIQUE: Gray-scale sonography with graded compression, as well as color Doppler and duplex ultrasound were performed to evaluate the upper extremity deep venous system from the level of the subclavian vein and including the jugular, axillary, basilic, radial, ulnar and upper cephalic vein. Spectral Doppler was utilized to  evaluate flow at rest and with distal augmentation maneuvers. COMPARISON:  None. FINDINGS: Contralateral Subclavian Vein: Respiratory phasicity is normal and symmetric with the symptomatic side. No evidence of thrombus. Normal compressibility. Internal Jugular Vein: No evidence of thrombus. Normal compressibility, respiratory phasicity and response to augmentation. Subclavian Vein: No evidence of thrombus. Normal compressibility, respiratory phasicity and response to augmentation. PICC line. Axillary Vein: Small noncompressible when nonocclusive thrombus present. PICC line. Cephalic Vein: No evidence of thrombus. Normal compressibility, respiratory phasicity and response to augmentation. Basilic Vein: No evidence of thrombus. Normal compressibility, respiratory phasicity and response to augmentation. PICC line. Brachial Veins: No evidence of thrombus. Normal compressibility, respiratory phasicity and response to augmentation. Partially obscured by bandages. Radial Veins: No evidence of thrombus. Normal compressibility, respiratory phasicity and response to augmentation. Ulnar Veins: No evidence of thrombus. Normal compressibility, respiratory phasicity and response to augmentation. Partially obscured by bandages. Venous Reflux:  None visualized. Other Findings:  None visualized. IMPRESSION: Small nonocclusive acute thrombus within the left axillary vein. These results will be called to the ordering clinician or representative by the Radiologist Assistant, and communication documented in the PACS or zVision Dashboard. Electronically Signed   By: Kristine Garbe M.D.   On: 02/24/2017 15:37   Dg Chest Port 1 View  Result Date: 02/13/2017 CLINICAL DATA:  83 year old male status post exploratory laparotomy, lysis of adhesions and small bowel repair EXAM: PORTABLE CHEST 1 VIEW COMPARISON:  02/06/2017 and prior exams FINDINGS: Elevation of the right hemidiaphragm and right lower lung atelectasis noted. There  is no evidence of pneumothorax.  An NG tube entering the stomach with tip off the field of view and left-sided fifth line with tip overlying the lower SVC noted. There is no evidence of pneumothorax. IMPRESSION: Unchanged appearance the chest with continued right lower lung atelectasis. Support apparatus as described. Electronically Signed   By: Margarette Canada M.D.   On: 02/07/2017 14:29    Anti-infectives: Anti-infectives    Start     Dose/Rate Route Frequency Ordered Stop   02/17/2017 1800  piperacillin-tazobactam (ZOSYN) IVPB 3.375 g     3.375 g 12.5 mL/hr over 240 Minutes Intravenous Every 8 hours 02/01/2017 1613     02/06/17 1700  piperacillin-tazobactam (ZOSYN) IVPB 3.375 g  Status:  Discontinued     3.375 g 12.5 mL/hr over 240 Minutes Intravenous Every 8 hours 02/06/17 1629 02/24/2017 1613   02/02/17 0900  cefTRIAXone (ROCEPHIN) 2 g in dextrose 5 % 50 mL IVPB     2 g 100 mL/hr over 30 Minutes Intravenous  Once 01/30/2017 1429 02/02/17 0930   02/14/2017 1730  metroNIDAZOLE (FLAGYL) IVPB 500 mg     500 mg 100 mL/hr over 60 Minutes Intravenous Every 8 hours 02/20/2017 1429 02/02/17 0325   02/21/2017 0822  cefTRIAXone (ROCEPHIN) 2 g in dextrose 5 % 50 mL IVPB     2 g 100 mL/hr over 30 Minutes Intravenous On call to O.R. 01/31/2017 3832 02/11/2017 0920   02/21/2017 9191  metroNIDAZOLE (FLAGYL) IVPB 500 mg  Status:  Discontinued     500 mg 100 mL/hr over 60 Minutes Intravenous Every 8 hours 02/05/2017 0822 01/31/2017 1330      Assessment/Plan: s/p Procedure(s): EXPLORATORY LAPAROTOMY PARTIAL SMALL BOWEL RESECTION X 2 Impression: Stable on postoperative day 1. Hemoglobin is relatively stable. Patient's respiratory status has stabilized. He is hypernatremic, thus will continue LR. Will check albumin level. Renal insufficiency is noted and stable at this time. one of the sons is at bedside and is pleased with results. Continue ICU monitoring. We'll monitor urine output.  LOS: 22 days    Aviva Signs 02/15/2017

## 2017-02-15 NOTE — Addendum Note (Signed)
Addendum  created 02/15/17 [id number] by Ollen Bowl, CRNA   Sign clinical note

## 2017-02-15 NOTE — Anesthesia Postprocedure Evaluation (Signed)
Anesthesia Post Note  Patient: Joshua Downs  Procedure(s) Performed: Procedure(s) (LRB): EXPLORATORY LAPAROTOMY (N/A) PARTIAL SMALL BOWEL RESECTION X 2 (N/A)  Patient location during evaluation: ICU Anesthesia Type: General Level of consciousness: awake and alert Pain management: pain level controlled Vital Signs Assessment: post-procedure vital signs reviewed and stable Respiratory status: spontaneous breathing and patient connected to nasal cannula oxygen Cardiovascular status: stable Postop Assessment: no signs of nausea or vomiting Anesthetic complications: no     Last Vitals:  Vitals:   02/15/17 0612 02/15/17 0714  BP: (!) 94/52   Pulse: 99 100  Resp: (!) 31 (!) 26  Temp:  36.9 C    Last Pain:  Vitals:   02/15/17 0714  TempSrc: Oral  PainSc:                  [name]

## 2017-02-16 ENCOUNTER — Encounter (HOSPITAL_COMMUNITY): Payer: Self-pay | Admitting: General Surgery

## 2017-02-16 LAB — COMPREHENSIVE METABOLIC PANEL
ALBUMIN: 1.9 g/dL — AB (ref 3.5–5.0)
ALT: 19 U/L (ref 17–63)
AST: 21 U/L (ref 15–41)
Alkaline Phosphatase: 37 U/L — ABNORMAL LOW (ref 38–126)
Anion gap: 9 (ref 5–15)
BUN: 55 mg/dL — AB (ref 6–20)
CHLORIDE: 110 mmol/L (ref 101–111)
CO2: 37 mmol/L — ABNORMAL HIGH (ref 22–32)
Calcium: 7.2 mg/dL — ABNORMAL LOW (ref 8.9–10.3)
Creatinine, Ser: 2.39 mg/dL — ABNORMAL HIGH (ref 0.61–1.24)
GFR calc Af Amer: 27 mL/min — ABNORMAL LOW (ref >60)
GFR calc non Af Amer: 23 mL/min — ABNORMAL LOW (ref >60)
GLUCOSE: 105 mg/dL — AB (ref 65–99)
Potassium: 3.3 mmol/L — ABNORMAL LOW (ref 3.5–5.1)
Sodium: 156 mmol/L — ABNORMAL HIGH (ref 135–145)
Total Bilirubin: 1.7 mg/dL — ABNORMAL HIGH (ref 0.3–1.2)
Total Protein: 4.7 g/dL — ABNORMAL LOW (ref 6.5–8.1)

## 2017-02-16 LAB — CBC
HEMATOCRIT: 24.5 % — AB (ref 39.0–52.0)
Hemoglobin: 7.5 g/dL — ABNORMAL LOW (ref 13.0–17.0)
MCH: 27.8 pg (ref 26.0–34.0)
MCHC: 30.6 g/dL (ref 30.0–36.0)
MCV: 90.7 fL (ref 78.0–100.0)
Platelets: 214 10*3/uL (ref 150–400)
RBC: 2.7 MIL/uL — ABNORMAL LOW (ref 4.22–5.81)
RDW: 16.4 % — AB (ref 11.5–15.5)
WBC: 6.5 10*3/uL (ref 4.0–10.5)

## 2017-02-16 MED ORDER — KCL IN DEXTROSE-NACL 20-5-0.45 MEQ/L-%-% IV SOLN
INTRAVENOUS | Status: DC
  Administered 2017-02-16 – 2017-02-17 (×4): via INTRAVENOUS

## 2017-02-16 MED ORDER — BOOST / RESOURCE BREEZE PO LIQD
1.0000 | Freq: Two times a day (BID) | ORAL | Status: DC
  Administered 2017-02-17 – 2017-02-18 (×3): 1 via ORAL

## 2017-02-16 MED ORDER — ENOXAPARIN SODIUM 30 MG/0.3ML ~~LOC~~ SOLN
30.0000 mg | SUBCUTANEOUS | Status: DC
  Administered 2017-02-16 – 2017-02-18 (×3): 30 mg via SUBCUTANEOUS
  Filled 2017-02-16 (×3): qty 0.3

## 2017-02-16 NOTE — Progress Notes (Signed)
Pharmacy Antibiotic Note  Joshua Downs is a 83 y.o. male admitted on 01/25/2017 with intra abdominal infection.  Pharmacy has been consulted for ZOSYN dosing.  Duration of therapy per MD.  Day #10  Plan: Continue Zosyn 3.375g IV q8h (4 hour infusion).  Monitor labs, progress, c/s  Height: 6' (182.9 cm) Weight: 194 lb 14.2 oz (88.4 kg) IBW/kg (Calculated) : 77.6  Temp (24hrs), Avg:98.1 F (36.7 C), Min:97.5 F (36.4 C), Max:98.7 F (37.1 C)   Recent Labs Lab 02/12/17 0425 01/28/2017 0441 02/02/2017 1427 02/15/17 0412 02/15/17 0847 02/16/17 0402  WBC 10.6*  --  9.0 7.3  --  6.5  CREATININE 1.50* 1.54* 1.68* 1.99* 2.13* 2.39*    Estimated Creatinine Clearance: 25.3 mL/min (A) (by C-G formula based on SCr of 2.39 mg/dL (H)).    Allergies  Allergen Reactions  . No Known Allergies    Antimicrobials this admission:  Zosyn 4/10 >>  Dose adjustments this admission:  n/a   Recent Results (from the past 240 hour(s))  Aerobic/Anaerobic Culture (surgical/deep wound)     Status: None   Collection Time: 02/08/17  2:10 PM  Result Value Ref Range Status   Specimen Description ABSCESS  Final   Special Requests TRANSGLUTEAL DRAIN  Final   Gram Stain   Final    FEW WBC PRESENT, PREDOMINANTLY PMN NO ORGANISMS SEEN    Culture No growth aerobically or anaerobically.  Final   Report Status 02/13/2017 FINAL  Final  Aerobic/Anaerobic Culture (surgical/deep wound)     Status: Abnormal   Collection Time: 02/08/17  2:11 PM  Result Value Ref Range Status   Specimen Description ABSCESS  Final   Special Requests RUQ PERIHEPATIC DRAIN  Final   Gram Stain   Final    ABUNDANT WBC PRESENT, PREDOMINANTLY PMN NO ORGANISMS SEEN    Culture (A)  Final    MULTIPLE ORGANISMS PRESENT, NONE PREDOMINANT NO ANAEROBES ISOLATED    Report Status 02/15/2017 FINAL  Final  MRSA PCR Screening     Status: None   Collection Time: 01/30/2017  4:26 PM  Result Value Ref Range Status   MRSA by PCR NEGATIVE  NEGATIVE Final    Comment:        The GeneXpert MRSA Assay (FDA approved for NASAL specimens only), is one component of a comprehensive MRSA colonization surveillance program. It is not intended to diagnose MRSA infection nor to guide or monitor treatment for MRSA infections.    Thank you for allowing pharmacy to be a part of this patient's care.  Hart Robinsons A 02/16/2017 10:45 AM

## 2017-02-16 NOTE — Progress Notes (Signed)
2 Days Post-Op  Subjective: Patient alert and oriented this morning. Denies any shortness of breath. Has been passing flatus. Patient removed the NG tube yesterday evening. No nausea or vomiting since that time.  Objective: Vital signs in last 24 hours: Temp:  [97.5 F (36.4 C)-98.7 F (37.1 C)] 97.5 F (36.4 C) (04/20 0700) Pulse Rate:  [78-111] 78 (04/20 0600) Resp:  [21-34] 23 (04/20 0600) BP: (89-131)/(52-89) 112/59 (04/20 0600) SpO2:  [83 %-100 %] 100 % (04/20 0600) Weight:  [194 lb 14.2 oz (88.4 kg)] 194 lb 14.2 oz (88.4 kg) (04/20 0500) Last BM Date: 02/13/17  Intake/Output from previous day: 04/19 0701 - 04/20 0700 In: 3174.6 [I.V.:3074.6; IV Piggyback:100] Out: 885 [Urine:700; Drains:185] Intake/Output this shift: No intake/output data recorded.  General appearance: alert, cooperative and no distress Resp: clear to auscultation bilaterally Cardio: regular rate and rhythm, S1, S2 normal, no murmur, click, rub or gallop GI: Soft, incision healing well without purulent drainage. JP drainage serosanguineous in nature. Bowel sounds active.  Lab Results:   Recent Labs  02/15/17 0412 02/16/17 0402  WBC 7.3 6.5  HGB 9.2* 7.5*  HCT 28.7* 24.5*  PLT 263 214   BMET  Recent Labs  02/15/17 0847 02/16/17 0402  NA 154* 156*  K 3.4* 3.3*  CL 110 110  CO2 37* 37*  GLUCOSE 111* 105*  BUN 51* 55*  CREATININE 2.13* 2.39*  CALCIUM 7.0* 7.2*   PT/INR No results for input(s): LABPROT, INR in the last 72 hours.  Studies/Results: US Venous Img Upper Uni Left  Result Date: 01/29/2017 CLINICAL DATA:  83 y/o  M; left upper extremity swelling for 1 day. EXAM: LEFT UPPER EXTREMITY VENOUS DOPPLER ULTRASOUND TECHNIQUE: Gray-scale sonography with graded compression, as well as color Doppler and duplex ultrasound were performed to evaluate the upper extremity deep venous system from the level of the subclavian vein and including the jugular, axillary, basilic, radial, ulnar and  upper cephalic vein. Spectral Doppler was utilized to evaluate flow at rest and with distal augmentation maneuvers. COMPARISON:  None. FINDINGS: Contralateral Subclavian Vein: Respiratory phasicity is normal and symmetric with the symptomatic side. No evidence of thrombus. Normal compressibility. Internal Jugular Vein: No evidence of thrombus. Normal compressibility, respiratory phasicity and response to augmentation. Subclavian Vein: No evidence of thrombus. Normal compressibility, respiratory phasicity and response to augmentation. PICC line. Axillary Vein: Small noncompressible when nonocclusive thrombus present. PICC line. Cephalic Vein: No evidence of thrombus. Normal compressibility, respiratory phasicity and response to augmentation. Basilic Vein: No evidence of thrombus. Normal compressibility, respiratory phasicity and response to augmentation. PICC line. Brachial Veins: No evidence of thrombus. Normal compressibility, respiratory phasicity and response to augmentation. Partially obscured by bandages. Radial Veins: No evidence of thrombus. Normal compressibility, respiratory phasicity and response to augmentation. Ulnar Veins: No evidence of thrombus. Normal compressibility, respiratory phasicity and response to augmentation. Partially obscured by bandages. Venous Reflux:  None visualized. Other Findings:  None visualized. IMPRESSION: Small nonocclusive acute thrombus within the left axillary vein. These results will be called to the ordering clinician or representative by the Radiologist Assistant, and communication documented in the PACS or zVision Dashboard. Electronically Signed   By: Kristine Garbe M.D.   On: 02/24/2017 15:37   Dg Chest Port 1 View  Result Date: 02/15/2017 CLINICAL DATA:  Left IJ line placement EXAM: PORTABLE CHEST 1 VIEW COMPARISON:  02/22/2017 FINDINGS: Low lung volumes with mild right basilar atelectasis. No pneumothorax. The heart is normal in size.  Thoracic aortic  atherosclerosis. Left  arm PICC terminates in the upper right atrium. Left IJ catheter extends more inferiorly than is typically seen and overlies the aortic arch. Enteric tube courses into the proximal gastric body. IMPRESSION: Left IJ catheter extends more inferiorly than is typically seen and overlies the aortic arch. While this may terminate in the mid left brachiocephalic vein, arterial placement should be excluded clinically. Additional support apparatus as above. These results will be called to the ordering clinician or representative by the Radiologist Assistant, and communication documented in the PACS or zVision Dashboard. Electronically Signed   By: Julian Hy M.D.   On: 02/15/2017 11:56   Dg Chest Port 1 View  Result Date: 01/30/2017 CLINICAL DATA:  83 year old male status post exploratory laparotomy, lysis of adhesions and small bowel repair EXAM: PORTABLE CHEST 1 VIEW COMPARISON:  02/06/2017 and prior exams FINDINGS: Elevation of the right hemidiaphragm and right lower lung atelectasis noted. There is no evidence of pneumothorax. An NG tube entering the stomach with tip off the field of view and left-sided fifth line with tip overlying the lower SVC noted. There is no evidence of pneumothorax. IMPRESSION: Unchanged appearance the chest with continued right lower lung atelectasis. Support apparatus as described. Electronically Signed   By: Margarette Canada M.D.   On: 02/01/2017 14:29   Dg Chest Port 1v Same Day  Result Date: 02/15/2017 CLINICAL DATA:  Central line placement.  Verify NGT positioning EXAM: PORTABLE CHEST 1 VIEW COMPARISON:  Earlier today FINDINGS: Right-sided central line has been placed, tip at the upper cavoatrial junction. Unchanged positioning of left upper extremity PICC with tip at the cavoatrial junction or upper right atrium. Left neck catheter as previously described, distally catheter not closely completely following the PICC. Nasogastric tube in good position, with  side port seen at the stomach. Low volume chest with hazy opacities. Right more than left diaphragm is elevated. Gas beneath the diaphragm, where there is also surgical drain. Cardiomegaly. IMPRESSION: 1. New central line with tip at the upper cavoatrial junction. No pneumothorax. 2. Elevated right diaphragm with atelectasis and pleural effusion based on most recent CT. 3. Nasogastric tube in stable good position. 4. Unchanged positioning of left-sided vascular catheters. 5. Probable pneumoperitoneum, stable. Electronically Signed   By: Monte Fantasia M.D.   On: 02/15/2017 14:40   Dg Chest Port 1v Same Day  Result Date: 02/15/2017 CLINICAL DATA:  Post PICC line placement EXAM: PORTABLE CHEST 1 VIEW COMPARISON:  02/15/2017 FINDINGS: Cardiomediastinal silhouette is stable. Persistent right base medially atelectasis or infiltrate. Stable NG tube position. Stable left arm PICC line position with tip in right atrium. There is no pneumothorax. Again noted unusual position of left IJ catheter tip with tip overlying the aortic arch. Arterial position or hemi azygous position cannot be excluded. Clinical correlation is necessary. IMPRESSION: Persistent right base medially atelectasis or infiltrate. Stable NG tube position. Stable left arm PICC line position with tip in right atrium. There is no pneumothorax. Again noted unusual position of left IJ catheter tip with tip overlying the aortic arch. Arterial position or hemi azygous position cannot be excluded. Clinical correlation is necessary. Electronically Signed   By: Lahoma Crocker M.D.   On: 02/15/2017 13:00    Anti-infectives: Anti-infectives    Start     Dose/Rate Route Frequency Ordered Stop   02/04/2017 1800  piperacillin-tazobactam (ZOSYN) IVPB 3.375 g     3.375 g 12.5 mL/hr over 240 Minutes Intravenous Every 8 hours 02/05/2017 1613     02/06/17 1700  piperacillin-tazobactam (ZOSYN) IVPB 3.375 g  Status:  Discontinued     3.375 g 12.5 mL/hr over 240 Minutes  Intravenous Every 8 hours 02/06/17 1629 02/22/2017 1613   02/02/17 0900  cefTRIAXone (ROCEPHIN) 2 g in dextrose 5 % 50 mL IVPB     2 g 100 mL/hr over 30 Minutes Intravenous  Once 02/06/2017 1429 02/02/17 0930   01/30/2017 1730  metroNIDAZOLE (FLAGYL) IVPB 500 mg     500 mg 100 mL/hr over 60 Minutes Intravenous Every 8 hours 02/03/2017 1429 02/02/17 0325   02/15/2017 0822  cefTRIAXone (ROCEPHIN) 2 g in dextrose 5 % 50 mL IVPB     2 g 100 mL/hr over 30 Minutes Intravenous On call to O.R. 02/10/2017 1610 02/23/2017 0920   02/03/2017 9604  metroNIDAZOLE (FLAGYL) IVPB 500 mg  Status:  Discontinued     500 mg 100 mL/hr over 60 Minutes Intravenous Every 8 hours 02/11/2017 0822 02/26/2017 1330      Assessment/Plan: s/p Procedure(s): EXPLORATORY LAPAROTOMY PARTIAL SMALL BOWEL RESECTION X 2 Impression: Stable on postoperative day 2. His renal insufficiency has worsened, though his urine output has increased. We will adjust IV fluids due to urine output and hypernatremia. Hemoglobin has dropped, though I doubt he is actively bleeding. No need for blood transfusion at this time. He does have blood in his urine, thus will leave the Foley in. Need to keep the Foley in to monitor strict I's and O's and to monitor the hematuria. He is not stable enough to fully anticoagulate for his left arm DVT. Left arm swelling still present. The PICC line is being removed. Bowel function starting to return.  LOS: 23 days    Aviva Signs 02/16/2017

## 2017-02-16 NOTE — Progress Notes (Signed)
While giving patient CHG bath and performing foley care, RN noticed patient's scrotum area is swollen and a little blood is oozing from around foley. Patient's UOP has been bright red today, which is a change from yesterday's urine color. MD is aware of this.  Dr. Arnoldo Morale came to remove patient's PICC in upper Left arm. No complications noted. Patient's Left arm is more swollen today than yesterday and is warm to touch. Patient complains of no pain. RN of patient not certified to remove PICC line, and other RN's on unit were not comfortable due to fact patient has a clot in the Left arm.  Wound dressing changed performed and documented. No complications or sign of infection.  Patient has not had a BM since before surgery on 4/18. Nutrition will like to start patient on a diet tomorrow. Patient is hoping to eat as well. Tolerating ice chips well.  Will continue to monitor patient  Margaret Pyle, RN

## 2017-02-16 NOTE — Progress Notes (Signed)
Patient briefly seen today. Tonight will mark the 4th day He has gone 4 days w/o nutrition and 5th day since his tpn was given at 50% rate.   Spoke with RN who stated surgeon may place on CL tomorrow as the pt is tolerating his ice chips and passing flatus.   RD stated to patient he would touch base with him next week to work on weaning him back on an oral diet. He should take it slow with the liquids. He says he has an appetite and no nausea and vomiting.   Given CL diet is very low in protein. Will preemtpively order Boost Breeze to increase protein. with instructions to only SIP slowly,  to reduce likelihood of osmotic diarrhea. Prostat may be too concentrated and provoke GI upset to give initially.   RD to monitor as PO diet resumes.   Burtis Junes RD, LDN, CNSC Clinical Nutrition Pager: [phone] 02/16/2017 4:05 PM

## 2017-02-16 NOTE — Progress Notes (Signed)
DR Arnoldo Morale CALLED AND NOTIFIED OF BLOODY URINE DRAINING IN FOLEY BAG. ALSO INFORMED THAT PICC LINE WILL NEED TO BE REMOVED BY Hulan Amato JARRELL RN/AC THIS AM.

## 2017-02-16 NOTE — Progress Notes (Signed)
Upon reassessment RN noticed patient right side of abdomen seemed to be a little firmer to touch than the left side. Pt complains of no pain even when pressing on the stomach. Bowel sounds are faint and hypoactive. Also note some crackles in base of lungs.   Will continue to monitor   Margaret Pyle, RN

## 2017-02-17 LAB — CBC
HCT: 25.6 % — ABNORMAL LOW (ref 39.0–52.0)
Hemoglobin: 7.7 g/dL — ABNORMAL LOW (ref 13.0–17.0)
MCH: 27.3 pg (ref 26.0–34.0)
MCHC: 30.1 g/dL (ref 30.0–36.0)
MCV: 90.8 fL (ref 78.0–100.0)
PLATELETS: 250 10*3/uL (ref 150–400)
RBC: 2.82 MIL/uL — AB (ref 4.22–5.81)
RDW: 16.1 % — AB (ref 11.5–15.5)
WBC: 8.8 10*3/uL (ref 4.0–10.5)

## 2017-02-17 LAB — TYPE AND SCREEN
ABO/RH(D): A POS
ANTIBODY SCREEN: NEGATIVE
UNIT DIVISION: 0
Unit division: 0
Unit division: 0
Unit division: 0

## 2017-02-17 LAB — BASIC METABOLIC PANEL
ANION GAP: 7 (ref 5–15)
BUN: 58 mg/dL — ABNORMAL HIGH (ref 6–20)
CALCIUM: 7.2 mg/dL — AB (ref 8.9–10.3)
CO2: 36 mmol/L — ABNORMAL HIGH (ref 22–32)
Chloride: 111 mmol/L (ref 101–111)
Creatinine, Ser: 2.74 mg/dL — ABNORMAL HIGH (ref 0.61–1.24)
GFR, EST AFRICAN AMERICAN: 23 mL/min — AB (ref >60)
GFR, EST NON AFRICAN AMERICAN: 20 mL/min — AB (ref >60)
Glucose, Bld: 190 mg/dL — ABNORMAL HIGH (ref 65–99)
POTASSIUM: 3.6 mmol/L (ref 3.5–5.1)
Sodium: 154 mmol/L — ABNORMAL HIGH (ref 135–145)

## 2017-02-17 LAB — BPAM RBC
BLOOD PRODUCT EXPIRATION DATE: 201805112359
BLOOD PRODUCT EXPIRATION DATE: 201805112359
Blood Product Expiration Date: 201805112359
Blood Product Expiration Date: 201805112359
ISSUE DATE / TIME: 201804181125
ISSUE DATE / TIME: 201804181143
UNIT TYPE AND RH: 6200
UNIT TYPE AND RH: 6200
Unit Type and Rh: 6200
Unit Type and Rh: 6200

## 2017-02-17 NOTE — Progress Notes (Signed)
3 Days Post-Op  Subjective: Patient comfortable. Denies any shortness of breath.  Objective: Vital signs in last 24 hours: Temp:  [97.4 F (36.3 C)-98.1 F (36.7 C)] 97.6 F (36.4 C) (04/21 0400) Pulse Rate:  [81-104] 95 (04/21 0700) Resp:  [13-30] 30 (04/21 0700) BP: (107-131)/(49-91) 131/75 (04/21 0700) SpO2:  [88 %-100 %] 89 % (04/21 0700) Weight:  [202 lb 2.6 oz (91.7 kg)] 202 lb 2.6 oz (91.7 kg) (04/21 0400) Last BM Date: 02/13/2017  Intake/Output from previous day: 04/20 0701 - 04/21 0700 In: 3086.7 [I.V.:2536.7; IV Piggyback:550] Out: 2395 [Urine:2175; Drains:220] Intake/Output this shift: No intake/output data recorded.  General appearance: alert, cooperative and no distress Resp: clear to auscultation bilaterally Cardio: regular rate and rhythm, S1, S2 normal, no murmur, click, rub or gallop GI: Soft, active bowel sounds appreciated. Dressing intact. JP drainage serosanguineous in nature.  Lab Results:   Recent Labs  02/16/17 0402 02/17/17 0010  WBC 6.5 8.8  HGB 7.5* 7.7*  HCT 24.5* 25.6*  PLT 214 250   BMET  Recent Labs  02/16/17 0402 02/17/17 0010  NA 156* 154*  K 3.3* 3.6  CL 110 111  CO2 37* 36*  GLUCOSE 105* 190*  BUN 55* 58*  CREATININE 2.39* 2.74*  CALCIUM 7.2* 7.2*   PT/INR No results for input(s): LABPROT, INR in the last 72 hours.  Studies/Results: Dg Chest Port 1 View  Result Date: 02/15/2017 CLINICAL DATA:  Left IJ line placement EXAM: PORTABLE CHEST 1 VIEW COMPARISON:  02/15/2017 FINDINGS: Low lung volumes with mild right basilar atelectasis. No pneumothorax. The heart is normal in size.  Thoracic aortic atherosclerosis. Left arm PICC terminates in the upper right atrium. Left IJ catheter extends more inferiorly than is typically seen and overlies the aortic arch. Enteric tube courses into the proximal gastric body. IMPRESSION: Left IJ catheter extends more inferiorly than is typically seen and overlies the aortic arch. While this may  terminate in the mid left brachiocephalic vein, arterial placement should be excluded clinically. Additional support apparatus as above. These results will be called to the ordering clinician or representative by the Radiologist Assistant, and communication documented in the PACS or zVision Dashboard. Electronically Signed   By: Julian Hy M.D.   On: 02/15/2017 11:56   Dg Chest Port 1v Same Day  Result Date: 02/15/2017 CLINICAL DATA:  Central line placement.  Verify NGT positioning EXAM: PORTABLE CHEST 1 VIEW COMPARISON:  Earlier today FINDINGS: Right-sided central line has been placed, tip at the upper cavoatrial junction. Unchanged positioning of left upper extremity PICC with tip at the cavoatrial junction or upper right atrium. Left neck catheter as previously described, distally catheter not closely completely following the PICC. Nasogastric tube in good position, with side port seen at the stomach. Low volume chest with hazy opacities. Right more than left diaphragm is elevated. Gas beneath the diaphragm, where there is also surgical drain. Cardiomegaly. IMPRESSION: 1. New central line with tip at the upper cavoatrial junction. No pneumothorax. 2. Elevated right diaphragm with atelectasis and pleural effusion based on most recent CT. 3. Nasogastric tube in stable good position. 4. Unchanged positioning of left-sided vascular catheters. 5. Probable pneumoperitoneum, stable. Electronically Signed   By: Monte Fantasia M.D.   On: 02/15/2017 14:40   Dg Chest Port 1v Same Day  Result Date: 02/15/2017 CLINICAL DATA:  Post PICC line placement EXAM: PORTABLE CHEST 1 VIEW COMPARISON:  02/15/2017 FINDINGS: Cardiomediastinal silhouette is stable. Persistent right base medially atelectasis or infiltrate. Stable NG  tube position. Stable left arm PICC line position with tip in right atrium. There is no pneumothorax. Again noted unusual position of left IJ catheter tip with tip overlying the aortic arch.  Arterial position or hemi azygous position cannot be excluded. Clinical correlation is necessary. IMPRESSION: Persistent right base medially atelectasis or infiltrate. Stable NG tube position. Stable left arm PICC line position with tip in right atrium. There is no pneumothorax. Again noted unusual position of left IJ catheter tip with tip overlying the aortic arch. Arterial position or hemi azygous position cannot be excluded. Clinical correlation is necessary. Electronically Signed   By: Lahoma Crocker M.D.   On: 02/15/2017 13:00    Anti-infectives: Anti-infectives    Start     Dose/Rate Route Frequency Ordered Stop   01/31/2017 1800  piperacillin-tazobactam (ZOSYN) IVPB 3.375 g     3.375 g 12.5 mL/hr over 240 Minutes Intravenous Every 8 hours 02/06/2017 1613     02/06/17 1700  piperacillin-tazobactam (ZOSYN) IVPB 3.375 g  Status:  Discontinued     3.375 g 12.5 mL/hr over 240 Minutes Intravenous Every 8 hours 02/06/17 1629 02/21/2017 1613   02/02/17 0900  cefTRIAXone (ROCEPHIN) 2 g in dextrose 5 % 50 mL IVPB     2 g 100 mL/hr over 30 Minutes Intravenous  Once 02/10/2017 1429 02/02/17 0930   02/16/2017 1730  metroNIDAZOLE (FLAGYL) IVPB 500 mg     500 mg 100 mL/hr over 60 Minutes Intravenous Every 8 hours 02/05/2017 1429 02/02/17 0325   02/14/2017 0822  cefTRIAXone (ROCEPHIN) 2 g in dextrose 5 % 50 mL IVPB     2 g 100 mL/hr over 30 Minutes Intravenous On call to O.R. 02/08/2017 6812 02/09/2017 0920   02/20/2017 7517  metroNIDAZOLE (FLAGYL) IVPB 500 mg  Status:  Discontinued     500 mg 100 mL/hr over 60 Minutes Intravenous Every 8 hours 02/13/2017 0822 02/18/2017 1330      Assessment/Plan: s/p Procedure(s): EXPLORATORY LAPAROTOMY PARTIAL SMALL BOWEL RESECTION X 2 Impression: Stable on postoperative day 3. Bowel function has returned. Urine output has improved. Still with renal insufficiency. Will advance to full liquid diet. Continue ICU monitoring for right now.  LOS: 24 days    Aviva Signs 02/17/2017

## 2017-02-17 NOTE — Plan of Care (Signed)
Problem: Urinary Elimination: Goal: Ability to achieve and maintain adequate urine output will improve Outcome: Not Progressing FOLEY CATH BEGAN DRAINING BLOODY URINE AT END OF POST OP DAY 2. FOLEY REMOVED POST OP DAY 3 D/T BLOOD CLOTS IN URINE. PT CONTINUES TO VOID BLOOD TINGED URINE.Marland Kitchen

## 2017-02-17 NOTE — Progress Notes (Signed)
Pt having frequent painful urination with bright red blood. Patient localizes pain to right groin. Black stools noted. Dr Arnoldo Morale notified. No new orders given, will continue to monitor. Georgina Peer, RN 02/17/2017 12:48 PM

## 2017-02-17 NOTE — Progress Notes (Signed)
Requesting to be allowed to get oob today.

## 2017-02-17 NOTE — Progress Notes (Signed)
Patients daughter called requesting an update on her fathers status .  Patient's daughter provided a overview of the patients day and most recent changes since her update last update. This update included general appearance, vital signs, lab work, and general medical plan for the day.  Manuela Schwartz became loud and yelling while on phone after being told that her father was on aspiration precautions during the update. She wanted to know why there would be any concern for her father swallowing while hospitalized. Manuela Schwartz was informed that the patient was post SBO surgery, previous removal of an NG tube less than two days ago, and deconditioned throughout this process, which would warrant aspiration precautions with the advancement of clear liquids as a general preventive measure for safety. Manuela Schwartz than stated that I am a nurse, and don't tell what is needed to take care of my father. I know what he needs, and I don't think anything in his history warrants aspiration precaution. The daughter then hung up the phone up while talking to her.

## 2017-02-17 NOTE — Progress Notes (Signed)
PtT HAS HAD 5 BROWN LIQUIDS STOOLS TONIGHT.  PT'S FOLEY CONINUED TO DRAIN BLOODY URINE. PT BEGAN TO C/O NEED TO VOID AND NOT BEING ABLE TO .FOLEY FLUSHED AND SEVERAL BLOOD CLOTS REMOVED. PT'S DISCOMFORT CONTINUED AND FOLY D/C'D PRE 2 DAY POST-OP PROTOCOL. DISCOMFORT RELIEVED. PT CONTINUES TO VOID BLOODY URINE W/ FRQUENT BLOOD CLOTS. UNABLE TO KEEP CONDOM CATH IN PLACE D/T PT'S BODY HABITUS.

## 2017-02-18 ENCOUNTER — Inpatient Hospital Stay (HOSPITAL_COMMUNITY): Payer: Medicare Other

## 2017-02-18 LAB — BASIC METABOLIC PANEL
ANION GAP: 5 (ref 5–15)
BUN: 63 mg/dL — ABNORMAL HIGH (ref 6–20)
CALCIUM: 7.2 mg/dL — AB (ref 8.9–10.3)
CHLORIDE: 112 mmol/L — AB (ref 101–111)
CO2: 36 mmol/L — ABNORMAL HIGH (ref 22–32)
CREATININE: 3.1 mg/dL — AB (ref 0.61–1.24)
GFR calc non Af Amer: 17 mL/min — ABNORMAL LOW (ref >60)
GFR, EST AFRICAN AMERICAN: 20 mL/min — AB (ref >60)
Glucose, Bld: 187 mg/dL — ABNORMAL HIGH (ref 65–99)
Potassium: 4.1 mmol/L (ref 3.5–5.1)
SODIUM: 153 mmol/L — AB (ref 135–145)

## 2017-02-18 LAB — CBC
HCT: 24.4 % — ABNORMAL LOW (ref 39.0–52.0)
HEMOGLOBIN: 7.1 g/dL — AB (ref 13.0–17.0)
MCH: 27.7 pg (ref 26.0–34.0)
MCHC: 29.1 g/dL — ABNORMAL LOW (ref 30.0–36.0)
MCV: 95.3 fL (ref 78.0–100.0)
PLATELETS: 218 10*3/uL (ref 150–400)
RBC: 2.56 MIL/uL — AB (ref 4.22–5.81)
RDW: 15.9 % — ABNORMAL HIGH (ref 11.5–15.5)
WBC: 8.2 10*3/uL (ref 4.0–10.5)

## 2017-02-18 LAB — SODIUM, URINE, RANDOM: SODIUM UR: 61 mmol/L

## 2017-02-18 LAB — CREATININE, URINE, RANDOM: Creatinine, Urine: 39.17 mg/dL

## 2017-02-18 MED ORDER — FUROSEMIDE 10 MG/ML IJ SOLN: 200.0000 mg | Freq: Two times a day (BID) | INTRAVENOUS | Status: DC

## 2017-02-18 MED ORDER — FLUCONAZOLE IN SODIUM CHLORIDE 100-0.9 MG/50ML-% IV SOLN
100.0000 mg | Freq: Once | INTRAVENOUS | Status: AC
  Administered 2017-02-18: 100 mg via INTRAVENOUS
  Filled 2017-02-18: qty 50

## 2017-02-18 MED ORDER — DEXTROSE 5 % IV SOLN
INTRAVENOUS | Status: DC
  Administered 2017-02-18 – 2017-02-19 (×3): via INTRAVENOUS

## 2017-02-18 MED ORDER — FLUCONAZOLE 100 MG PO TABS
50.0000 mg | ORAL_TABLET | Freq: Every day | ORAL | Status: DC
Start: 2017-02-19 — End: 2017-02-19

## 2017-02-18 MED ORDER — ALBUMIN HUMAN 25 % IV SOLN
25.0000 g | Freq: Two times a day (BID) | INTRAVENOUS | Status: DC
  Administered 2017-02-18 – 2017-02-19 (×2): 25 g via INTRAVENOUS
  Filled 2017-02-18 (×4): qty 100

## 2017-02-18 MED ORDER — FUROSEMIDE 10 MG/ML IJ SOLN
60.0000 mg | Freq: Two times a day (BID) | INTRAMUSCULAR | Status: DC
  Administered 2017-02-18 – 2017-02-19 (×3): 60 mg via INTRAVENOUS
  Filled 2017-02-18 (×3): qty 6

## 2017-02-18 MED ORDER — MAGIC MOUTHWASH
10.0000 mL | Freq: Four times a day (QID) | ORAL | Status: DC
  Administered 2017-02-18 (×3): 10 mL via ORAL
  Filled 2017-02-18 (×3): qty 10

## 2017-02-18 NOTE — Consult Note (Signed)
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 male.  HPI: He is a patient well has history of degenerative joint disease status post back surgery, history of renal CA status post partial left kidney nephrectomy, chronic renal failure stage III presently came with complaints of abdominal pain and patient was found to have a small bowel obstruction. Presently patient is status post exploratory laparotomy and small bowel partial resection 2. Consult is called because of worsening of his renal failure. Patient presently denies any nausea or vomiting. His appetite is poor but improving. Patient also denies any difficulty in breathing.  Past Medical History:  Diagnosis Date  . Arthritis   . Bradycardia    says normal HR runs about 40-44  . Cancer [facility]) 2004   left kidney  . Chronic back pain    DDD/stenosis  . Constipation    takes Dulcolax daily as needed as well as Colace  . Dysrhythmia    right BBB  . Enlarged prostate   . History of colon polyps   . History of kidney stones   . Hypertension    takes Lotensin daily  . Insomnia    takes Melatonin nightly as well as Benadryl  . Joint swelling   . Macular degeneration    takes Ocuvite and Fish Oil;dry  . Nocturia   . Numbness    in hands and feet  . Weakness    and heaviness;notices more in left but some in right    Past Surgical History:  Procedure Laterality Date  . BACK SURGERY  2015  . BACK SURGERY  10/2012  . BOWEL RESECTION N/A 02/16/2017   Procedure: PARTIAL SMALL BOWEL RESECTION X 2;  Surgeon: Aviva Signs, MD;  Location: [facility];  Service: General;  Laterality: N/A;  . CARPAL TUNNEL RELEASE Right 02/18/2015   Procedure: RIGHT WRIST CARPAL TUNNEL RELEASE;  Surgeon: Daryll Brod, MD;  Location: [place];  Service: Orthopedics;  Laterality: Right;  . CARPAL TUNNEL RELEASE Left 04/13/2015   Procedure: LEFT CARPAL TUNNEL RELEASE;   Surgeon: Daryll Brod, MD;  Location: [place];  Service: Orthopedics;  Laterality: Left;  . COLONOSCOPY    . ESOPHAGOGASTRODUODENOSCOPY N/A 07/06/2016   Procedure: ESOPHAGOGASTRODUODENOSCOPY (EGD);  Surgeon: Rogene Houston, MD;  [facility];  Service: Endoscopy;  Laterality: N/A;  3:05  . HERNIA REPAIR    . LAPAROTOMY N/A 02/26/2017   Procedure: EXPLORATORY LAPAROTOMY;  Surgeon: Vickie Epley, MD;  Location: [facility];  Service: General;  Laterality: N/A;  . LAPAROTOMY N/A 02/21/2017   Procedure: EXPLORATORY LAPAROTOMY;  Surgeon: Aviva Signs, MD;  Location: [facility];  Service: General;  Laterality: N/A;  . left kidney portion removed  2004  . Left partial nephrectomy     2004, kidney cancer  . LUMBAR LAMINECTOMY/DECOMPRESSION MICRODISCECTOMY Left 01/04/2017   Procedure: LUMBAR ONE - LUMBAR TWO  LAMINOTOMY AND MICRODISCECTOMY;  Surgeon: Jovita Gamma, MD;  Location: [place];  Service: Neurosurgery;  Laterality: Left;  . LYSIS OF ADHESION N/A 02/09/2017   Procedure: LYSIS OF ADHESION;  Surgeon: Vickie Epley, MD;  Location: [facility];  Service: General;  Laterality: N/A;  . PARATHYROIDECTOMY    . spot taken off of back  2008  . ULNAR NERVE TRANSPOSITION Right 02/18/2015   Procedure: DECOMPRESSION ULNAR NERVE RIGHT ELBOW;  Surgeon: Daryll Brod, MD;  Location: [place];  Service: Orthopedics;  Laterality:  Right;    Family History  Problem Relation Age of Onset  . Dementia Mother   . Cancer Father   . Hypertension Sister   . Multiple myeloma Son     Social History:  reports that he has quit smoking. His smoking use included Cigars and Pipe. He quit after 20.00 years of use. His smokeless tobacco use includes Chew. He reports that he does not drink alcohol or use drugs.  Allergies:  Allergies  Allergen Reactions  . No Known Allergies     Medications: I have reviewed the patient's current medications.  Results for orders placed or performed during  the hospital encounter of 01/17/2017 (from the past 48 hour(s))  CBC     Status: Abnormal   Collection Time: 02/17/17 12:10 AM  Result Value Ref Range   WBC 8.8 4.0 - 10.5 K/uL   RBC 2.82 (L) 4.22 - 5.81 MIL/uL   Hemoglobin 7.7 (L) 13.0 - 17.0 g/dL   HCT 25.6 (L) 39.0 - 52.0 %   MCV 90.8 78.0 - 100.0 fL   MCH 27.3 26.0 - 34.0 pg   MCHC 30.1 30.0 - 36.0 g/dL   RDW 16.1 (H) 11.5 - 15.5 %   Platelets 250 150 - 400 K/uL  Basic metabolic panel     Status: Abnormal   Collection Time: 02/17/17 12:10 AM  Result Value Ref Range   Sodium 154 (H) 135 - 145 mmol/L   Potassium 3.6 3.5 - 5.1 mmol/L   Chloride 111 101 - 111 mmol/L   CO2 36 (H) 22 - 32 mmol/L   Glucose, Bld 190 (H) 65 - 99 mg/dL   BUN 58 (H) 6 - 20 mg/dL   Creatinine, Ser 2.74 (H) 0.61 - 1.24 mg/dL   Calcium 7.2 (L) 8.9 - 10.3 mg/dL   GFR calc non Af Amer 20 (L) >60 mL/min   GFR calc Af Amer 23 (L) >60 mL/min    Comment: (NOTE) The eGFR has been calculated using the CKD EPI equation. This calculation has not been validated in all clinical situations. eGFR's persistently <60 mL/min signify possible Chronic Kidney Disease.    Anion gap 7 5 - 15  Basic metabolic panel     Status: Abnormal   Collection Time: 02/18/17  4:25 AM  Result Value Ref Range   Sodium 153 (H) 135 - 145 mmol/L   Potassium 4.1 3.5 - 5.1 mmol/L   Chloride 112 (H) 101 - 111 mmol/L   CO2 36 (H) 22 - 32 mmol/L   Glucose, Bld 187 (H) 65 - 99 mg/dL   BUN 63 (H) 6 - 20 mg/dL   Creatinine, Ser 3.10 (H) 0.61 - 1.24 mg/dL   Calcium 7.2 (L) 8.9 - 10.3 mg/dL   GFR calc non Af Amer 17 (L) >60 mL/min   GFR calc Af Amer 20 (L) >60 mL/min    Comment: (NOTE) The eGFR has been calculated using the CKD EPI equation. This calculation has not been validated in all clinical situations. eGFR's persistently <60 mL/min signify possible Chronic Kidney Disease.    Anion gap 5 5 - 15  CBC     Status: Abnormal   Collection Time: 02/18/17  4:25 AM  Result Value Ref Range    WBC 8.2 4.0 - 10.5 K/uL   RBC 2.56 (L) 4.22 - 5.81 MIL/uL   Hemoglobin 7.1 (L) 13.0 - 17.0 g/dL   HCT 24.4 (L) 39.0 - 52.0 %   MCV 95.3 78.0 - 100.0 fL  MCH 27.7 26.0 - 34.0 pg   MCHC 29.1 (L) 30.0 - 36.0 g/dL   RDW 15.9 (H) 11.5 - 15.5 %   Platelets 218 150 - 400 K/uL    No results found.  Review of Systems  Constitutional: Negative for chills and fever.  Respiratory: Negative for cough and shortness of breath.   Cardiovascular: Positive for leg swelling. Negative for orthopnea.  Gastrointestinal: Negative for abdominal pain, constipation, nausea and vomiting.  Neurological: Positive for weakness.   Blood pressure (!) 97/59, pulse 68, temperature 98.2 F (36.8 C), temperature source Oral, resp. rate (!) 32, height 6' (1.829 m), weight 91.7 kg (202 lb 2.6 oz), SpO2 100 %. Physical Exam  Constitutional: He is oriented to person, place, and time. No distress.  Eyes: No scleral icterus.  Neck: No JVD present.  Cardiovascular: Normal rate, regular rhythm and normal heart sounds.   Respiratory: No respiratory distress. He has wheezes.  GI: He exhibits no distension.  Musculoskeletal: He exhibits edema.  Neurological: He is alert and oriented to person, place, and time.    Assessment/Plan: Problem #1 acute kidney injury superimposed on chronic. Presently patient BUN and creatinine is worsening even though  patient is nonoliguric. In presence of hypotension and hypernatremia the etiology could be secondary to prerenal syndrome. However ATN and other discharges cannot be ruled out. Problem #2 chronic renal failure: Creatinine 1.23 on 10/31/12                                                       Creatinine 1.10 on 12/18/13                                                       Creatinine 5.78 on 3/certain/17                                                       Creatinine 1.54 on 02/18/2017 stage III. Patient with CT scan on 02/06/17 and it didn't show any hydronephrosis. Problem #3  hypernatremia: Most likely from lack of free water Problem #4 history of exploratory laparotomy and small bowel partial obstruction 2. Being followed by surgery Problem #5 anemia Problem #6 history of back surgery Problem #7 history of renal CA status post partial left nephrectomy  Plan: We will check ultrasound of his kidneys 2] we'll check his urine sodium and creatinine 3] we'll start him on D5 water at 75 mL per hour 4] we'll use Lasix 60 g IV twice a day 5] we will start patient on albumin 25 g IV twice a day for about 2 days. 6] we'll check his renal panel in the morning  Franko Hilliker S 02/18/2017, 9:28 AM

## 2017-02-18 NOTE — Progress Notes (Signed)
Pharmacy Antibiotic Note  Joshua Downs is a 83 y.o. male admitted on 01/22/2017 with multiple medical issues.  Pt now with oropharyngeal candidiasis.  Pt has worsening renal fxn.  Pharmacy has been consulted for DIFLUCAN dosing.  Plan: Fluconazole 100mg  IV x 1 dose today then 50mg  PO daily Monitor renal fxn, progress  Height: 6' (182.9 cm) Weight: 202 lb 2.6 oz (91.7 kg) IBW/kg (Calculated) : 77.6  Temp (24hrs), Avg:97.9 F (36.6 C), Min:97.5 F (36.4 C), Max:98.2 F (36.8 C)   Recent Labs Lab 02/11/2017 1427 02/15/17 0412 02/15/17 0847 02/16/17 0402 02/17/17 0010 02/18/17 0425  WBC 9.0 7.3  --  6.5 8.8 8.2  CREATININE 1.68* 1.99* 2.13* 2.39* 2.74* 3.10*    Estimated Creatinine Clearance: 19.5 mL/min (A) (by C-G formula based on SCr of 3.1 mg/dL (H)).    Allergies  Allergen Reactions  . No Known Allergies    Thank you for allowing pharmacy to be a part of this patient's care.  Hart Robinsons A 02/18/2017 11:07 AM

## 2017-02-18 NOTE — Progress Notes (Signed)
4 Days Post-Op  Subjective: Patient states he does have some difficulty swallowing with a burning sensation in his throat.  Objective: Vital signs in last 24 hours: Temp:  [97.5 F (36.4 C)-98.2 F (36.8 C)] 98.2 F (36.8 C) (04/22 0400) Pulse Rate:  [66-90] 68 (04/22 0600) Resp:  [20-36] 32 (04/22 0600) BP: (87-112)/(52-61) 97/59 (04/22 0600) SpO2:  [91 %-100 %] 100 % (04/22 0600) Last BM Date: 02/18/17  Intake/Output from previous day: 04/21 0701 - 04/22 0700 In: 2194.6 [P.O.:30; I.V.:2027.1; IV Piggyback:137.5] Out: 600 [Urine:450; Drains:150] Intake/Output this shift: No intake/output data recorded.  General appearance: alert, cooperative and no distress Throat: Soft white plaques noted on tongue and back of throat. Resp: clear to auscultation bilaterally Cardio: regular rate and rhythm, S1, S2 normal, no murmur, click, rub or gallop GI: Soft, active bowel sounds appreciated. Dressing intact without drainage. JP drainage serosanguineous in nature.  Lab Results:   Recent Labs  02/17/17 0010 02/18/17 0425  WBC 8.8 8.2  HGB 7.7* 7.1*  HCT 25.6* 24.4*  PLT 250 218   BMET  Recent Labs  02/17/17 0010 02/18/17 0425  NA 154* 153*  K 3.6 4.1  CL 111 112*  CO2 36* 36*  GLUCOSE 190* 187*  BUN 58* 63*  CREATININE 2.74* 3.10*  CALCIUM 7.2* 7.2*   PT/INR No results for input(s): LABPROT, INR in the last 72 hours.  Studies/Results: No results found.  Anti-infectives: Anti-infectives    Start     Dose/Rate Route Frequency Ordered Stop   01/28/2017 1800  piperacillin-tazobactam (ZOSYN) IVPB 3.375 g     3.375 g 12.5 mL/hr over 240 Minutes Intravenous Every 8 hours 01/29/2017 1613     02/06/17 1700  piperacillin-tazobactam (ZOSYN) IVPB 3.375 g  Status:  Discontinued     3.375 g 12.5 mL/hr over 240 Minutes Intravenous Every 8 hours 02/06/17 1629 02/18/2017 1613   02/02/17 0900  cefTRIAXone (ROCEPHIN) 2 g in dextrose 5 % 50 mL IVPB     2 g 100 mL/hr over 30 Minutes  Intravenous  Once 01/28/2017 1429 02/02/17 0930   02/09/2017 1730  metroNIDAZOLE (FLAGYL) IVPB 500 mg     500 mg 100 mL/hr over 60 Minutes Intravenous Every 8 hours 02/14/2017 1429 02/02/17 0325   01/28/2017 0822  cefTRIAXone (ROCEPHIN) 2 g in dextrose 5 % 50 mL IVPB     2 g 100 mL/hr over 30 Minutes Intravenous On call to O.R. 02/25/2017 0347 02/17/2017 0920   02/23/2017 4259  metroNIDAZOLE (FLAGYL) IVPB 500 mg  Status:  Discontinued     500 mg 100 mL/hr over 60 Minutes Intravenous Every 8 hours 02/02/2017 0822 02/03/2017 1330      Assessment/Plan: s/p Procedure(s): EXPLORATORY LAPAROTOMY PARTIAL SMALL BOWEL RESECTION X 2 Impression: Postoperative day 4. Renal function continues to worsen. Appreciate nephrology consultation. Hypernatremia still present. Patient has oral thrush. He is tolerating a full liquid diet, thus will advance to soft diet. We'll start Diflucan for oral thrush and presumed candidal esophagitis. He has been on a prolonged course of IV antibiotics. Discussed overall condition with family.  LOS: 25 days    Aviva Signs 02/18/2017

## 2017-02-19 ENCOUNTER — Inpatient Hospital Stay (HOSPITAL_COMMUNITY): Payer: Medicare Other

## 2017-02-19 DIAGNOSIS — L8990 Pressure ulcer of unspecified site, unspecified stage: Secondary | ICD-10-CM | POA: Insufficient documentation

## 2017-02-19 LAB — CBC
HEMATOCRIT: 23.5 % — AB (ref 39.0–52.0)
Hemoglobin: 6.5 g/dL — CL (ref 13.0–17.0)
MCH: 27.3 pg (ref 26.0–34.0)
MCHC: 27.7 g/dL — ABNORMAL LOW (ref 30.0–36.0)
MCV: 98.7 fL (ref 78.0–100.0)
Platelets: 207 10*3/uL (ref 150–400)
RBC: 2.38 MIL/uL — ABNORMAL LOW (ref 4.22–5.81)
RDW: 15.5 % (ref 11.5–15.5)
WBC: 8.5 10*3/uL (ref 4.0–10.5)

## 2017-02-19 LAB — COMPREHENSIVE METABOLIC PANEL
ALT: 15 U/L — ABNORMAL LOW (ref 17–63)
ANION GAP: 8 (ref 5–15)
AST: 13 U/L — AB (ref 15–41)
Albumin: 2.1 g/dL — ABNORMAL LOW (ref 3.5–5.0)
Alkaline Phosphatase: 38 U/L (ref 38–126)
BILIRUBIN TOTAL: 1.2 mg/dL (ref 0.3–1.2)
BUN: 68 mg/dL — ABNORMAL HIGH (ref 6–20)
CO2: 34 mmol/L — ABNORMAL HIGH (ref 22–32)
Calcium: 7.4 mg/dL — ABNORMAL LOW (ref 8.9–10.3)
Chloride: 107 mmol/L (ref 101–111)
Creatinine, Ser: 3.58 mg/dL — ABNORMAL HIGH (ref 0.61–1.24)
GFR, EST AFRICAN AMERICAN: 17 mL/min — AB (ref >60)
GFR, EST NON AFRICAN AMERICAN: 14 mL/min — AB (ref >60)
Glucose, Bld: 181 mg/dL — ABNORMAL HIGH (ref 65–99)
POTASSIUM: 3.9 mmol/L (ref 3.5–5.1)
Sodium: 149 mmol/L — ABNORMAL HIGH (ref 135–145)
TOTAL PROTEIN: 5.4 g/dL — AB (ref 6.5–8.1)

## 2017-02-19 LAB — RENAL FUNCTION PANEL
ALBUMIN: 2.1 g/dL — AB (ref 3.5–5.0)
Anion gap: 8 (ref 5–15)
BUN: 67 mg/dL — AB (ref 6–20)
CALCIUM: 7.4 mg/dL — AB (ref 8.9–10.3)
CO2: 35 mmol/L — AB (ref 22–32)
CREATININE: 3.57 mg/dL — AB (ref 0.61–1.24)
Chloride: 106 mmol/L (ref 101–111)
GFR calc Af Amer: 17 mL/min — ABNORMAL LOW (ref >60)
GFR, EST NON AFRICAN AMERICAN: 14 mL/min — AB (ref >60)
GLUCOSE: 181 mg/dL — AB (ref 65–99)
Phosphorus: 6 mg/dL — ABNORMAL HIGH (ref 2.5–4.6)
Potassium: 3.9 mmol/L (ref 3.5–5.1)
SODIUM: 149 mmol/L — AB (ref 135–145)

## 2017-02-19 LAB — PREPARE RBC (CROSSMATCH)

## 2017-02-19 MED ORDER — PIPERACILLIN-TAZOBACTAM IN DEX 2-0.25 GM/50ML IV SOLN
2.2500 g | Freq: Three times a day (TID) | INTRAVENOUS | Status: DC
  Filled 2017-02-19 (×4): qty 50

## 2017-02-19 MED ORDER — FUROSEMIDE 10 MG/ML IJ SOLN
8.0000 mg/h | INTRAVENOUS | Status: DC
  Administered 2017-02-19: 8 mg/h via INTRAVENOUS
  Filled 2017-02-19: qty 25

## 2017-02-19 MED ORDER — PIPERACILLIN SOD-TAZOBACTAM SO 2.25 (2-0.25) G IV SOLR
2.2500 g | Freq: Three times a day (TID) | INTRAVENOUS | Status: DC
  Filled 2017-02-19 (×4): qty 2.25

## 2017-02-19 MED ORDER — SODIUM CHLORIDE 0.9 % IV SOLN
Freq: Once | INTRAVENOUS | Status: AC
  Administered 2017-02-19: 08:00:00 via INTRAVENOUS

## 2017-02-19 MED ORDER — NOREPINEPHRINE BITARTRATE 1 MG/ML IV SOLN
0.0000 ug/min | INTRAVENOUS | Status: DC
  Administered 2017-02-19: 2 ug/min via INTRAVENOUS
  Filled 2017-02-19: qty 4

## 2017-02-22 LAB — TYPE AND SCREEN
ABO/RH(D): A POS
Antibody Screen: NEGATIVE
UNIT DIVISION: 0
Unit division: 0
Unit division: 0

## 2017-02-22 LAB — BPAM RBC
BLOOD PRODUCT EXPIRATION DATE: 201805112359
Blood Product Expiration Date: 201804302359
Blood Product Expiration Date: 201805112359
ISSUE DATE / TIME: 201804231033
ISSUE DATE / TIME: 201804231413
UNIT TYPE AND RH: 6200
Unit Type and Rh: 600
Unit Type and Rh: 6200

## 2017-02-27 NOTE — Progress Notes (Signed)
Dr Arnoldo Morale at bedside speaking with family.

## 2017-02-27 NOTE — Death Summary Note (Signed)
DEATH SUMMARY   Patient Details  Name: Joshua Downs MRN: [id number] DOB: [birth year]  Admission/Discharge Information   Admit Date:  01/31/17  Date of Death: Date of Death: 2017/02/26  Time of Death: Time of Death: 68  Length of Stay: 30-Jan-2023  Referring Physician: Deloria Lair, MD   Reason(s) for Hospitalization  Small bowel obstruction  Diagnoses  Preliminary cause of death: Multiple organ failure with heart failure ([place]) Secondary Diagnoses (including complications and co-morbidities):  Principal Problem:   Small bowel obstruction ([place]) Active Problems:   Acute renal failure (ARF) ([facility])   Hyperglycemia   Protein-calorie malnutrition, severe   Malnutrition of moderate degree   Small bowel anastomotic leak   Pressure injury of skin Hypernatremia,  Anemia- acute on chronic  Brief Hospital Course (including significant findings, care, treatment, and services provided and events leading to death)  Joshua Downs is a 83 y.o. year old male who was  Admitted to the hospital on 01-31-17  for acute renal failure  And a small bowel obstruction.  He was status post lumbar laminectomy on  01/04/2017. His initial treatment included bowel rest with NG tube decompression.  He was given IV fluids for hydration.  Initially, his bowel obstruction seemed to resolve.  His NG tube was removed and his diet was advanced.  Subsequently, this failed and the NG tube had to be replaced.  It was soft like taken to the operating room on 02/06/2017 by Dr. Tama High for exploratory laparotomy.  At time of surgery, significant adhesions were found and multiple enterotomies had to be repaired. His postoperative course was remarkable for a prolonged ileus with minimal return of bowel function.  He subsequently underwent a CT scan of the abdomen on both 02/06/2017 and 02/07/2017.  He ultimately was found to have a bowel leak with pneumoperitoneum present.  He underwent CT-guided percutaneous drainage of the  intra-abdominal fluid collection 2.  Succus entericus was found.  It was continued on TPN and bowel rest, in hopes that this would result in the closure of the bowel leak.  During this time, his renal insufficiency persisted and he did develop hyponatremia.  This was felt to be secondary to his TPN.  Ultimately, patient required reexploration by Dr. Aviva Signs on 02/18/2017.  The affected area of small bowel was resected and primary anastomosis was able to be performed.  Additionally, a smaller area in the jejunum was also resected.  He tolerated the procedure well.  His postoperative course was remarkable for worsening of his renal insufficiency.  He also had persistent hypernatremia.  His bowel function did return and his diet was advanced.  Nephrology was consulted.  The patient did receive multiple units of packed red blood cells throughout his stay.  On the evening of 02/18/2017, the patient's respiratory status began to deteriorate.  In addition, his mental status deteriorated.  Family at that time elected to make him DNR.  On 2017-02-26, he started becoming more hypotensive with anemia, respiratory failure, and worsening urinary output.  He was started on the Levophed.  A chest x-ray showed pulmonary congestion.  The family again reiterated that they wished to be DNR, as per the patient's wishes.  The patient expired with family at the bedside at 2:55 PM.    Pertinent Labs and Studies  Significant Diagnostic Studies Ct Abdomen Pelvis Wo Contrast  Result Date: 02/06/2017 CLINICAL DATA:  Follow-up surgical pneumoperitoneum EXAM: CT ABDOMEN AND PELVIS WITHOUT CONTRAST TECHNIQUE: Multidetector CT imaging of the abdomen  and pelvis was performed following the standard protocol without IV contrast. COMPARISON:  02/05/2017 FINDINGS: Lower chest: Lung bases are clear. Small right pleural effusion, mildly increased. Associated right lower lobe opacity, likely compressive atelectasis. Hepatobiliary: Unenhanced  liver is grossly unremarkable. Cholelithiasis, without associated inflammatory changes. Pancreas: Within normal limits. Spleen: Within normal limits. Adrenals/Urinary Tract: Adrenal glands are within normal limits. Small right renal cysts measuring up to 2.0 cm. Left kidney is unremarkable. No renal, ureteral, or bladder calculi. No hydronephrosis. Bladder is grossly unremarkable. Stomach/Bowel: Stomach is grossly unremarkable. No evidence of bowel obstruction. Suture line from small bowel resection in the left lower abdomen (series 2/image 56). Sigmoid diverticulosis, without evidence of diverticulitis. Fluid/gas collection arising from the right lower abdomen near the cecum (series 2/ image 48), measuring approximately 9.1 x 27.4 cm in greatest axial dimension of the level of the mid abdomen (series 2/ image 39), and extending superiorly with mottled gas/debris along the right hepatic dome (series 2/image 7). This has mildly increased from the recent prior and is worrisome for perforation with extravasation of hyperdense fluid/ contrast (series 2/ image 42). Vascular/Lymphatic: No evidence of abdominal aortic aneurysm. Atherosclerotic calcifications of the abdominal aorta and branch vessels. No suspicious abdominopelvic lymphadenopathy. Reproductive: Prostatomegaly. Other: Additional small to moderate mesenteric/ pelvic ascites. Musculoskeletal: Degenerative changes of the visualized thoracolumbar spine. Status post L2-5 PLIF. IMPRESSION: Fluid/gas collection arising from the right lower abdomen near the cecum, extending into the anterior mid abdomen, and superiorly along the right hepatic dome. Suspected extravasation of contrast into the collection, suggesting bowel perforation. This has progressed from the recent prior. Postsurgical changes related to prior bowel resection in the left lower abdomen. No evidence of bowel obstruction. Additional small to moderate mesenteric/ pelvic ascites. Small right pleural  effusion. Associated right lower lobe opacity, likely atelectasis. Additional ancillary findings as above. These results were called by telephone at the time of interpretation on 02/06/2017 at 4:10 pm to Dr. Aviva Signs , who verbally acknowledged these results. Electronically Signed   By: Julian Hy M.D.   On: 02/06/2017 16:13   Ct Abdomen Pelvis Wo Contrast  Result Date: 02/05/2017 CLINICAL DATA:  Small bowel obstruction. Status post laparotomy and lysis of adhesions on 02/07/2017. EXAM: CT ABDOMEN AND PELVIS WITHOUT CONTRAST TECHNIQUE: Multidetector CT imaging of the abdomen and pelvis was performed following the standard protocol without IV contrast. COMPARISON:  01/31/2017 FINDINGS: Lower chest: New small right pleural effusion with overlying dependent atelectasis in the lower lobe. Milder dependent atelectasis in the left lower lobe. Coronary artery atherosclerosis. Venous catheter tip in the lower SVC. Hepatobiliary: No focal liver abnormality is seen on this unenhanced study. Unchanged gallstones. No biliary dilatation. Pancreas: Unremarkable. Spleen: Unremarkable. Adrenals/Urinary Tract: Unremarkable adrenal glands. No hydronephrosis. Unchanged right renal cysts measuring up to 2.1 cm in size. Unremarkable bladder. Stomach/Bowel: Enteric tube terminates in the gastric body. Small bowel dilatation has greatly decreased from the prior CT. Oral contrast is present in multiple loops of proximal and mid small bowel which remain mildly dilated up to approximately 4 cm in diameter. There is a transition to decompressed distal small bowel in the left mid to lower abdomen at the site of new suture material (series 2, image 68). A small amount of gas is present in the transverse colon. The colon is otherwise decompressed. There is sigmoid colon diverticulosis without definite evidence of acute diverticulitis. No extraluminal contrast material is identified. Vascular/Lymphatic: Abdominal aortic  atherosclerosis without aneurysm. No enlarged lymph nodes. Reproductive: Mild prostatic  enlargement. Other: Postsurgical changes from interval exploratory laparotomy with skin staples in the anterior abdominal wall. Large volume intraperitoneal free air with moderate volume intraperitoneal free fluid. Two large gas and fluid collections in the upper abdomen anterior to the colon measuring 12-15 cm each. Diffuse subcutaneous fat stranding, mildly asymmetric in the right flank region. Musculoskeletal: Left iliopsoas bursal fluid and calcification as previously seen. Prior L2-L5 fusion. Advanced disc degeneration at L1-2. IMPRESSION: 1. Postoperative changes from interval laparotomy. Large amount of intraperitoneal free air and moderate volume free fluid. Large gas and fluid collections in the upper abdomen. 2. Mild small bowel dilatation, improved from the prior study though with transition point in the left mid to lower abdomen concerning for residual/recurrent obstruction. 3. New small right pleural effusion.  Bibasilar atelectasis. 4. Aortic atherosclerosis. Electronically Signed   By: Logan Bores M.D.   On: 02/05/2017 15:43   Ct Abdomen Pelvis Wo Contrast  Result Date: 01/11/2017 CLINICAL DATA:  83 year old male with coffee ground emesis and abdominal pain. Nausea vomiting for 3 days. EXAM: CT ABDOMEN AND PELVIS WITHOUT CONTRAST TECHNIQUE: Multidetector CT imaging of the abdomen and pelvis was performed following the standard protocol without IV contrast. COMPARISON:  Abdomen MRI 12/21/2016. CT Abdomen and Pelvis 04/17/2005 FINDINGS: Lower chest: Negative.  No pericardial or pleural effusion. There is mild oral contrast distention of the visible distal esophagus. See gastrointestinal findings below. Hepatobiliary: Chronic cholelithiasis. Calcified up to 18 mm gallstones. The gallbladder is contracted. Negative noncontrast liver. Pancreas: Negative. Spleen: Negative. Adrenals/Urinary Tract: Negative adrenal  glands. Chronic right renal midpole benign-appearing cysts have mildly enlarged since 2006. No hydronephrosis. Unremarkable urinary bladder. No hydroureter. Stomach/Bowel: Moderate to severe gaseous and contrast distension of the stomach. The duodenum is dilated. Proximal small bowel loops are moderately to severely dilated up to 5 cm diameter, and there is a high-grade abrupt transition point in the mid small bowel as seen on series 2, image 58 and coronal image 35. An obstructing etiology is unclear, but perhaps related to internal hernia. See sagittal images. Despite this severe dilatation of bowel there is no free air or free fluid. Distal to the transition point the small bowel loops are all decompressed to the terminal ileum. Negative appendix. Decompressed large bowel. Sigmoid diverticulosis. Vascular/Lymphatic: Vascular patency is not evaluated in the absence of IV contrast. Aortoiliac calcified atherosclerosis. No lymphadenopathy. Reproductive: Negative. Other: No pelvic free fluid. Musculoskeletal: Postoperative changes to the lumbar spine. Osteopenia. Degenerative changes about the left hip including probable multiple intramuscular synovial cysts, affecting the distal iliopsoas muscle. No acute osseous abnormality identified. IMPRESSION: 1. Severe, high-grade small bowel obstruction. Abrupt transition point in the mid abdomen as seen on coronal image 35. Obstructing etiology uncertain but perhaps related to internal hernia. 2. No free air or free fluid. 3.  Calcified aortic atherosclerosis. 4. Chronic cholelithiasis. Electronically Signed   By: Genevie Ann M.D.   On: 01/07/2017 15:11   Dg Chest 1 View  Result Date: 02/06/2017 CLINICAL DATA:  History of renal malignancy, recent lysis of adhesions EXAM: CHEST 1 VIEW COMPARISON:  Portable chest x-ray of February 05, 2017 FINDINGS: There remains gas-filled bowel under the right hemidiaphragm. There are other loops of gas-filled bowel noted under the left  hemidiaphragm. The cardiac silhouette is mildly enlarged. The pulmonary vascularity is prominent centrally. The interstitial markings are mildly prominent though stable. The elevated right hemidiaphragm decreases the excursion of the right lung. There is calcification in the wall of the aortic arch. The nasogastric tube  tip lies below the inferior margin of the image. There is a portion of the proximal port seen in the region of the pylorus. IMPRESSION: Hypoinflation of the right lung due to elevation of the hemidiaphragm secondary to presumed gas-filled bowel under this diaphragm. Free intra abdominal air from the laparotomy 5 days ago would likely be reabsorbed by now. Stable cardiomegaly. Thoracic aortic atherosclerosis. An acute abdominal series is recommended to further evaluate the subdiaphragmatic structures and assure that no significant free extraluminal gas is in the present. This would also allow assessment of the positioning of the tip of the nasogastric tube. Electronically Signed   By: David  Martinique M.D.   On: 02/06/2017 07:31   Dg Abd 1 View  Result Date: 02/06/2017 CLINICAL DATA:  NG tube placement. EXAM: ABDOMEN - 1 VIEW COMPARISON:  CT abdomen and pelvis this same day. Single-view of the abdomen earlier today. FINDINGS: NG tube is in place with the tip in the mid to distal body of the stomach. Gaseous distention of bowel is noted. Midline surgical staples are noted. IMPRESSION: NG tube in good position. Electronically Signed   By: Inge Rise M.D.   On: 02/06/2017 20:09   Dg Abd 1 View  Result Date: 02/06/2017 CLINICAL DATA:  NG tube placement. EXAM: ABDOMEN - 1 VIEW COMPARISON:  02/04/2017 FINDINGS: NGT tip is in the proximal duodenum. Air in multiple large and small bowel loops with diminished distention of small bowel since the prior study. Contrast remains in the ascending colon. Multiple gallstones. IMPRESSION: NG tube tip in the duodenum.  Improved distention of small bowel.  Electronically Signed   By: Lorriane Shire M.D.   On: 02/06/2017 10:06   Dg Abd 1 View  Result Date: 02/04/2017 CLINICAL DATA:  Abdominal pain, vomiting, and distention. Small bowel obstruction. EXAM: ABDOMEN - 1 VIEW COMPARISON:  01/31/2017 FINDINGS: Nasogastric tube is again seen within the proximal stomach. Multiple dilated small bowel loops show improvement since previous study, however there remains a paucity of colonic gas. Skin staples are seen in the lower abdomen. Multiple calcified gallstones again seen in the right upper quadrant. Lumbar spine fusion hardware again seen. IMPRESSION: Dilated small bowel loops show mild improvement, but remains suspicious for small bowel obstruction. Nasogastric tube in proximal stomach. Cholelithiasis. Electronically Signed   By: Earle Gell M.D.   On: 02/04/2017 19:56   Ct Abdomen Pelvis W Contrast  Result Date: 01/31/2017 CLINICAL DATA:  Left renal carcinoma 2004 with partial nephrectomy. Severe small-bowel obstruction, nausea and vomiting. EXAM: CT ABDOMEN AND PELVIS WITH CONTRAST TECHNIQUE: Multidetector CT imaging of the abdomen and pelvis was performed using the standard protocol following bolus administration of intravenous contrast. CONTRAST:  152mL ISOVUE-300 IOPAMIDOL (ISOVUE-300) INJECTION 61% COMPARISON:  01/12/2017 FINDINGS: Lower chest: Top normal size cardiac chambers. No pericardial effusion. Left basilar atelectasis with minimal bronchiectasis. Hepatobiliary: Homogeneous attenuation of the liver without space-occupying mass. Nondistended gallbladder with gallstones. Pancreas: Unremarkable. No pancreatic ductal dilatation or surrounding inflammatory changes. Spleen: Normal in size without focal abnormality. Adrenals/Urinary Tract: Stable right-sided renal cysts. Normal appearing adrenal glands. No obstructive uropathy. Unremarkable urinary bladder. No solid enhancing renal mass in either kidney. Stomach/Bowel: Gastric tube is seen within fluid-filled  distended stomach, coiled back upon itself within the gastric antrum. Markedly dilated air and fluid-filled small bowel loops measuring up to 5.6 cm are again identified a transition point in the mid pelvis similar to that seen previously possibly related to an adhesion. Mid to distal ileal loops superior contracted.  Large bowel is nondistended. There is no free air. Vascular/Lymphatic: Aortoiliac atherosclerosis. Opacification of the celiac axis, SMA and IMA. Reproductive: Mildly enlarged prostate up to 5.8 cm in maximum dimension. Other: No free fluid. Musculoskeletal: Left iliopsoas bursal fluid containing ill-defined a amorphous calcifications possibly representing changes of osteochondral bodies from the left hip. Bilateral axial joint space narrowing of the hips. Postop change of the lumbar spine. IMPRESSION: 1. Relatively unchanged appearance of high-grade small bowel obstruction to the level of the mid ileum. Transition zone is less well-defined on current exam but noted on prior study to be within the mid upper pelvis. Gastric tube is seen within distended stomach slightly coiled back upon itself within the antrum. 2. Stable right-sided renal cysts. 3. Left iliopsoas bursal fluid with calcifications possibly representing osteochondral bodies. 4. Aortoiliac atherosclerosis. Electronically Signed   By: Ashley Royalty M.D.   On: 01/31/2017 22:22   US Renal  Result Date: 02/18/2017 CLINICAL DATA:  Renal failure. History of a left partial nephrectomy. EXAM: RENAL / URINARY TRACT ULTRASOUND COMPLETE COMPARISON:  CT, 02/06/2017 FINDINGS: Right Kidney: Length: 9.2 cm. 17 mm upper pole cyst. Normal parenchymal echogenicity. No other renal masses, no stones and no hydronephrosis. Left Kidney: Length: 10.1 cm. Echogenicity within normal limits. No mass or hydronephrosis visualized. Bladder: Limited visualization.  No convincing mass or wall thickening. IMPRESSION: 1. No acute findings.  No hydronephrosis. 2. 17 mm  right renal cyst stable from the prior CT. Electronically Signed   By: Lajean Manes M.D.   On: 02/18/2017 12:59   US Venous Img Upper Uni Left  Result Date: 02/06/2017 CLINICAL DATA:  83 y/o  M; left upper extremity swelling for 1 day. EXAM: LEFT UPPER EXTREMITY VENOUS DOPPLER ULTRASOUND TECHNIQUE: Gray-scale sonography with graded compression, as well as color Doppler and duplex ultrasound were performed to evaluate the upper extremity deep venous system from the level of the subclavian vein and including the jugular, axillary, basilic, radial, ulnar and upper cephalic vein. Spectral Doppler was utilized to evaluate flow at rest and with distal augmentation maneuvers. COMPARISON:  None. FINDINGS: Contralateral Subclavian Vein: Respiratory phasicity is normal and symmetric with the symptomatic side. No evidence of thrombus. Normal compressibility. Internal Jugular Vein: No evidence of thrombus. Normal compressibility, respiratory phasicity and response to augmentation. Subclavian Vein: No evidence of thrombus. Normal compressibility, respiratory phasicity and response to augmentation. PICC line. Axillary Vein: Small noncompressible when nonocclusive thrombus present. PICC line. Cephalic Vein: No evidence of thrombus. Normal compressibility, respiratory phasicity and response to augmentation. Basilic Vein: No evidence of thrombus. Normal compressibility, respiratory phasicity and response to augmentation. PICC line. Brachial Veins: No evidence of thrombus. Normal compressibility, respiratory phasicity and response to augmentation. Partially obscured by bandages. Radial Veins: No evidence of thrombus. Normal compressibility, respiratory phasicity and response to augmentation. Ulnar Veins: No evidence of thrombus. Normal compressibility, respiratory phasicity and response to augmentation. Partially obscured by bandages. Venous Reflux:  None visualized. Other Findings:  None visualized. IMPRESSION: Small  nonocclusive acute thrombus within the left axillary vein. These results will be called to the ordering clinician or representative by the Radiologist Assistant, and communication documented in the PACS or zVision Dashboard. Electronically Signed   By: Kristine Garbe M.D.   On: 02/16/2017 15:37   Dg Chest Port 1 View  Result Date: 03/18/2017 CLINICAL DATA:  Shortness of breath. EXAM: PORTABLE CHEST 1 VIEW COMPARISON:  02/15/2017 FINDINGS: Left PICC, left jugular catheter, and enteric tube have been removed. Right jugular catheter terminates over  the lower SVC. Aortic atherosclerosis is noted. The cardiac silhouette remains enlarged. Lung volumes are diminished, more so than on the prior study. There is pulmonary vascular congestion with bilateral pleural effusions and bibasilar lung opacities likely reflecting atelectasis. Gas beneath the right hemidiaphragm is less conspicuous than on the prior study. No pneumothorax is seen. IMPRESSION: Diminished lung volumes with pulmonary vascular congestion, pleural effusions, and increased bibasilar atelectasis. Electronically Signed   By: Logan Bores M.D.   On: Mar 17, 2017 07:58   Dg Chest Port 1 View  Result Date: 02/15/2017 CLINICAL DATA:  Left IJ line placement EXAM: PORTABLE CHEST 1 VIEW COMPARISON:  01/29/2017 FINDINGS: Low lung volumes with mild right basilar atelectasis. No pneumothorax. The heart is normal in size.  Thoracic aortic atherosclerosis. Left arm PICC terminates in the upper right atrium. Left IJ catheter extends more inferiorly than is typically seen and overlies the aortic arch. Enteric tube courses into the proximal gastric body. IMPRESSION: Left IJ catheter extends more inferiorly than is typically seen and overlies the aortic arch. While this may terminate in the mid left brachiocephalic vein, arterial placement should be excluded clinically. Additional support apparatus as above. These results will be called to the ordering  clinician or representative by the Radiologist Assistant, and communication documented in the PACS or zVision Dashboard. Electronically Signed   By: Julian Hy M.D.   On: 02/15/2017 11:56   Dg Chest Port 1 View  Result Date: 02/20/2017 CLINICAL DATA:  83 year old male status post exploratory laparotomy, lysis of adhesions and small bowel repair EXAM: PORTABLE CHEST 1 VIEW COMPARISON:  02/06/2017 and prior exams FINDINGS: Elevation of the right hemidiaphragm and right lower lung atelectasis noted. There is no evidence of pneumothorax. An NG tube entering the stomach with tip off the field of view and left-sided fifth line with tip overlying the lower SVC noted. There is no evidence of pneumothorax. IMPRESSION: Unchanged appearance the chest with continued right lower lung atelectasis. Support apparatus as described. Electronically Signed   By: Margarette Canada M.D.   On: 02/11/2017 14:29   Dg Chest Port 1 View  Result Date: 02/05/2017 CLINICAL DATA:  Assess positioning of the nasogastric tube EXAM: PORTABLE CHEST 1 VIEW COMPARISON:  Portable chest x-ray of January 27, 2017 FINDINGS: The nasogastric tubes proximal port projects above the GE junction. The tip projects below the inferior margin of the image. There is gas under the left hemidiaphragm which may lie within bowel but free air is not excluded. There is some gas in the upper quadrant on the left. The cardiac silhouette is enlarged. The pulmonary vascularity is engorged. IMPRESSION: The esophagogastric tube's proximal port projects above the GE junction. Advancement by 10 cm is needed. Possible free extraluminal gas within the abdomen though the findings could lie within bowel. An acute abdominal series is recommended. These results will be called to the ordering clinician or representative by the Radiologist Assistant, and communication documented in the PACS or zVision Dashboard. Electronically Signed   By: David  Martinique M.D.   On: 02/05/2017 11:15    Dg Chest Port 1 View  Result Date: 01/27/2017 CLINICAL DATA:  Nasogastric tube placement.  Initial encounter. EXAM: PORTABLE CHEST 1 VIEW COMPARISON:  Chest radiograph performed 01/09/2017 FINDINGS: The patient's enteric tube is seen extending below the diaphragm. The lungs are well-aerated and clear. There is no evidence of focal opacification, pleural effusion or pneumothorax. The cardiomediastinal silhouette is within normal limits. No acute osseous abnormalities are seen. IMPRESSION: Enteric tube noted  extending below the diaphragm. Electronically Signed   By: Garald Balding M.D.   On: 01/27/2017 19:00   Dg Chest Port 1 View  Result Date: 01/26/2017 CLINICAL DATA:  Encounter for nasogastric tube placement. EXAM: PORTABLE CHEST 1 VIEW COMPARISON:  Radiograph of same day. FINDINGS: Stable cardiomediastinal silhouette. No pneumothorax or pleural effusion is noted. The lungs are clear. Bony thorax is unremarkable. Distal tip of nasogastric tube is coiled within the proximal stomach. IMPRESSION: Distal tip of nasogastric tube is seen coiled within proximal stomach. Electronically Signed   By: Marijo Conception, M.D.   On: 01/01/2017 16:54   Dg Chest Portable 1 View  Result Date: 01/10/2017 CLINICAL DATA:  NG tube placement. EXAM: PORTABLE CHEST 1 VIEW COMPARISON:  10/18/2013. FINDINGS: NG tube noted. Its tip appears coiled in the distal esophagus. Cardiomegaly with normal pulmonary vascularity. Low lung volumes. No pleural effusion or pneumothorax. Mild gastric distention . IMPRESSION: 1. NG tube noted, its tip appears coiled in the distal esophagus. Repositioning should be considered. Mild gastric distention. 2.  Low lung volumes with mild basilar atelectasis. Critical Value/emergent results were called by telephone at the time of interpretation on 12/29/2016 at 4:07 pm to Dr. Alvino Chapel, who verbally acknowledged these results. Electronically Signed   By: Marcello Moores  Register   On: 01/08/2017 16:10   Dg  Chest Port 1v Same Day  Result Date: 02/15/2017 CLINICAL DATA:  Central line placement.  Verify NGT positioning EXAM: PORTABLE CHEST 1 VIEW COMPARISON:  Earlier today FINDINGS: Right-sided central line has been placed, tip at the upper cavoatrial junction. Unchanged positioning of left upper extremity PICC with tip at the cavoatrial junction or upper right atrium. Left neck catheter as previously described, distally catheter not closely completely following the PICC. Nasogastric tube in good position, with side port seen at the stomach. Low volume chest with hazy opacities. Right more than left diaphragm is elevated. Gas beneath the diaphragm, where there is also surgical drain. Cardiomegaly. IMPRESSION: 1. New central line with tip at the upper cavoatrial junction. No pneumothorax. 2. Elevated right diaphragm with atelectasis and pleural effusion based on most recent CT. 3. Nasogastric tube in stable good position. 4. Unchanged positioning of left-sided vascular catheters. 5. Probable pneumoperitoneum, stable. Electronically Signed   By: Monte Fantasia M.D.   On: 02/15/2017 14:40   Dg Chest Port 1v Same Day  Result Date: 02/15/2017 CLINICAL DATA:  Post PICC line placement EXAM: PORTABLE CHEST 1 VIEW COMPARISON:  02/15/2017 FINDINGS: Cardiomediastinal silhouette is stable. Persistent right base medially atelectasis or infiltrate. Stable NG tube position. Stable left arm PICC line position with tip in right atrium. There is no pneumothorax. Again noted unusual position of left IJ catheter tip with tip overlying the aortic arch. Arterial position or hemi azygous position cannot be excluded. Clinical correlation is necessary. IMPRESSION: Persistent right base medially atelectasis or infiltrate. Stable NG tube position. Stable left arm PICC line position with tip in right atrium. There is no pneumothorax. Again noted unusual position of left IJ catheter tip with tip overlying the aortic arch. Arterial position or  hemi azygous position cannot be excluded. Clinical correlation is necessary. Electronically Signed   By: Lahoma Crocker M.D.   On: 02/15/2017 13:00   Dg Abd Portable 1v  Result Date: 01/31/2017 CLINICAL DATA:  NG tube placement EXAM: PORTABLE ABDOMEN - 1 VIEW COMPARISON:  None. FINDINGS: There is significant gaseous distension of small bowel with progression from prior exam. In mid abdomen small bowel measures at  least 7.3 cm in diameter with progression from prior exam. NG tube is noted coiled within proximal stomach with tip in proximal stomach lateral. Stable postsurgical changes lumbar spine. IMPRESSION: Significant diffuse gaseous distension of the small bowel up to 7.3 cm highly suspicious for small bowel obstruction. There is progression from prior exam. NG tube coiled within proximal stomach with tip in proximal stomach laterally. Electronically Signed   By: Lahoma Crocker M.D.   On: 01/31/2017 11:22   Dg Abd Portable 1v  Result Date: 01/26/2017 CLINICAL DATA:  Small bowel obstruction, nasogastric tube placement EXAM: PORTABLE ABDOMEN - 1 VIEW COMPARISON:  Portable exam 1706 hours compared to 01/26/2017 at 0819 hours FINDINGS: Tube nasogastric tube projects over proximal stomach with proximal side-port potentially just above the gastroesophageal junction; recommend advancing tube 4 cm. Marked gas distention of small bowel loops up to 7.1 cm diameter. No definite bowel wall thickening. No significant colonic gas. Prior lumbar fusion. Diffuse osseous demineralization. IMPRESSION: Persistent small bowel dilatation/obstruction. Recommend advancing nasogastric tube 4 cm. Findings called to [facility] on [address] on 01/26/2017 at 1738 hours. Electronically Signed   By: Lavonia Dana M.D.   On: 01/26/2017 17:39   Dg Abd Portable 2v  Result Date: 01/27/2017 CLINICAL DATA:  Followup small bowel obstruction. EXAM: PORTABLE ABDOMEN - 2 VIEW COMPARISON:  Yesterday. FINDINGS: Multiple dilated small bowel loops with a  mild decrease in caliber. The maximum small bowel diameter is currently 6.4 cm. No bowel wall thickening or pneumatosis seen. No free peritoneal air. Nasogastric tube tip and side hole in the proximal stomach. Post laminectomy changes and fixation hardware in the lumbar spine. Thoracolumbar spine degenerative changes and scoliosis. IMPRESSION: Mild improved small-bowel obstruction. Electronically Signed   By: Claudie Revering M.D.   On: 01/27/2017 07:56   Dg Abd Portable 2v  Result Date: 01/26/2017 CLINICAL DATA:  Abdominal pain. EXAM: PORTABLE ABDOMEN - 2 VIEW COMPARISON:  01/25/2017. FINDINGS: NG tube noted coiled in stomach. Persistent severely distended loops of small bowel are noted consistent small bowel obstruction. Findings consistent with persistent small-bowel obstruction. Some degree of air is noted colon. No free air is noted. Calcifications in the right upper quadrant consistent gallstones. Pelvic calcifications consistent phleboliths. Prior lumbar spine fusion. No free air. No free air. Lumbar spine fusion. IMPRESSION: 1. NG tube noted coiled stomach. Persistent severely distended loops of small bowel are again noted consistent small bowel obstruction. Some degree of air is noted in the colon No free air noted . 2.  Gallstones. Electronically Signed   By: Marcello Moores  Register   On: 01/26/2017 08:55   Dg Abd Portable 2v  Result Date: 01/25/2017 CLINICAL DATA:  Small bowel obstruction EXAM: PORTABLE ABDOMEN - 2 VIEW COMPARISON:  CT abdomen and pelvis January 24, 2017 FINDINGS: Supine and upright images obtained. Nasogastric tube tip and side port in stomach. There remains small bowel dilatation with air-fluid levels in a pattern suggesting persistent bowel obstruction. No evident free air. There is postoperative change in the lumbar spine. There are phleboliths in the pelvis. Is normal. There is no evidence of free air. No radio-opaque calculi or other significant radiographic abnormality is seen.  IMPRESSION: Persistence of small bowel obstruction. No free air evident. Nasogastric tube tip and side port in stomach. Electronically Signed   By: Lowella Grip III M.D.   On: 01/25/2017 11:01   Ct Image Guided Drainage By Percutaneous Catheter  Result Date: 02/08/2017 CLINICAL DATA:  Recent small bowel obstruction status post lysis  of adhesions. Postop CTs demonstrates Fluid/gas collection arising from the right lower abdomen near the cecum, extending into the anterior mid abdomen, and superiorly along the right hepatic dome. Suspected extravasation of contrast into the collection, suggesting bowel perforation. This has progressed from the recent prior. Additional small to moderate cul-de-sac collection. EXAM: 1. CT GUIDED DRAINAGE OF PELVIC ABSCESS 2. CT GUIDED DRAINAGE OF RIGHT UPPER QUADRANT PERITONEAL ABSCESS ANESTHESIA/SEDATION: Intravenous Fentanyl and Versed were administered as conscious sedation during continuous monitoring of the patient's level of consciousness and physiological / cardiorespiratory status by the radiology RN, with a total moderate sedation time of 20 minutes. PROCEDURE: The procedure, risks, benefits, and alternatives were explained to the patient. Questions regarding the procedure were encouraged and answered. The patient understands and consents to the procedure. Patient placed in left anterior oblique position on the CT gantry. Axial scans through abdomen pelvis obtained in the cul-de-sac and right upper quadrant perihepatic collections were localized. Appropriate skin entry sites were marked. The pelvic field was prepped with chlorhexidinein a sterile fashion, and a sterile drape was applied covering the operative field. A sterile gown and sterile gloves were used for the procedure. Local anesthesia was provided with 1% Lidocaine. Under CT fluoroscopic guidance, an 18 gauge trocar needle was advanced into the cul-de-sac loculated collection. Greenish fluid could be  aspirated. An Amplatz guidewire advanced easily within the collection, its position confirmed on CT. Tract dilated to facilitate placement of a 12 French pigtail drain catheter, placed centrally within the collection. 60 mL of the greenish thin aspirate were sent for Gram stain, culture and sensitivity. Catheter was secured externally with 0 Prolene suture and StatLock and placed to external gravity drain bag. In similar fashion, using all new sterile equipment, the right upper abdominal field was prepped with chlorhexidinein a sterile fashion, and a sterile drape was applied covering the operative field. A sterile gown and sterile gloves were used for the procedure. Local anesthesia was provided with 1% Lidocaine. Under CT fluoroscopic guidance, an 18 gauge percutaneous entry needle was advanced into the loculated perihepatic collection. Greenish fluid could be aspirated. An Amplatz guidewire advanced easily within the collection over the dome of the liver, its position confirmed on CT. Tract dilated to facilitate placement of a 12 French pigtail drain catheter, placed centrally within the collection. 30 mL of the greenish thin aspirate were sent for Gram stain, culture and sensitivity. Catheter was secured externally with 0 Prolene suture and StatLock and placed to external gravity drain bag. The patient tolerated the procedure well. COMPLICATIONS: None immediate FINDINGS: Twelve French drain catheter placed in the cul-de-sac collection returning thin greenish fluid, a sample sent for Gram stain and culture. Twelve French drain catheter placed into the right perihepatic gas and fluid collection, returning thin greenish fluid, a sample sent for Gram stain and culture. IMPRESSION: 1. Technically successful CT-guided pelvic abscess drain catheter placement. 2. Technically successful CT-guided perihepatic abscess drain catheter placement. Electronically Signed   By: Lucrezia Europe M.D.   On: 02/08/2017 16:28   Ct Image  Guided Drainage By Percutaneous Catheter  Result Date: 02/08/2017 CLINICAL DATA:  Recent small bowel obstruction status post lysis of adhesions. Postop CTs demonstrates Fluid/gas collection arising from the right lower abdomen near the cecum, extending into the anterior mid abdomen, and superiorly along the right hepatic dome. Suspected extravasation of contrast into the collection, suggesting bowel perforation. This has progressed from the recent prior. Additional small to moderate cul-de-sac collection. EXAM: 1. CT GUIDED DRAINAGE OF  PELVIC ABSCESS 2. CT GUIDED DRAINAGE OF RIGHT UPPER QUADRANT PERITONEAL ABSCESS ANESTHESIA/SEDATION: Intravenous Fentanyl and Versed were administered as conscious sedation during continuous monitoring of the patient's level of consciousness and physiological / cardiorespiratory status by the radiology RN, with a total moderate sedation time of 20 minutes. PROCEDURE: The procedure, risks, benefits, and alternatives were explained to the patient. Questions regarding the procedure were encouraged and answered. The patient understands and consents to the procedure. Patient placed in left anterior oblique position on the CT gantry. Axial scans through abdomen pelvis obtained in the cul-de-sac and right upper quadrant perihepatic collections were localized. Appropriate skin entry sites were marked. The pelvic field was prepped with chlorhexidinein a sterile fashion, and a sterile drape was applied covering the operative field. A sterile gown and sterile gloves were used for the procedure. Local anesthesia was provided with 1% Lidocaine. Under CT fluoroscopic guidance, an 18 gauge trocar needle was advanced into the cul-de-sac loculated collection. Greenish fluid could be aspirated. An Amplatz guidewire advanced easily within the collection, its position confirmed on CT. Tract dilated to facilitate placement of a 12 French pigtail drain catheter, placed centrally within the collection. 60  mL of the greenish thin aspirate were sent for Gram stain, culture and sensitivity. Catheter was secured externally with 0 Prolene suture and StatLock and placed to external gravity drain bag. In similar fashion, using all new sterile equipment, the right upper abdominal field was prepped with chlorhexidinein a sterile fashion, and a sterile drape was applied covering the operative field. A sterile gown and sterile gloves were used for the procedure. Local anesthesia was provided with 1% Lidocaine. Under CT fluoroscopic guidance, an 18 gauge percutaneous entry needle was advanced into the loculated perihepatic collection. Greenish fluid could be aspirated. An Amplatz guidewire advanced easily within the collection over the dome of the liver, its position confirmed on CT. Tract dilated to facilitate placement of a 12 French pigtail drain catheter, placed centrally within the collection. 30 mL of the greenish thin aspirate were sent for Gram stain, culture and sensitivity. Catheter was secured externally with 0 Prolene suture and StatLock and placed to external gravity drain bag. The patient tolerated the procedure well. COMPLICATIONS: None immediate FINDINGS: Twelve French drain catheter placed in the cul-de-sac collection returning thin greenish fluid, a sample sent for Gram stain and culture. Twelve French drain catheter placed into the right perihepatic gas and fluid collection, returning thin greenish fluid, a sample sent for Gram stain and culture. IMPRESSION: 1. Technically successful CT-guided pelvic abscess drain catheter placement. 2. Technically successful CT-guided perihepatic abscess drain catheter placement. Electronically Signed   By: Lucrezia Europe M.D.   On: 02/08/2017 16:28    Microbiology Recent Results (from the past 240 hour(s))  MRSA PCR Screening     Status: None   Collection Time: 02/22/2017  4:26 PM  Result Value Ref Range Status   MRSA by PCR NEGATIVE NEGATIVE Final    Comment:        The  GeneXpert MRSA Assay (FDA approved for NASAL specimens only), is one component of a comprehensive MRSA colonization surveillance program. It is not intended to diagnose MRSA infection nor to guide or monitor treatment for MRSA infections.     Lab Basic Metabolic Panel:  Recent Labs Lab 02/15/17 0412 02/15/17 0847 02/16/17 0402 02/17/17 0010 02/18/17 0425 2017-03-15 0425  NA 156* 154* 156* 154* 153* 149*  149*  K 3.5 3.4* 3.3* 3.6 4.1 3.9  3.9  CL 111 110  110 111 112* 106  107  CO2 37* 37* 37* 36* 36* 35*  34*  GLUCOSE 112* 111* 105* 190* 187* 181*  181*  BUN 49* 51* 55* 58* 63* 67*  68*  CREATININE 1.99* 2.13* 2.39* 2.74* 3.10* 3.57*  3.58*  CALCIUM 7.0* 7.0* 7.2* 7.2* 7.2* 7.4*  7.4*  MG 2.0  --   --   --   --   --   PHOS 4.6  --   --   --   --  6.0*   Liver Function Tests:  Recent Labs Lab 02/24/2017 1427 02/15/17 0847 02/16/17 0402 02/22/2017 0425  AST 35 24 21 13*  ALT 28 21 19  15*  ALKPHOS 92 46 37* 38  BILITOT 1.7* 2.4* 1.7* 1.2  PROT 4.6* 4.3* 4.7* 5.4*  ALBUMIN 1.2* 1.6* 1.9* 2.1*  2.1*   No results for input(s): LIPASE, AMYLASE in the last 168 hours. No results for input(s): AMMONIA in the last 168 hours. CBC:  Recent Labs Lab 02/15/17 0412 02/16/17 0402 02/17/17 0010 02/18/17 0425 2017-02-22 0425  WBC 7.3 6.5 8.8 8.2 8.5  HGB 9.2* 7.5* 7.7* 7.1* 6.5*  HCT 28.7* 24.5* 25.6* 24.4* 23.5*  MCV 89.7 90.7 90.8 95.3 98.7  PLT 263 214 250 218 207   Cardiac Enzymes: No results for input(s): CKTOTAL, CKMB, CKMBINDEX, TROPONINI in the last 168 hours. Sepsis Labs:  Recent Labs Lab 02/16/17 0402 02/17/17 0010 02/18/17 0425 2017/02/22 0425  WBC 6.5 8.8 8.2 8.5    Procedures/Operations    Exploratory laparotomy, lysis of adhesions, repair of multiple enterotomies 4 by Dr. Tama High on 02/03/2017  Exploratory laparotomy, partial small bowel resection 2 by Dr. Aviva Signs on 02/20/2017   Aviva Signs Feb 22, 2017, 10:36 PM

## 2017-02-27 NOTE — Progress Notes (Signed)
Pharmacy Antibiotic Note  Joshua Downs is a 83 y.o. male admitted on 01/14/2017 with multiple medical issues.  Pt now with oropharyngeal candidiasis.  Pt has worsening renal fxn.  Pharmacy has been consulted for DIFLUCAN dosing and Zosyn.  Worsening renal function.  Plan: Reduce Zosyn 2.25gm IV every 8 hours.  Continue Diflucan 50mg  PO daily. Monitor renal fxn, progress.  Height: 6' (182.9 cm) Weight: 201 lb 1 oz (91.2 kg) IBW/kg (Calculated) : 77.6  Temp (24hrs), Avg:97.6 F (36.4 C), Min:97.4 F (36.3 C), Max:97.8 F (36.6 C)   Recent Labs Lab 02/15/17 0412 02/15/17 0847 02/16/17 0402 02/17/17 0010 02/18/17 0425 2017-03-21 0425  WBC 7.3  --  6.5 8.8 8.2 8.5  CREATININE 1.99* 2.13* 2.39* 2.74* 3.10* 3.57*  3.58*    Estimated Creatinine Clearance: 16.9 mL/min (A) (by C-G formula based on SCr of 3.57 mg/dL (H)).    Allergies  Allergen Reactions  . No Known Allergies    Antimicrobials this admission:  Zosyn for possible IAI,  4/10 >> Diflucan 4/22 >>  Dose adjustments this admission:  n/a   Microbiology results:  4/18 MRSA PCR (-) 4/12 Abscess x 2: multiple species 4/4 MRSA PCR: (+)   Thank you for allowing pharmacy to be a part of this patient's care.  Pricilla Larsson 2017-03-21 10:32 AM

## 2017-02-27 NOTE — Progress Notes (Signed)
5 Days Post-Op  Subjective: Patient minimally responsive this morning. He does respond to pain. Labored breathing noted.  Objective: Vital signs in last 24 hours: Temp:  [97.4 F (36.3 C)-97.8 F (36.6 C)] 97.4 F (36.3 C) (04/23 0718) Resp:  [21] 21 (04/23 0718) SpO2:  [100 %] 100 % (04/23 0730) Weight:  [201 lb 1 oz (91.2 kg)] 201 lb 1 oz (91.2 kg) (04/23 0500) Last BM Date: 02/18/17  Intake/Output from previous day: 04/22 0701 - 04/23 0700 In: 1638.8 [I.V.:1488.8; IV Piggyback:150] Out: 80 [Drains:80] Intake/Output this shift: No intake/output data recorded.  General appearance: slowed mentation Resp: Bilateral coarse rhonchi noted. Respiratory effort somewhat shallow. Cardio: regular rate and rhythm, S1, S2 normal, no murmur, click, rub or gallop GI: Soft, nontender, nondistended. JP drainage serous in nature. Incision healing well. No purulent drainage noted.  Lab Results:   Recent Labs  02/18/17 0425 03/06/2017 0425  WBC 8.2 8.5  HGB 7.1* 6.5*  HCT 24.4* 23.5*  PLT 218 207   BMET  Recent Labs  02/18/17 0425 Mar 06, 2017 0425  NA 153* 149*  149*  K 4.1 3.9  3.9  CL 112* 106  107  CO2 36* 35*  34*  GLUCOSE 187* 181*  181*  BUN 63* 67*  68*  CREATININE 3.10* 3.57*  3.58*  CALCIUM 7.2* 7.4*  7.4*   PT/INR No results for input(s): LABPROT, INR in the last 72 hours.  Studies/Results: US Renal  Result Date: 02/18/2017 CLINICAL DATA:  Renal failure. History of a left partial nephrectomy. EXAM: RENAL / URINARY TRACT ULTRASOUND COMPLETE COMPARISON:  CT, 02/06/2017 FINDINGS: Right Kidney: Length: 9.2 cm. 17 mm upper pole cyst. Normal parenchymal echogenicity. No other renal masses, no stones and no hydronephrosis. Left Kidney: Length: 10.1 cm. Echogenicity within normal limits. No mass or hydronephrosis visualized. Bladder: Limited visualization.  No convincing mass or wall thickening. IMPRESSION: 1. No acute findings.  No hydronephrosis. 2. 17 mm right renal  cyst stable from the prior CT. Electronically Signed   By: Lajean Manes M.D.   On: 02/18/2017 12:59   Dg Chest Port 1 View  Result Date: 03-06-17 CLINICAL DATA:  Shortness of breath. EXAM: PORTABLE CHEST 1 VIEW COMPARISON:  02/15/2017 FINDINGS: Left PICC, left jugular catheter, and enteric tube have been removed. Right jugular catheter terminates over the lower SVC. Aortic atherosclerosis is noted. The cardiac silhouette remains enlarged. Lung volumes are diminished, more so than on the prior study. There is pulmonary vascular congestion with bilateral pleural effusions and bibasilar lung opacities likely reflecting atelectasis. Gas beneath the right hemidiaphragm is less conspicuous than on the prior study. No pneumothorax is seen. IMPRESSION: Diminished lung volumes with pulmonary vascular congestion, pleural effusions, and increased bibasilar atelectasis. Electronically Signed   By: Logan Bores M.D.   On: Mar 06, 2017 07:58    Anti-infectives: Anti-infectives    Start     Dose/Rate Route Frequency Ordered Stop   03-06-17 1000  fluconazole (DIFLUCAN) tablet 50 mg     50 mg Oral Daily 02/18/17 1106     02/18/17 1200  fluconazole (DIFLUCAN) IVPB 100 mg     100 mg 50 mL/hr over 60 Minutes Intravenous  Once 02/18/17 1053 02/18/17 1510   02/10/2017 1800  piperacillin-tazobactam (ZOSYN) IVPB 3.375 g     3.375 g 12.5 mL/hr over 240 Minutes Intravenous Every 8 hours 02/21/2017 1613     02/06/17 1700  piperacillin-tazobactam (ZOSYN) IVPB 3.375 g  Status:  Discontinued     3.375 g 12.5  mL/hr over 240 Minutes Intravenous Every 8 hours 02/06/17 1629 02/17/2017 1613   02/02/17 0900  cefTRIAXone (ROCEPHIN) 2 g in dextrose 5 % 50 mL IVPB     2 g 100 mL/hr over 30 Minutes Intravenous  Once 02/24/2017 1429 02/02/17 0930   01/28/2017 1730  metroNIDAZOLE (FLAGYL) IVPB 500 mg     500 mg 100 mL/hr over 60 Minutes Intravenous Every 8 hours 01/28/2017 1429 02/02/17 0325   02/20/2017 0822  cefTRIAXone (ROCEPHIN) 2 g in  dextrose 5 % 50 mL IVPB     2 g 100 mL/hr over 30 Minutes Intravenous On call to O.R. 02/02/2017 2979 01/29/2017 0920   02/11/2017 8921  metroNIDAZOLE (FLAGYL) IVPB 500 mg  Status:  Discontinued     500 mg 100 mL/hr over 60 Minutes Intravenous Every 8 hours 02/23/2017 0822 02/18/2017 1330      Assessment/Plan: s/p Procedure(s): EXPLORATORY LAPAROTOMY PARTIAL SMALL BOWEL RESECTION X 2 Impression: Patient's overall status has deteriorated overnight. Chest x-ray shows pulmonary congestion. His renal function has worsened. His mental status has worsened. Developing multiple organ failure. Family has made patient DO NOT RESUSCITATE, no chest compressions, intubation, or further surgery. I talked extensively with the son and told him that the situation looks critical and he may not survive over the next 24-48 hours. He understood. Will continue current care.  LOS: 26 days    Aviva Signs 02/24/17

## 2017-02-27 NOTE — Progress Notes (Signed)
Subjective: Interval History: has complaints difficulty in breathing. Patient presently on facemask oxygen..  Objective: Vital signs in last 24 hours: Temp:  [97.4 F (36.3 C)-97.8 F (36.6 C)] 97.4 F (36.3 C) (04/23 0718) Resp:  [21] 21 (04/23 0718) SpO2:  [100 %] 100 % (04/23 0730) Weight:  [91.2 kg (201 lb 1 oz)] 91.2 kg (201 lb 1 oz) (04/23 0500) Weight change:   Intake/Output from previous day: 04/22 0701 - 04/23 0700 In: 1638.8 [I.V.:1488.8; IV Piggyback:150] Out: 80 [Drains:80] Intake/Output this shift: No intake/output data recorded.  General appearance: alert and mild distress Resp: diminished breath sounds bilaterally and wheezes bilaterally Cardio: regular rate and rhythm Extremities: edema 2+ edema bilaterally  Lab Results:  Recent Labs  02/18/17 0425 2017/02/27 0425  WBC 8.2 8.5  HGB 7.1* 6.5*  HCT 24.4* 23.5*  PLT 218 207   BMET:  Recent Labs  02/18/17 0425 02/27/2017 0425  NA 153* 149*  149*  K 4.1 3.9  3.9  CL 112* 106  107  CO2 36* 35*  34*  GLUCOSE 187* 181*  181*  BUN 63* 67*  68*  CREATININE 3.10* 3.57*  3.58*  CALCIUM 7.2* 7.4*  7.4*   No results for input(s): PTH in the last 72 hours. Iron Studies: No results for input(s): IRON, TIBC, TRANSFERRIN, FERRITIN in the last 72 hours.  Studies/Results: US Renal  Result Date: 02/18/2017 CLINICAL DATA:  Renal failure. History of a left partial nephrectomy. EXAM: RENAL / URINARY TRACT ULTRASOUND COMPLETE COMPARISON:  CT, 02/06/2017 FINDINGS: Right Kidney: Length: 9.2 cm. 17 mm upper pole cyst. Normal parenchymal echogenicity. No other renal masses, no stones and no hydronephrosis. Left Kidney: Length: 10.1 cm. Echogenicity within normal limits. No mass or hydronephrosis visualized. Bladder: Limited visualization.  No convincing mass or wall thickening. IMPRESSION: 1. No acute findings.  No hydronephrosis. 2. 17 mm right renal cyst stable from the prior CT. Electronically Signed   By: Lajean Manes M.D.   On: 02/18/2017 12:59   Dg Chest Port 1 View  Result Date: 2017/02/27 CLINICAL DATA:  Shortness of breath. EXAM: PORTABLE CHEST 1 VIEW COMPARISON:  02/15/2017 FINDINGS: Left PICC, left jugular catheter, and enteric tube have been removed. Right jugular catheter terminates over the lower SVC. Aortic atherosclerosis is noted. The cardiac silhouette remains enlarged. Lung volumes are diminished, more so than on the prior study. There is pulmonary vascular congestion with bilateral pleural effusions and bibasilar lung opacities likely reflecting atelectasis. Gas beneath the right hemidiaphragm is less conspicuous than on the prior study. No pneumothorax is seen. IMPRESSION: Diminished lung volumes with pulmonary vascular congestion, pleural effusions, and increased bibasilar atelectasis. Electronically Signed   By: Logan Bores M.D.   On: 2017-02-27 07:58    I have reviewed the patient's current medications.  Assessment/Plan: Problem #1 acute kidney injury superimposed on chronic. Presently is BUN and  creatinine is increasing. Patient is oliguric. Problem #2 difficulty breathing: Possibly combination of fluid overload/pleural effusion. Presently on Lasix but he is oliguric. Problem #3 small bowel obstruction, status post partial small bowel resection Problem #4 hypernatremia: Presently on D5 water. His sodium is 149 improving. Problem #5 hypotension: His blood pressure ranges in high 80s and low 90s. Problem #6 anasarca Problem #7 anemia: His hemoglobin is low and declining Plan: Will change patient Lasix to continuous  IV drip 2] at this moment because of his hypotension hemodialysis seems to be difficult. However if possible transfer for inpatient for CVVHD may be reasonable.  3] we'll check his CBC and renal panel in the morning.    LOS: 26 days   Davielle Lingelbach S Mar 19, 2017,9:16 AM

## 2017-02-27 NOTE — Progress Notes (Signed)
NRB placed per V.O. Dr Arnoldo Morale. PCXR ordered.

## 2017-02-27 NOTE — Progress Notes (Signed)
Body released to [employer] @ 4159.

## 2017-02-27 NOTE — Progress Notes (Signed)
Onset of bradycardia and periods apnea noted @1450 , then cessation of respirations. No cardiac activity verified by RN x2. Time of death 1455. Family at bedside. MD notified and made aware. [facility] notified, bed placement notified.

## 2017-02-27 DEATH — deceased

## 2018-07-14 IMAGING — CR DG CHEST 1V PORT
1 series · 1 of 1 positions shown · non-contrast
Comparison: Chest radiograph performed 01/24/2017

CLINICAL DATA: Nasogastric tube placement.  Initial encounter.

EXAM:
PORTABLE CHEST 1 VIEW

[portable]
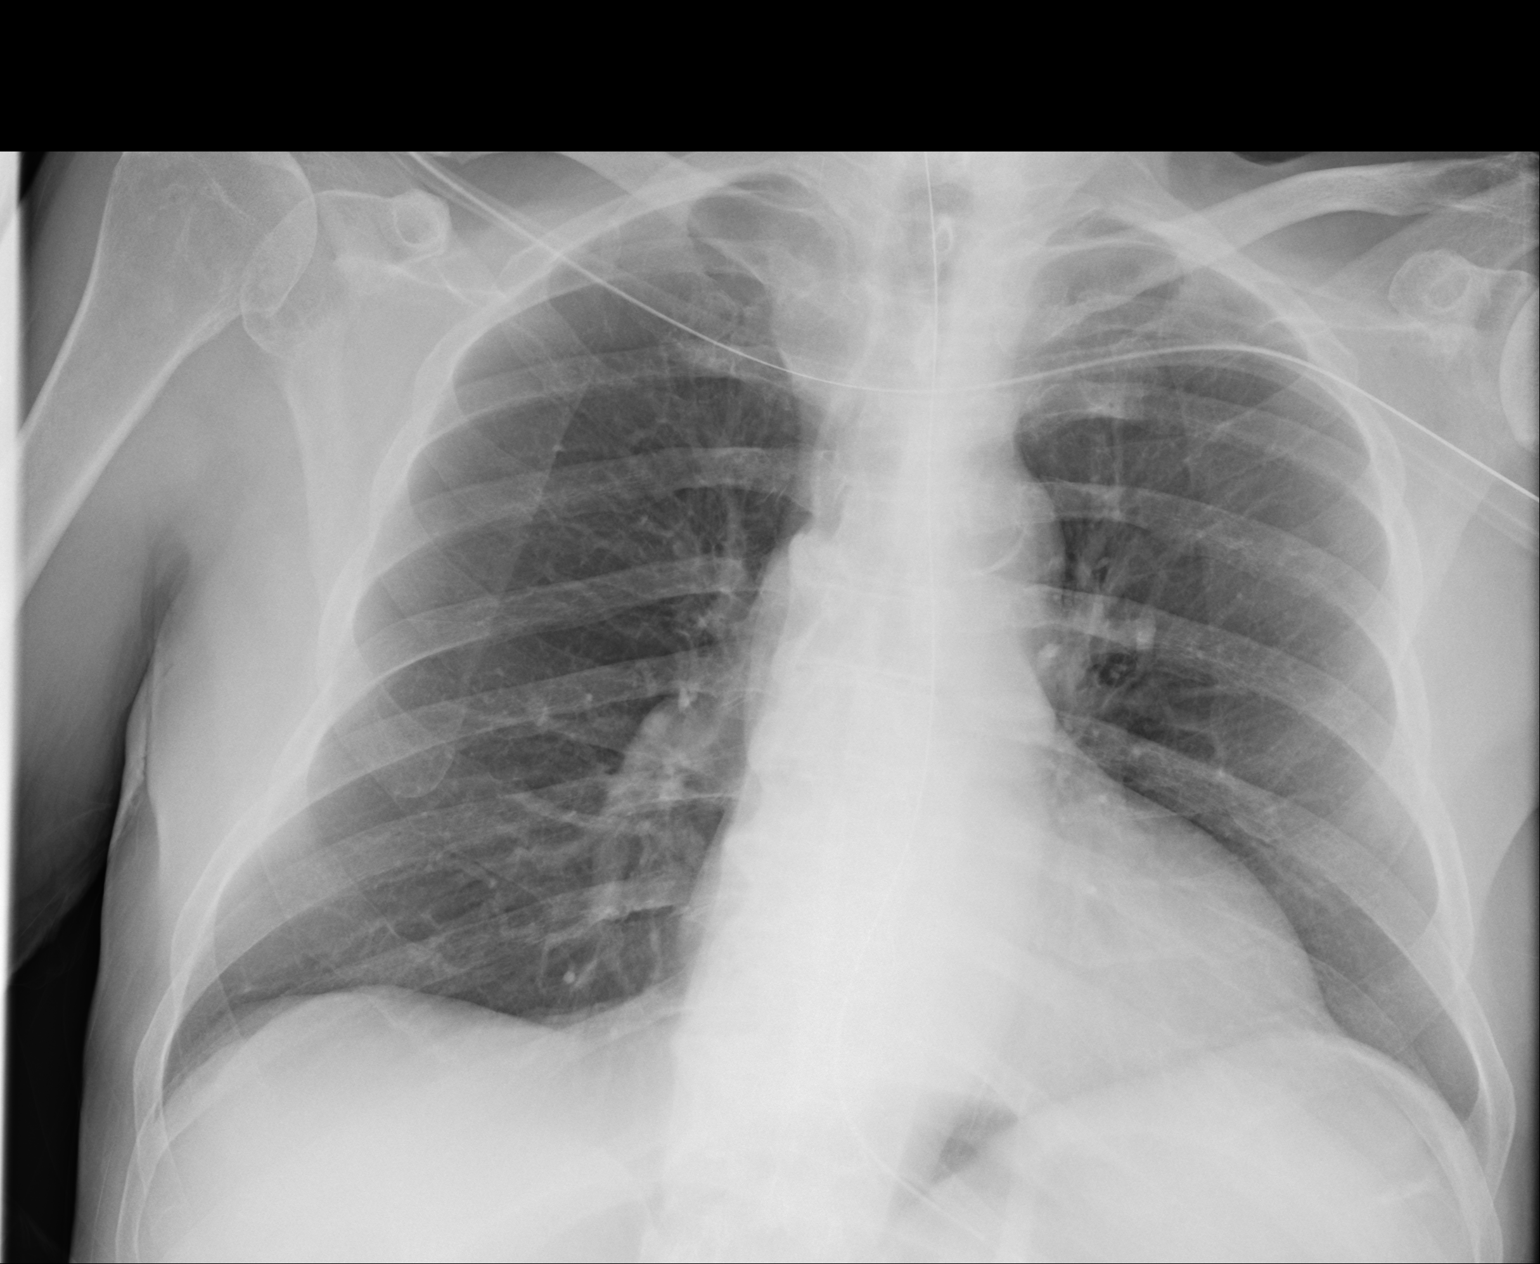

[1 of 1 positions shown; findings below may reference images not displayed]

FINDINGS: The patient's enteric tube is seen extending below the diaphragm.

The lungs are well-aerated and clear. There is no evidence of focal
opacification, pleural effusion or pneumothorax.

The cardiomediastinal silhouette is within normal limits. No acute
osseous abnormalities are seen.
IMPRESSION: Enteric tube noted extending below the diaphragm.

## 2018-07-14 IMAGING — CR DG ABD PORTABLE 2V
1 series · 2 of 2 positions shown · non-contrast
Comparison: Yesterday.

CLINICAL DATA: Followup small bowel obstruction.

EXAM:
PORTABLE ABDOMEN - 2 VIEW

[Series 1: supine ap · 0.17mm/px · 2 of 2 slices shown]
[im 1/2]
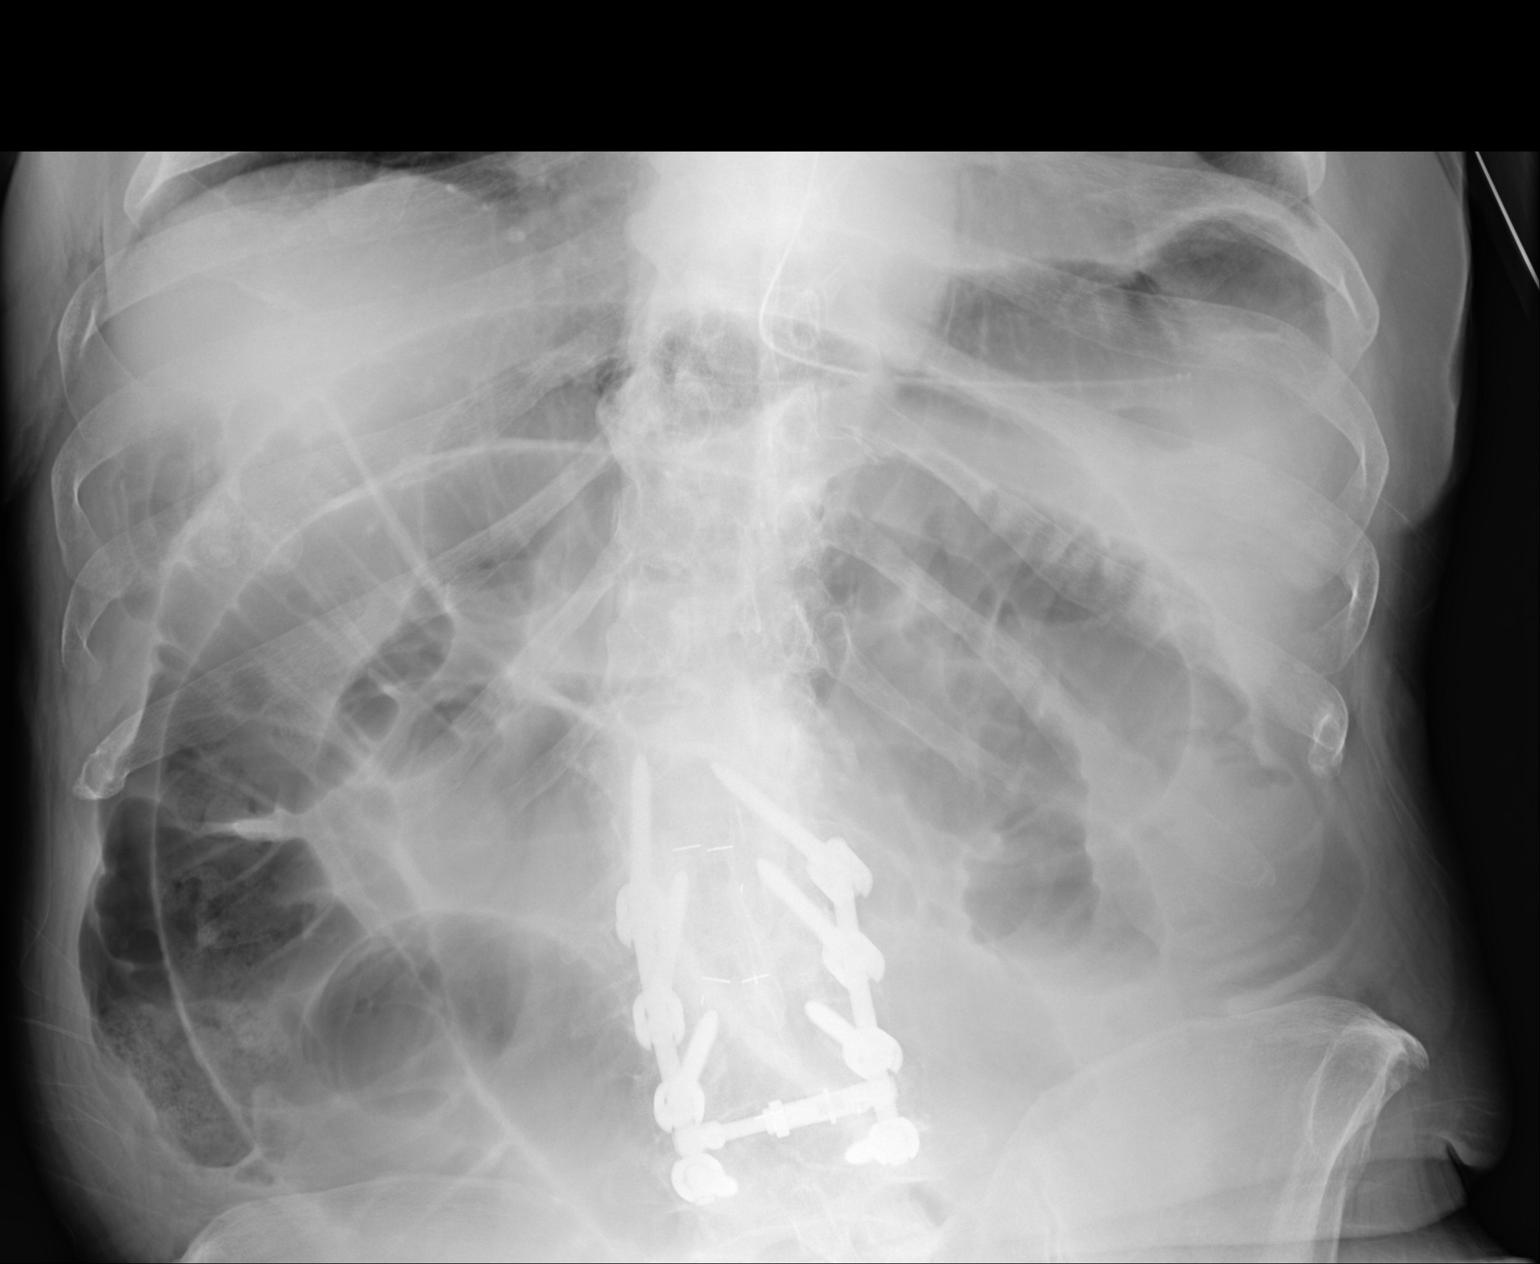
[im 2/2]
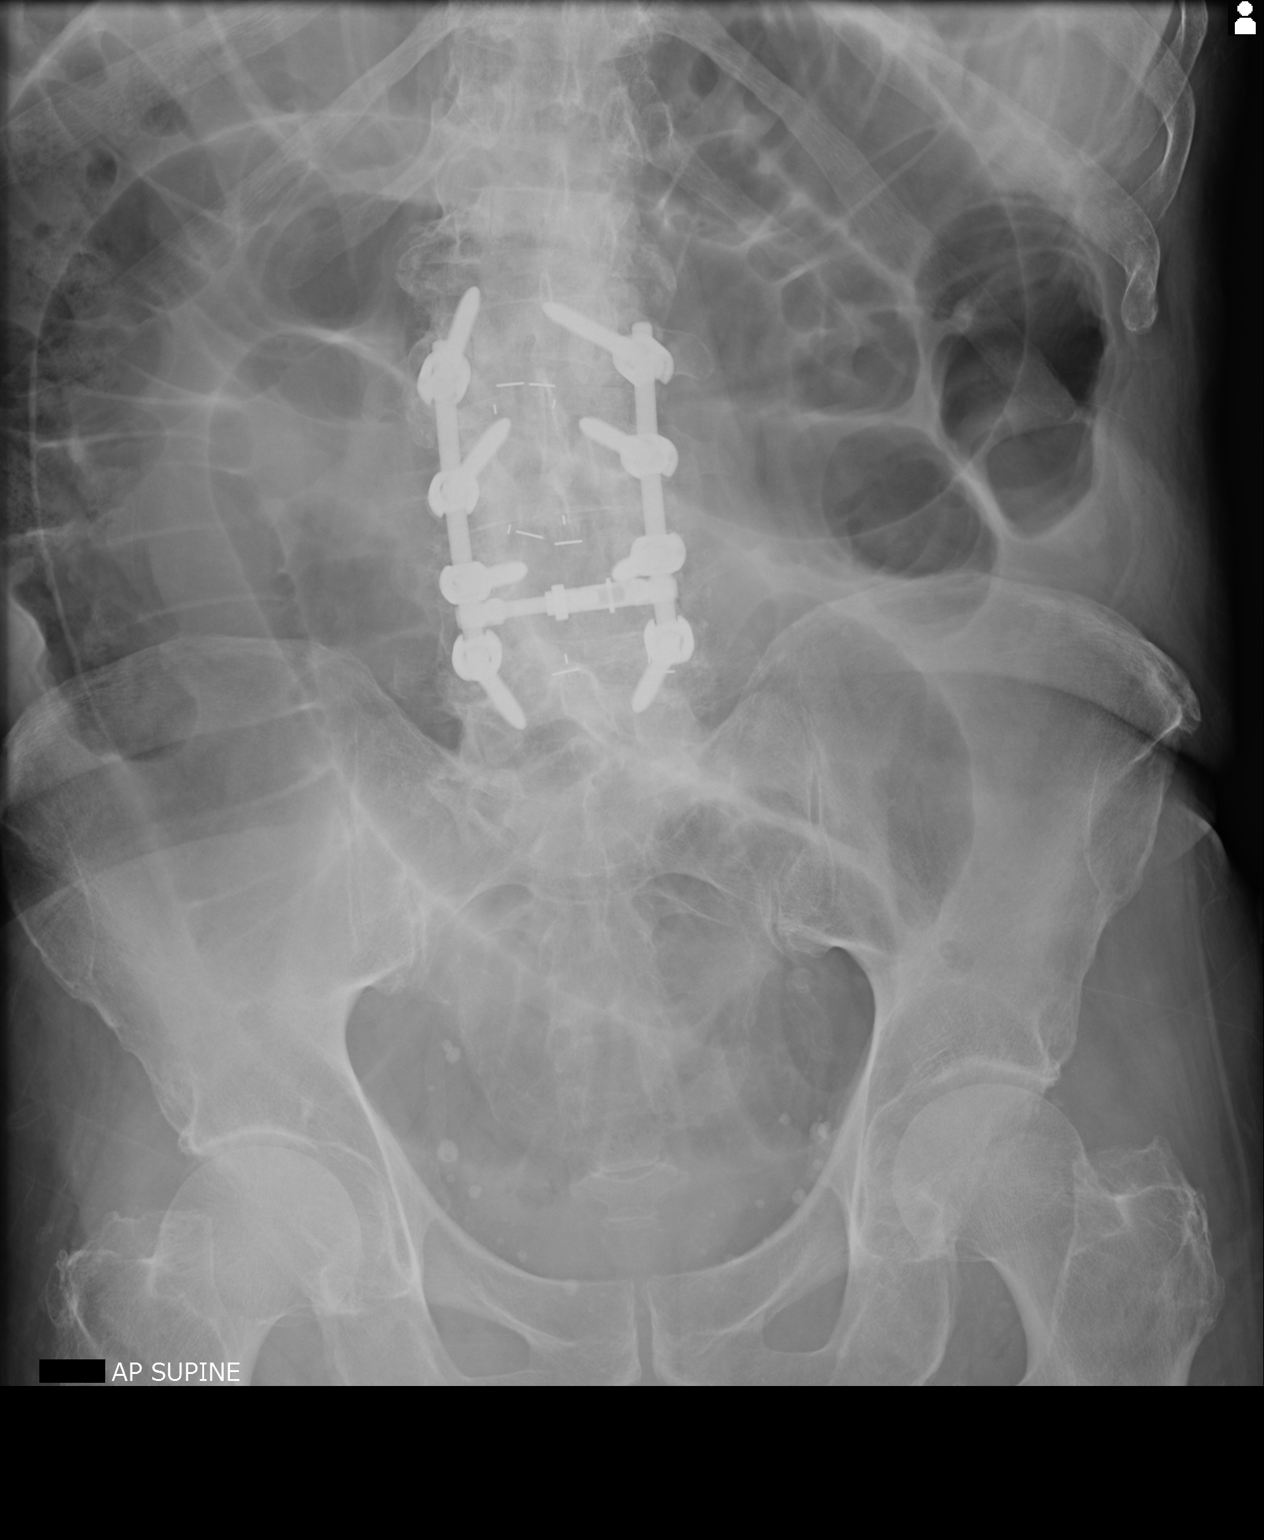

[2 of 2 positions shown; findings below may reference images not displayed]

FINDINGS: Multiple dilated small bowel loops with a mild decrease in caliber.
The maximum small bowel diameter is currently 6.4 cm. No bowel wall
thickening or pneumatosis seen. No free peritoneal air.

Nasogastric tube tip and side hole in the proximal stomach. Post
laminectomy changes and fixation hardware in the lumbar spine.
Thoracolumbar spine degenerative changes and scoliosis.
IMPRESSION: Mild improved small-bowel obstruction.

## 2018-07-18 IMAGING — CT CT ABD-PELV W/ CM
2 of 5 series · 15 of 46 positions shown, 17 images · IV contrast (Isovue)
Comparison: 01/24/2017

CLINICAL DATA: Left renal carcinoma [id number] with partial nephrectomy.
Severe small-bowel obstruction, nausea and vomiting.

EXAM:
CT ABDOMEN AND PELVIS WITH CONTRAST
TECHNIQUE: Multidetector CT imaging of the abdomen and pelvis was performed
using the standard protocol following bolus administration of
intravenous contrast.
CONTRAST:  100mL PCSNWO-D77 IOPAMIDOL (PCSNWO-D77) INJECTION 61%

[Series 2: axial st · axial · 0.77mm/px · z∈[-713,-313]mm · 12 of 92 slices shown, 14 images]
[im 6/92  soft-tissue]
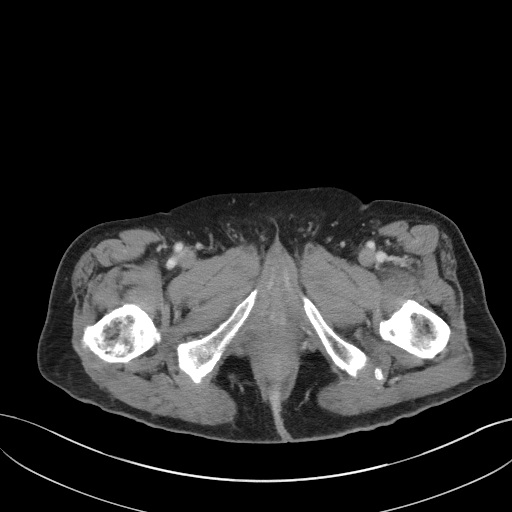
[im 6/92  bone]
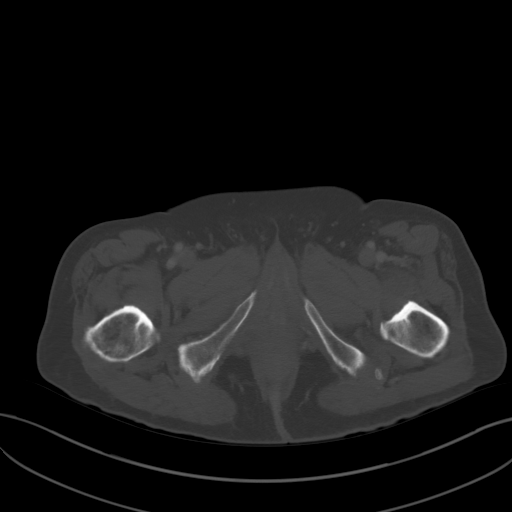
[im 17/92  soft-tissue]
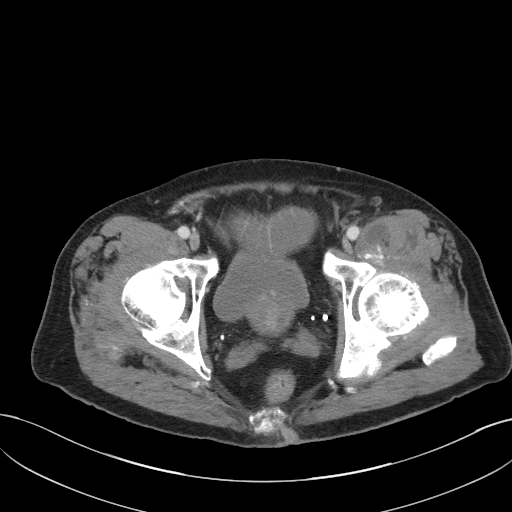
[im 22/92  soft-tissue]
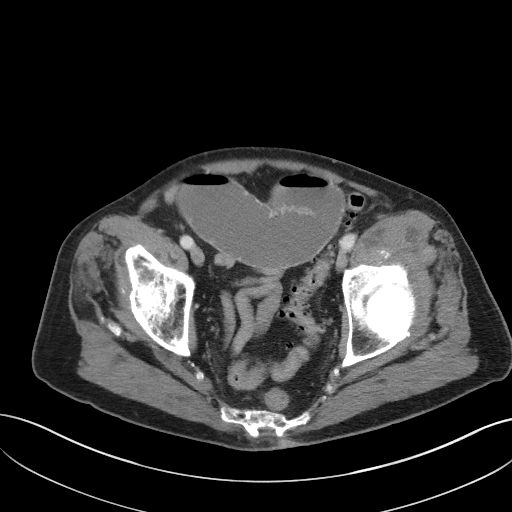
[im 27/92  soft-tissue]
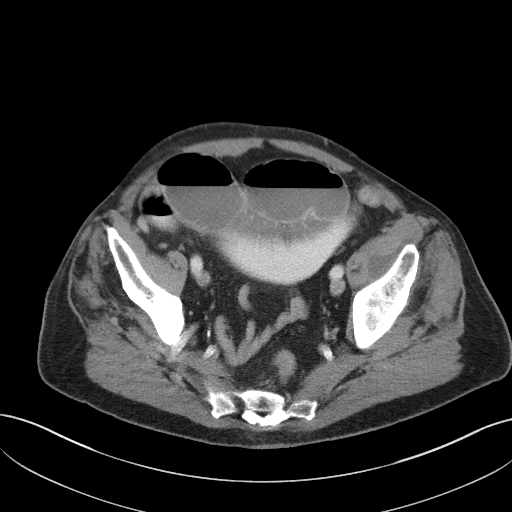
[im 38/92  soft-tissue]
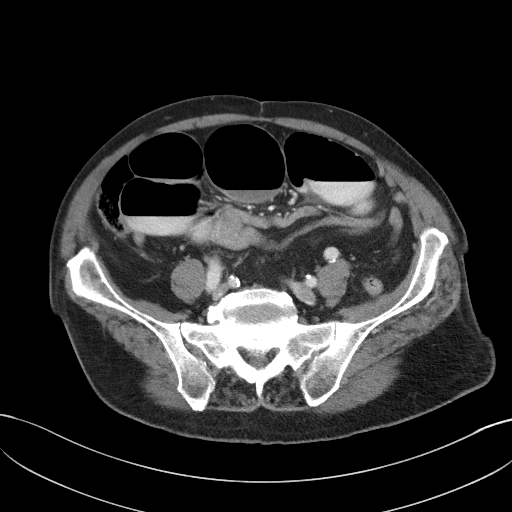
[im 43/92  soft-tissue]
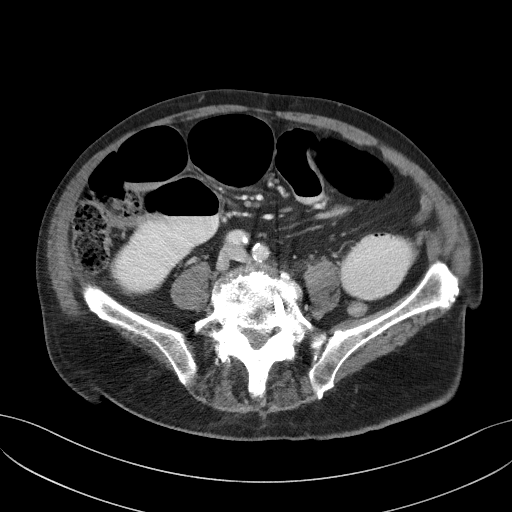
[im 49/92  soft-tissue]
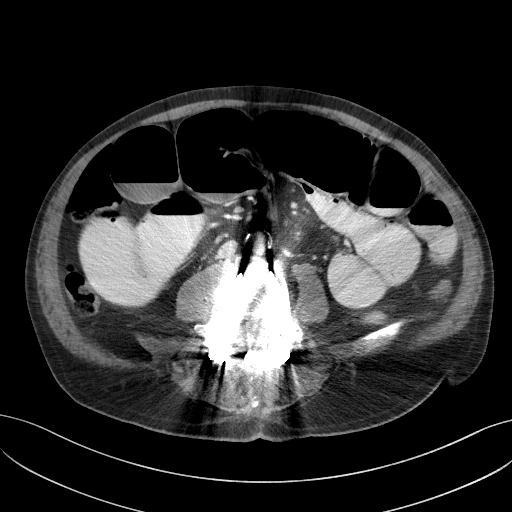
[im 59/92  soft-tissue]
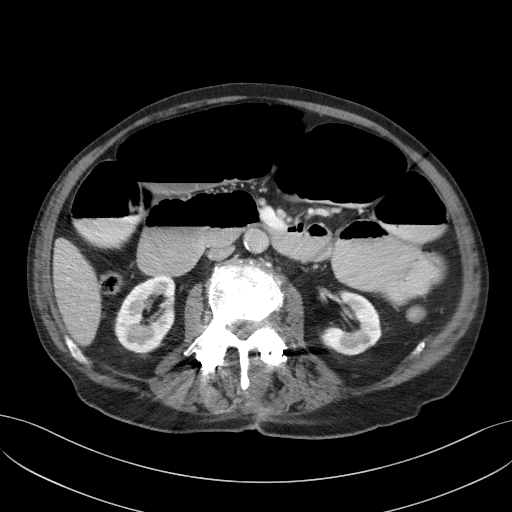
[im 65/92  soft-tissue]
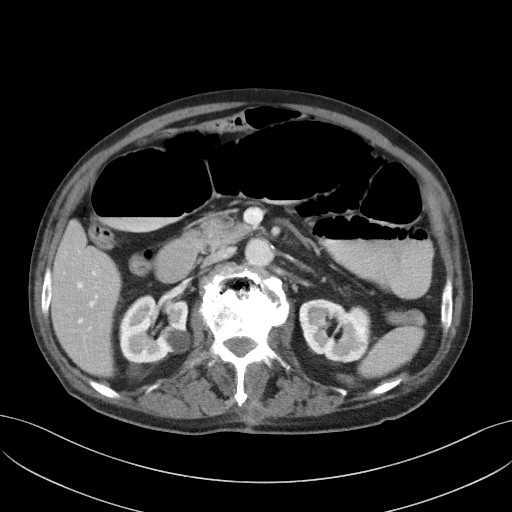
[im 65/92  bone]
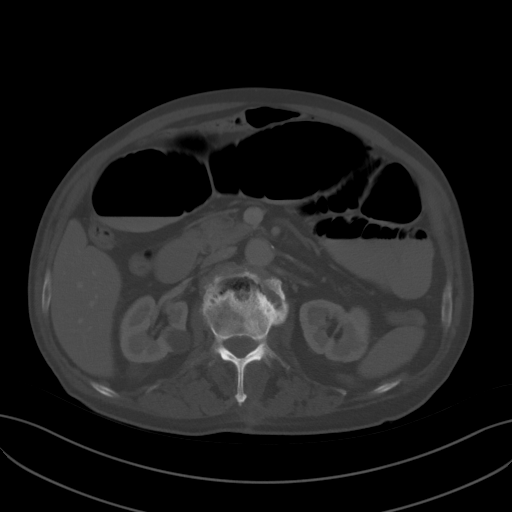
[im 70/92  soft-tissue]
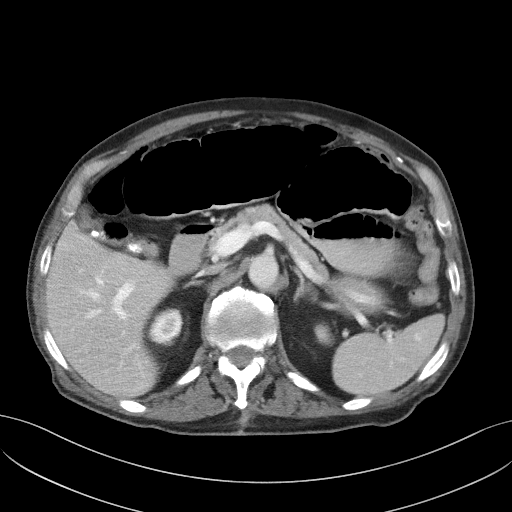
[im 81/92  soft-tissue]
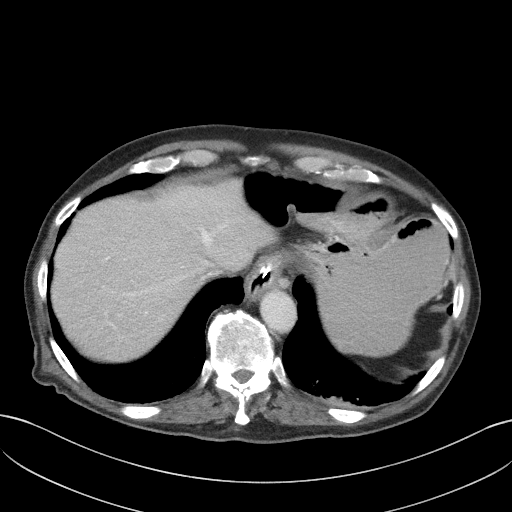
[im 86/92  soft-tissue]
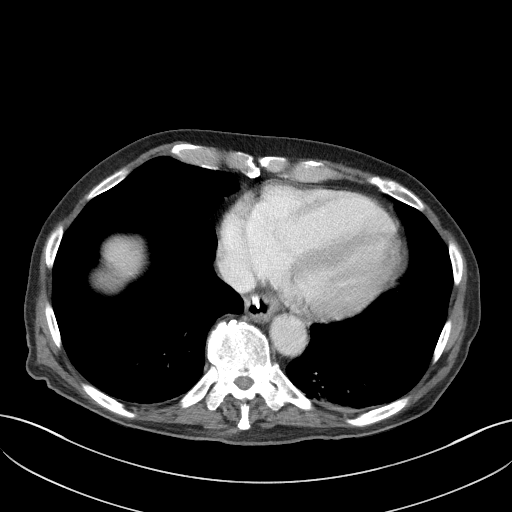

[Series 6: coronal st · coronal · 0.71mm/px · 3 of 93 slices shown]
[im 31/93  soft-tissue]
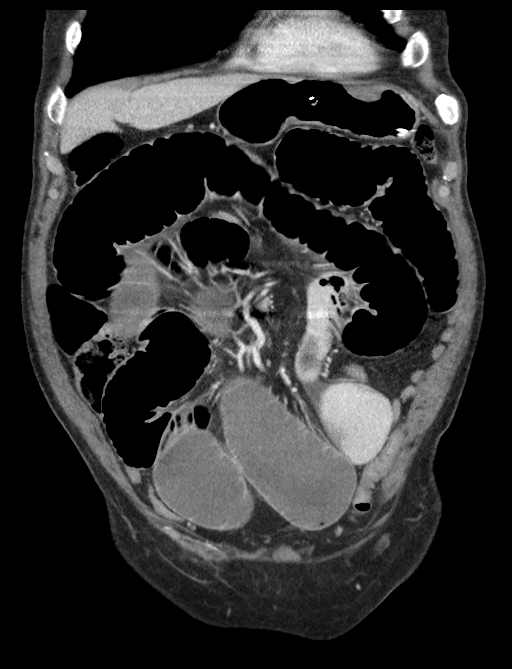
[im 41/93  soft-tissue]
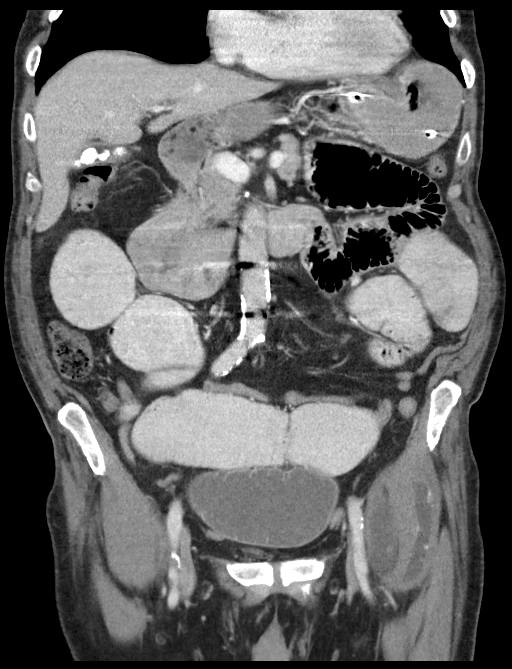
[im 52/93  soft-tissue]
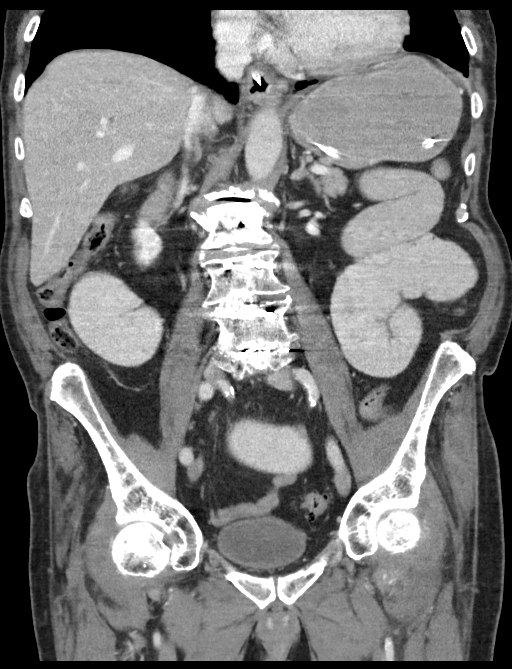

[15 of 46 positions shown; findings below may reference images not displayed]

FINDINGS: Lower chest: Top normal size cardiac chambers. No pericardial
effusion. Left basilar atelectasis with minimal bronchiectasis.

Hepatobiliary: Homogeneous attenuation of the liver without
space-occupying mass. Nondistended gallbladder with gallstones.

Pancreas: Unremarkable. No pancreatic ductal dilatation or
surrounding inflammatory changes.

Spleen: Normal in size without focal abnormality.

Adrenals/Urinary Tract: Stable right-sided renal cysts. Normal
appearing adrenal glands. No obstructive uropathy. Unremarkable
urinary bladder. No solid enhancing renal mass in either kidney.

Stomach/Bowel: Gastric tube is seen within fluid-filled distended
stomach, coiled back upon itself within the gastric antrum. Markedly
dilated air and fluid-filled small bowel loops measuring up to
cm are again identified a transition point in the mid pelvis similar
to that seen previously possibly related to an adhesion. Mid to
distal ileal loops superior contracted. Large bowel is nondistended.
There is no free air.

Vascular/Lymphatic: Aortoiliac atherosclerosis. Opacification of the
celiac axis, SMA and IMA.

Reproductive: Mildly enlarged prostate up to 5.8 cm in maximum
dimension.

Other: No free fluid.

Musculoskeletal: Left iliopsoas bursal fluid containing ill-defined
a amorphous calcifications possibly representing changes of
osteochondral bodies from the left hip. Bilateral axial joint space
narrowing of the hips. Postop change of the lumbar spine.
IMPRESSION: 1. Relatively unchanged appearance of high-grade small bowel
obstruction to the level of the mid ileum. Transition zone is less
well-defined on current exam but noted on prior study to be within
the mid upper pelvis. Gastric tube is seen within distended stomach
slightly coiled back upon itself within the antrum.
2. Stable right-sided renal cysts.
3. Left iliopsoas bursal fluid with calcifications possibly
representing osteochondral bodies.
4. Aortoiliac atherosclerosis.
